# Patient Record
Sex: Female | Born: 1935
Health system: Southern US, Community
[De-identification: ages and names within clinical notes are randomized; demographics above are authoritative.]

## PROBLEM LIST (undated history)

## (undated) DIAGNOSIS — H269 Unspecified cataract: Secondary | ICD-10-CM

## (undated) DIAGNOSIS — C801 Malignant (primary) neoplasm, unspecified: Secondary | ICD-10-CM

## (undated) DIAGNOSIS — E119 Type 2 diabetes mellitus without complications: Secondary | ICD-10-CM

## (undated) DIAGNOSIS — K219 Gastro-esophageal reflux disease without esophagitis: Secondary | ICD-10-CM

## (undated) DIAGNOSIS — I10 Essential (primary) hypertension: Secondary | ICD-10-CM

## (undated) DIAGNOSIS — M1990 Unspecified osteoarthritis, unspecified site: Secondary | ICD-10-CM

## (undated) HISTORY — DX: Unspecified osteoarthritis, unspecified site: M19.90

## (undated) HISTORY — DX: Type 2 diabetes mellitus without complications: E11.9

## (undated) HISTORY — PX: ABDOMINAL HYSTERECTOMY: SHX81

## (undated) HISTORY — DX: Malignant (primary) neoplasm, unspecified: C80.1

## (undated) HISTORY — PX: SPINE SURGERY: SHX786

## (undated) HISTORY — DX: Unspecified cataract: H26.9

## (undated) HISTORY — DX: Gastro-esophageal reflux disease without esophagitis: K21.9

## (undated) HISTORY — PX: EYE SURGERY: SHX253

## (undated) HISTORY — PX: TONSILLECTOMY: SUR1361

---

## 1999-10-16 HISTORY — PX: OTHER SURGICAL HISTORY: SHX169

## 2006-12-25 ENCOUNTER — Ambulatory Visit: Payer: Self-pay | Admitting: Family Medicine

## 2007-02-03 ENCOUNTER — Encounter: Payer: Self-pay | Admitting: Family Medicine

## 2007-02-03 ENCOUNTER — Other Ambulatory Visit: Admission: RE | Admit: 2007-02-03 | Discharge: 2007-02-03 | Payer: Self-pay | Admitting: Family Medicine

## 2007-02-03 ENCOUNTER — Ambulatory Visit: Payer: Self-pay | Admitting: Family Medicine

## 2007-02-03 DIAGNOSIS — N63 Unspecified lump in unspecified breast: Secondary | ICD-10-CM | POA: Insufficient documentation

## 2007-02-03 DIAGNOSIS — E7800 Pure hypercholesterolemia, unspecified: Secondary | ICD-10-CM | POA: Insufficient documentation

## 2007-02-03 DIAGNOSIS — R030 Elevated blood-pressure reading, without diagnosis of hypertension: Secondary | ICD-10-CM | POA: Insufficient documentation

## 2007-02-03 DIAGNOSIS — N951 Menopausal and female climacteric states: Secondary | ICD-10-CM | POA: Insufficient documentation

## 2007-02-04 ENCOUNTER — Encounter: Payer: Self-pay | Admitting: Family Medicine

## 2007-02-04 LAB — CONVERTED CEMR LAB
ALT: 15 units/L (ref 0–35)
BUN: 13 mg/dL (ref 6–23)
CO2: 26 meq/L (ref 19–32)
Calcium: 8.9 mg/dL (ref 8.4–10.5)
Cholesterol: 198 mg/dL (ref 0–200)
Creatinine, Ser: 0.73 mg/dL (ref 0.40–1.20)
Glucose, Bld: 138 mg/dL — ABNORMAL HIGH (ref 70–99)
HDL: 40 mg/dL (ref >39)
Total Bilirubin: 0.4 mg/dL (ref 0.3–1.2)
Total CHOL/HDL Ratio: 5
Triglycerides: 184 mg/dL — ABNORMAL HIGH (ref <150)
VLDL: 37 mg/dL (ref 0–40)

## 2007-02-05 ENCOUNTER — Encounter: Payer: Self-pay | Admitting: Family Medicine

## 2007-02-11 ENCOUNTER — Encounter: Admission: RE | Admit: 2007-02-11 | Discharge: 2007-02-11 | Payer: Self-pay | Admitting: Family Medicine

## 2007-02-19 ENCOUNTER — Ambulatory Visit: Payer: Self-pay | Admitting: Family Medicine

## 2007-02-19 DIAGNOSIS — E118 Type 2 diabetes mellitus with unspecified complications: Secondary | ICD-10-CM | POA: Insufficient documentation

## 2007-02-19 DIAGNOSIS — E119 Type 2 diabetes mellitus without complications: Secondary | ICD-10-CM

## 2007-03-06 ENCOUNTER — Encounter: Admission: RE | Admit: 2007-03-06 | Discharge: 2007-06-04 | Payer: Self-pay | Admitting: Family Medicine

## 2007-03-13 ENCOUNTER — Ambulatory Visit: Payer: Self-pay | Admitting: Family Medicine

## 2007-04-21 ENCOUNTER — Ambulatory Visit: Payer: Self-pay | Admitting: Family Medicine

## 2007-04-21 LAB — CONVERTED CEMR LAB

## 2007-06-09 ENCOUNTER — Ambulatory Visit: Payer: Self-pay | Admitting: Family Medicine

## 2007-08-12 ENCOUNTER — Ambulatory Visit: Payer: Self-pay | Admitting: Family Medicine

## 2007-09-05 ENCOUNTER — Encounter: Payer: Self-pay | Admitting: Family Medicine

## 2008-02-04 ENCOUNTER — Ambulatory Visit: Payer: Self-pay | Admitting: Family Medicine

## 2008-02-04 LAB — CONVERTED CEMR LAB
Glucose, Bld: 95 mg/dL
Microalbumin U total vol: 10 mg/L

## 2008-03-09 ENCOUNTER — Encounter: Admission: RE | Admit: 2008-03-09 | Discharge: 2008-03-09 | Payer: Self-pay | Admitting: Family Medicine

## 2008-03-25 ENCOUNTER — Ambulatory Visit: Payer: Self-pay | Admitting: Family Medicine

## 2008-03-25 DIAGNOSIS — L03019 Cellulitis of unspecified finger: Secondary | ICD-10-CM | POA: Insufficient documentation

## 2008-08-06 ENCOUNTER — Ambulatory Visit: Payer: Self-pay | Admitting: Family Medicine

## 2008-08-10 ENCOUNTER — Encounter: Payer: Self-pay | Admitting: Family Medicine

## 2008-08-11 LAB — CONVERTED CEMR LAB
Alkaline Phosphatase: 81 units/L (ref 39–117)
BUN: 12 mg/dL (ref 6–23)
CO2: 25 meq/L (ref 19–32)
Cholesterol: 195 mg/dL (ref 0–200)
Glucose, Bld: 117 mg/dL — ABNORMAL HIGH (ref 70–99)
HDL: 41 mg/dL (ref >39)
LDL Cholesterol: 125 mg/dL — ABNORMAL HIGH (ref 0–99)
Total Bilirubin: 0.4 mg/dL (ref 0.3–1.2)
Total Protein: 6.7 g/dL (ref 6.0–8.3)
Triglycerides: 145 mg/dL (ref <150)
VLDL: 29 mg/dL (ref 0–40)

## 2008-08-30 ENCOUNTER — Telehealth: Payer: Self-pay | Admitting: Family Medicine

## 2008-09-06 ENCOUNTER — Telehealth: Payer: Self-pay | Admitting: Family Medicine

## 2008-11-05 ENCOUNTER — Ambulatory Visit: Payer: Self-pay | Admitting: Family Medicine

## 2009-02-03 ENCOUNTER — Encounter: Payer: Self-pay | Admitting: Family Medicine

## 2009-02-03 LAB — HM DIABETES EYE EXAM: HM Diabetic Eye Exam: NORMAL

## 2009-02-07 ENCOUNTER — Ambulatory Visit: Payer: Self-pay | Admitting: Family Medicine

## 2009-02-07 DIAGNOSIS — R21 Rash and other nonspecific skin eruption: Secondary | ICD-10-CM | POA: Insufficient documentation

## 2009-02-07 LAB — HM DIABETES FOOT EXAM

## 2009-02-07 LAB — CONVERTED CEMR LAB
Creatinine, Urine: 26.2 mg/dL
Microalb Creat Ratio: 19.1 mg/g (ref 0.0–30.0)

## 2009-02-08 ENCOUNTER — Encounter: Payer: Self-pay | Admitting: Family Medicine

## 2009-02-09 LAB — CONVERTED CEMR LAB
ALT: 14 units/L (ref 0–35)
AST: 15 units/L (ref 0–37)
Albumin: 4.2 g/dL (ref 3.5–5.2)
BUN: 13 mg/dL (ref 6–23)
CO2: 24 meq/L (ref 19–32)
Calcium: 9.2 mg/dL (ref 8.4–10.5)
Chloride: 103 meq/L (ref 96–112)
Cholesterol: 197 mg/dL (ref 0–200)
Creatinine, Ser: 0.66 mg/dL (ref 0.40–1.20)
Potassium: 4.5 meq/L (ref 3.5–5.3)

## 2009-04-04 ENCOUNTER — Telehealth (INDEPENDENT_AMBULATORY_CARE_PROVIDER_SITE_OTHER): Payer: Self-pay | Admitting: *Deleted

## 2009-04-04 ENCOUNTER — Encounter: Admission: RE | Admit: 2009-04-04 | Discharge: 2009-04-04 | Payer: Self-pay | Admitting: Family Medicine

## 2009-06-09 ENCOUNTER — Ambulatory Visit: Payer: Self-pay | Admitting: Family Medicine

## 2009-06-09 LAB — CONVERTED CEMR LAB
Hgb A1c MFr Bld: 6.7 %
Hgb A1c MFr Bld: 6.7 %
Microalbumin U total vol: 30 mg/L

## 2009-06-23 ENCOUNTER — Encounter: Payer: Self-pay | Admitting: Family Medicine

## 2009-06-27 LAB — CONVERTED CEMR LAB
ALT: 16 units/L (ref 0–35)
AST: 19 units/L (ref 0–37)
Alkaline Phosphatase: 83 units/L (ref 39–117)
Cholesterol: 138 mg/dL (ref 0–200)
Creatinine, Ser: 0.69 mg/dL (ref 0.40–1.20)
LDL Cholesterol: 57 mg/dL (ref 0–99)
Sodium: 141 meq/L (ref 135–145)
Total Bilirubin: 0.4 mg/dL (ref 0.3–1.2)
Total CHOL/HDL Ratio: 3.3
Total Protein: 7 g/dL (ref 6.0–8.3)
VLDL: 39 mg/dL (ref 0–40)

## 2009-07-07 ENCOUNTER — Encounter: Payer: Self-pay | Admitting: Family Medicine

## 2009-09-06 ENCOUNTER — Ambulatory Visit: Payer: Self-pay | Admitting: Family Medicine

## 2009-09-12 ENCOUNTER — Ambulatory Visit: Payer: Self-pay | Admitting: Family Medicine

## 2009-09-12 LAB — CONVERTED CEMR LAB: Hgb A1c MFr Bld: 6.3 %

## 2009-12-20 ENCOUNTER — Ambulatory Visit: Payer: Self-pay | Admitting: Family Medicine

## 2009-12-20 LAB — CONVERTED CEMR LAB: Hgb A1c MFr Bld: 6.1 %

## 2010-06-30 ENCOUNTER — Ambulatory Visit: Payer: Self-pay | Admitting: Family Medicine

## 2010-06-30 DIAGNOSIS — J069 Acute upper respiratory infection, unspecified: Secondary | ICD-10-CM | POA: Insufficient documentation

## 2010-06-30 LAB — CONVERTED CEMR LAB: Hgb A1c MFr Bld: 6.5 %

## 2010-07-31 ENCOUNTER — Telehealth: Payer: Self-pay | Admitting: Family Medicine

## 2010-09-28 ENCOUNTER — Encounter: Payer: Self-pay | Admitting: Family Medicine

## 2010-10-02 LAB — CONVERTED CEMR LAB
Albumin: 4.3 g/dL (ref 3.5–5.2)
Alkaline Phosphatase: 68 units/L (ref 39–117)
BUN: 13 mg/dL (ref 6–23)
Creatinine, Ser: 0.65 mg/dL (ref 0.40–1.20)
Glucose, Bld: 106 mg/dL — ABNORMAL HIGH (ref 70–99)
HDL: 47 mg/dL (ref >39)
LDL Cholesterol: 95 mg/dL (ref 0–99)
Potassium: 4.4 meq/L (ref 3.5–5.3)
Total CHOL/HDL Ratio: 3.4
Triglycerides: 102 mg/dL (ref <150)

## 2010-11-05 ENCOUNTER — Encounter: Payer: Self-pay | Admitting: Family Medicine

## 2010-11-14 NOTE — Assessment & Plan Note (Signed)
Summary: URI, DM   Vital Signs:  Patient profile:   77 year old female Height:      66.5 inches Weight:      240 pounds O2 Sat:      96 % on Room air Temp:     98.3 degrees F oral Pulse rate:   87 / minute BP sitting:   133 / 61  (left arm) Cuff size:   large  Vitals Entered By: Kathlene November (June 30, 2010 10:09 AM)  O2 Flow:  Room air CC: head congestion, drainage, cough x 3 days- been using Nyquil and Zycam- feels better today Flu Vaccine Consent Questions     Do you have a history of severe allergic reactions to this vaccine? no    Any prior history of allergic reactions to egg and/or gelatin? no    Do you have a sensitivity to the preservative Thimersol? no    Do you have a past history of Guillan-Barre Syndrome? no    Do you currently have an acute febrile illness? no    Have you ever had a severe reaction to latex? no    Vaccine information given and explained to patient? yes    Are you currently pregnant? no    Lot Number:[id number]   Exp Date:04/14/2011   Site Given  Left Deltoid IM   Primary Care Provider:  Linford Arnold, C  CC:  head congestion, drainage, and cough x 3 days- been using Nyquil and Zycam- feels better today.  History of Present Illness: head congestion, drainage, cough x 3 days- been using Nyquil and Zycam- feels better today. No SOB or chest pain. Feels sore in her chest. No fever. No GI sxs. No alleviating or exacerbating sxs.   Diabetes Management History:      The patient is a 77 years old female who comes in for evaluation of Type 2 Diabetes Mellitus.  She is (or has been) enrolled in the "Diabetic Education Program".  She states understanding of dietary principles and is following her diet appropriately.  Self foot exams are being performed.  She is checking home blood sugars.  She says that she is not exercising regularly.        Hypoglycemic symptoms are not occurring.  No hyperglycemic symptoms are reported.  Other comments include: Fasting  sugars in July 110-130.  In August sugars 140-150. Marland Kitchen        There are no symptoms to suggest diabetic complications.  No changes have been made to her treatment plan since last visit.    Current Medications (verified): 1)  Accu-Check Aviva Strips and Lancets. .... Use As Directed. 2)  Metformin Hcl 1000 Mg Tabs (Metformin Hcl) .... Take 1 Tablet By Mouth Two Times A Day 3)  Accuchek Multiclick Lancets 102 .... As Directed 4)  Pravachol 40 Mg Tabs (Pravastatin Sodium) .... Take 1 Tablet By Mouth Once A Day At Bedtime For Cholesterol 5)  Fish Oil 1000 Mg Caps (Omega-3 Fatty Acids) .Marland Kitchen.. 1 Tab A Day By Mouth  Allergies (verified): No Known Drug Allergies  Comments:  Nurse/Medical Assistant: The patient's medications and allergies were reviewed with the patient and were updated in the Medication and Allergy Lists. Kathlene November (June 30, 2010 10:10 AM)  Social History: Teaching tax classes  Recently moved back to [place] where she is originally from.  Has lived in [place] for last 20 + years.  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  Divorced.   Former Smoker Alcohol use-no Drug use-no Regular  exercise-no  Physical Exam  General:  Well-developed,well-nourished,in no acute distress; alert,appropriate and cooperative throughout examination Head:  Normocephalic and atraumatic without obvious abnormalities. No apparent alopecia or balding. Eyes:  No corneal or conjunctival inflammation noted. EOMI. Perrla. Ears:  External ear exam shows no significant lesions or deformities.  Otoscopic examination reveals clear canals, tympanic membranes are intact bilaterally without bulging, retraction, inflammation or discharge. Hearing is grossly normal bilaterally. Nose:  External nasal examination shows no deformity or inflammation. Mouth:  Oral mucosa and oropharynx without lesions or exudates.  Teeth in good repair. Neck:  No deformities, masses, or tenderness noted. Lungs:  Normal respiratory effort, chest  expands symmetrically. Lungs are clear to auscultation, no crackles or wheezes. Heart:  Normal rate and regular rhythm. S1 and S2 normal without gallop, murmur, click, rub or other extra sounds. Skin:  no rashes.   Cervical Nodes:  No lymphadenopathy noted Psych:  Cognition and judgment appear intact. Alert and cooperative with normal attention span and concentration. No apparent delusions, illusions, hallucinations   Impression & Recommendations:  Problem # 1:  URI (ICD-465.9) Assessment [place] viral. Call if not better in one week.  Instructed on symptomatic treatment. Call if symptoms persist or worsen.   Problem # 2:  DIABETES MELLITUS, CONTROLLED (ICD-250.00) AT goal today. Her sugars have been running higher the last month. She has not been walking. When she is walking her sugars are much better.  f/u in 3-4 months for DM. Will continue current regimen.  Given flu shot today.  Reminded to get her eye exam.   Her updated medication list for this problem includes:    Metformin Hcl 1000 Mg Tabs (Metformin hcl) .Marland Kitchen... Take 1 tablet by mouth two times a day  Orders: T-Comprehensive Metabolic Panel [id number]) T-Lipid Profile ([id number]) Fingerstick (36416) Hgb A1C (65784ON)  Problem # 3:  HYPERCHOLESTEROLEMIA, PURE (ICD-272.0)  Her updated medication list for this problem includes:    Pravachol 40 Mg Tabs (Pravastatin sodium) .Marland Kitchen... Take 1 tablet by mouth once a day at bedtime for cholesterol  Labs Reviewed: SGOT: 19 (06/23/2009)   SGPT: 16 (06/23/2009)   HDL:42 (06/23/2009), 38 (02/08/2009)  LDL:57 (06/23/2009), 111 (62/95/2841)  Chol:138 (06/23/2009), 197 (02/08/2009)  Trig:196 (06/23/2009), 238 (02/08/2009)  Orders: T-Comprehensive Metabolic Panel ([id number]) T-Lipid Profile ([id number])  Complete Medication List: 1)  Accu-check Aviva Strips and Lancets.  .... Use as directed. 2)  Metformin Hcl 1000 Mg Tabs (Metformin hcl) .... Take 1 tablet by mouth two times a  day 3)  Accuchek Multiclick Lancets 102  .... As directed 4)  Pravachol 40 Mg Tabs (Pravastatin sodium) .... Take 1 tablet by mouth once a day at bedtime for cholesterol 5)  Fish Oil 1000 Mg Caps (Omega-3 fatty acids) .Marland Kitchen.. 1 tab a day by mouth 6)  Zostavax 42595 Unt/0.14ml Solr (Zoster vaccine live) .... Inject one dose im  Other Orders: Flu Vaccine 86yrs + MEDICARE PATIENTS (G3875) Administration Flu vaccine - MCR (I4332)  Diabetes Management Assessment/Plan:      Her blood pressure goal is < 130/80.    Patient Instructions: 1)  Call if not better in one week 2)  Encourage regular walking.   3)  Rember to get your eye exam.  4)  You received your flu shot today.  5)  Think about getting your shingles vaccines.  See the handout.  Prescriptions: ZOSTAVAX [id number] UNT/0.65ML SOLR (ZOSTER VACCINE LIVE) inject one dose IM  #1 x 0   Entered and Authorized by:   [name] Evans  Methe[place] MD   Signed by:   Nani Gasser MD on 06/30/2010   Method used:   Print then Give to Patient   RxID:   [id number]      Laboratory Results   Blood Tests   Date/Time Received: 06/30/2010 Date/Time Reported: 06/30/2010  HGBA1C: 6.5%   (Normal Range: Non-Diabetic - 3-6%   Control Diabetic - 6-8%)

## 2010-11-14 NOTE — Progress Notes (Signed)
Summary: meds  Phone Note Call from Patient Call back at Home Phone [phone]   Caller: Patient Call For: Nani Gasser MD Summary of Call: Would like to try the combo med for Metformin that yall had discussed. Also needs rx for Pravastatin 40mg . Send both of these to [facility] in [place].  Needs test strips for Accu chek and accu chek multi click lancets to [facility] on [address] in [place] Initial call taken by: Kathlene November LPN,  July 31, 2010 1:07 PM    New/Updated Medications: JANUMET 50-1000 MG TABS (SITAGLIPTIN-METFORMIN HCL) Take 1 tablet by mouth two times a day Prescriptions: JANUMET 50-1000 MG TABS (SITAGLIPTIN-METFORMIN HCL) Take 1 tablet by mouth two times a day  #60 x 1   Entered and Authorized by:   Nani Gasser MD   Signed by:   Nani Gasser MD on 07/31/2010   Method used:   Electronically to        [facility]* (retail)       [address] [place], [place]  [address]       Ph: [phone]       Fax: [phone]   RxID:   [id number] ACCU-CHECK AVIVA STRIPS AND LANCETS. Use as directed.  #90 day supp x PRN   Entered and Authorized by:   Nani Gasser MD   Signed by:   Nani Gasser MD on 07/31/2010   Method used:   Printed then faxed to ...       [facility] [phone]* (retail)       [address] [place], [place]  [address]       Ph: [phone]       Fax: [phone]   RxID:   [id number] ACCUCHEK MULTICLICK LANCETS 102 as directed  #1 box x 12   Entered and Authorized by:   Nani Gasser MD   Signed by:   Nani Gasser MD on 07/31/2010   Method used:   Printed then faxed to ...       [facility] [phone]* (retail)       [address] [place], [place]  [address]       Ph: [phone]       Fax: [phone]   RxID:   [id number] PRAVACHOL 40 MG TABS (PRAVASTATIN SODIUM) Take 1 tablet by mouth once a day at bedtime for cholesterol  #90.0 Each x 3   Entered and Authorized by:    Nani Gasser MD   Signed by:   Nani Gasser MD on 07/31/2010   Method used:   Electronically to        [facility]* (retail)       [address] [place], [place]  [address]       Ph: [phone]       Fax: [phone]   RxID:   [id number]

## 2010-11-14 NOTE — Assessment & Plan Note (Signed)
Summary: 4 MONTH FU DM, HTN   Vital Signs:  Patient profile:   77 year old female Height:      66.5 inches Weight:      237 pounds BMI:     37.82 Pulse rate:   86 / minute BP sitting:   133 / 60  (left arm) Cuff size:   large  Vitals Entered By: Kathlene November (December 20, 2009 3:25 PM) CC: follow-up diabetes Is Patient Diabetic? Yes Did you bring your meter with you today? Yes   Primary Care Provider:  Linford Arnold, C  CC:  follow-up diabetes.  History of Present Illness: Has lost some weight. Has been working on this. 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   Diabetes Management History:      The patient is a 77 years old female who comes in for evaluation of Type 2 Diabetes Mellitus.  She is (or has been) enrolled in the "Diabetic Education Program".  She states understanding of dietary principles.  No sensory loss is reported.  Self foot exams are being performed.  She is checking home blood sugars.  She says that she is not exercising regularly.        Hypoglycemic symptoms are not occurring.  No hyperglycemic symptoms are reported.        Since her last visit, no infections have occurred.        Her home blood sugars include fasting blood sugars: highest: 155, lowest: 119.    Current Medications (verified): 1)  Accu-Check Aviva Strips and Lancets. .... Use As Directed. 2)  Metformin Hcl 1000 Mg Tabs (Metformin Hcl) .... Take 1 Tablet By Mouth Two Times A Day 3)  Accuchek Multiclick Lancets 102 .... As Directed 4)  Pravachol 40 Mg Tabs (Pravastatin Sodium) .... Take 1 Tablet By Mouth Once A Day At Bedtime For Cholesterol 5)  Fish Oil 1000 Mg Caps (Omega-3 Fatty Acids) .Marland Kitchen.. 1 Tab A Day By Mouth  Allergies (verified): No Known Drug Allergies  Comments:  Nurse/Medical Assistant: The patient's medications and allergies were reviewed with the patient and were updated in the Medication and Allergy Lists. Kathlene November (December 20, 2009 3:26  PM)  Physical Exam  General:  Well-developed,well-nourished,in no acute distress; alert,appropriate and cooperative throughout examination Lungs:  Normal respiratory effort, chest expands symmetrically. Lungs are clear to auscultation, no crackles or wheezes. Heart:  Normal rate and regular rhythm. S1 and S2 normal without gallop, murmur, click, rub or other extra sounds. Skin:  no rashes.   Cervical Nodes:  No lymphadenopathy noted Psych:  Cognition and judgment appear intact. Alert and cooperative with normal attention span and concentration. No apparent delusions, illusions, hallucinations   Impression & Recommendations:  Problem # 1:  DIABETES MELLITUS, CONTROLLED (ICD-250.00) Reminded due for eye exam in April.   Continue current regimen.  Her updated medication list for this problem includes:    Metformin Hcl 1000 Mg Tabs (Metformin hcl) .Marland Kitchen... Take 1 tablet by mouth two times a day  Orders: Fingerstick (36416) Hemoglobin A1C (83036)  Problem # 2:  ABNORMAL FINDINGS, ELEVATED BP W/O HTN (ICD-796.2) Bp looks great today.  Weight loos has probably helped. Still continue to monitor.   Complete Medication List: 1)  Accu-check Aviva Strips and Lancets.  .... Use as directed. 2)  Metformin Hcl 1000 Mg Tabs (Metformin hcl) .... Take 1 tablet by mouth two times a day 3)  Accuchek Multiclick Lancets 102  .Marland KitchenMarland KitchenMarland Kitchen  As directed 4)  Pravachol 40 Mg Tabs (Pravastatin sodium) .... Take 1 tablet by mouth once a day at bedtime for cholesterol 5)  Fish Oil 1000 Mg Caps (Omega-3 fatty acids) .Marland Kitchen.. 1 tab a day by mouth  Diabetes Management Assessment/Plan:      Her blood pressure goal is < 130/80.    Laboratory Results   Blood Tests   Date/Time Received: 12/20/2009 Date/Time Reported: 12/20/2009  HGBA1C: 6.1%   (Normal Range: Non-Diabetic - 3-6%   Control Diabetic - 6-8%)

## 2011-01-12 ENCOUNTER — Other Ambulatory Visit: Payer: Self-pay | Admitting: Family Medicine

## 2011-01-12 NOTE — Telephone Encounter (Signed)
Pt needs appt

## 2011-01-26 ENCOUNTER — Encounter: Payer: Self-pay | Admitting: Family Medicine

## 2011-02-01 ENCOUNTER — Encounter: Payer: Self-pay | Admitting: Family Medicine

## 2011-02-01 ENCOUNTER — Ambulatory Visit (INDEPENDENT_AMBULATORY_CARE_PROVIDER_SITE_OTHER): Payer: Self-pay | Admitting: Family Medicine

## 2011-02-01 DIAGNOSIS — E785 Hyperlipidemia, unspecified: Secondary | ICD-10-CM

## 2011-02-01 DIAGNOSIS — E119 Type 2 diabetes mellitus without complications: Secondary | ICD-10-CM

## 2011-02-01 LAB — POCT GLYCOSYLATED HEMOGLOBIN (HGB A1C): Hemoglobin A1C: 6.4

## 2011-02-01 LAB — POCT UA - MICROALBUMIN

## 2011-02-01 MED ORDER — METFORMIN HCL 500 MG PO TABS: 500.0000 mg | ORAL_TABLET | Freq: Two times a day (BID) | ORAL | Status: DC

## 2011-02-01 NOTE — Progress Notes (Signed)
  Subjective:    Patient ID: Diana Parsons, female    DOB: 1934-02-08, 77 y.o.   MRN: [id number]  Diabetes She presents for her follow-up diabetic visit. She has type 2 diabetes mellitus. Pertinent negatives for hypoglycemia include no dizziness, speech difficulty, sweats or tremors. Pertinent negatives for diabetes include no blurred vision, no foot ulcerations, no polydipsia and no polyuria. Symptoms are stable. There are no diabetic complications. When asked about current treatments, none were reported. She is compliant with treatment most of the time. Her breakfast blood glucose range is generally 110-130 mg/dl.  Ran out of medications about 2 weeks ago. Sugar were low on the medication. Would like to see how she does without it. She has lost weight. She still works a couple of days a week. P[lans on starting a walking routine.      Review of Systems  Eyes: Negative for blurred vision.  Genitourinary: Negative for polyuria.  Neurological: Negative for dizziness, tremors and speech difficulty.  Hematological: Negative for polydipsia.       Objective:   Physical Exam  Constitutional: She appears well-developed and well-nourished.  HENT:  Head: Normocephalic and atraumatic.  Cardiovascular: Normal rate, regular rhythm and normal heart sounds.   Pulmonary/Chest: Effort normal and breath sounds normal.  Musculoskeletal: She exhibits no edema.  Skin: Skin is warm.  Psychiatric: She has a normal mood and affect.          Assessment & Plan:

## 2011-02-01 NOTE — Assessment & Plan Note (Signed)
Discussed she is doing so well. We will Dec her meds to just Metformin. Her microalbumin is neg today.  Fott exam was normal today.  F/u in 3 months since we are reducing her med. Encourage regular exercise.

## 2011-02-14 LAB — LIPID PANEL
LDL Cholesterol: 136 mg/dL — ABNORMAL HIGH (ref 0–99)
VLDL: 23 mg/dL (ref 0–40)

## 2011-02-15 ENCOUNTER — Telehealth: Payer: Self-pay | Admitting: Family Medicine

## 2011-02-15 LAB — COMPLETE METABOLIC PANEL WITH GFR
ALT: 12 U/L (ref 0–35)
AST: 15 U/L (ref 0–37)
Albumin: 4.2 g/dL (ref 3.5–5.2)
BUN: 13 mg/dL (ref 6–23)
CO2: 25 mEq/L (ref 19–32)
Calcium: 9.4 mg/dL (ref 8.4–10.5)
Chloride: 106 mEq/L (ref 96–112)
Potassium: 4.7 mEq/L (ref 3.5–5.3)

## 2011-02-15 MED ORDER — PRAVASTATIN SODIUM 80 MG PO TABS: 40.0000 mg | ORAL_TABLET | Freq: Every day | ORAL | Status: DC

## 2011-02-15 NOTE — Telephone Encounter (Signed)
Pt notified via vm

## 2011-02-15 NOTE — Telephone Encounter (Signed)
Pt.notified

## 2011-02-15 NOTE — Telephone Encounter (Signed)
Call pt:   LDL is up. Will in the pravastatin to 80mg  nightly.

## 2011-03-13 ENCOUNTER — Other Ambulatory Visit: Payer: Self-pay | Admitting: Family Medicine

## 2011-03-13 MED ORDER — PRAVASTATIN SODIUM 80 MG PO TABS: 80.0000 mg | ORAL_TABLET | Freq: Every day | ORAL | Status: DC

## 2011-05-11 ENCOUNTER — Encounter: Payer: Self-pay | Admitting: Family Medicine

## 2011-05-11 ENCOUNTER — Ambulatory Visit (INDEPENDENT_AMBULATORY_CARE_PROVIDER_SITE_OTHER): Payer: BC Managed Care – PPO | Admitting: Family Medicine

## 2011-05-11 DIAGNOSIS — E7800 Pure hypercholesterolemia, unspecified: Secondary | ICD-10-CM

## 2011-05-11 DIAGNOSIS — E119 Type 2 diabetes mellitus without complications: Secondary | ICD-10-CM

## 2011-05-11 MED ORDER — AMBULATORY NON FORMULARY MEDICATION: Status: DC

## 2011-05-11 MED ORDER — PRAVASTATIN SODIUM 80 MG PO TABS: 80.0000 mg | ORAL_TABLET | Freq: Every day | ORAL | Status: DC

## 2011-05-11 NOTE — Progress Notes (Signed)
  Subjective:    Patient ID: Diana Parsons, female    DOB: 08/29/1934, 77 y.o.   MRN: [id number]  Diabetes She presents for her follow-up diabetic visit. She has type 2 diabetes mellitus. Her disease course has been improving. There are no hypoglycemic associated symptoms. Pertinent negatives for diabetes include no chest pain. There are no hypoglycemic complications. Symptoms are stable. There are no diabetic complications. Current diabetic treatment includes oral agent (monotherapy). She is compliant with treatment all of the time. She has had a previous visit with a dietician. She participates in exercise intermittently. Her breakfast blood glucose range is generally 110-130 mg/dl. An ACE inhibitor/angiotensin II receptor blocker is not being taken. Eye exam is current.   She needs her cholesterol medication corrected. We increased her dose to 80 mg but the prescriptions to that high potassium. We will send a new prescription and recheck her cholesterol in 8 weeks.  Review of Systems  Cardiovascular: Negative for chest pain.       Objective:   Physical Exam  Constitutional: She is oriented to person, place, and time. She appears well-developed and well-nourished.  HENT:  Head: Normocephalic and atraumatic.  Cardiovascular: Normal rate, regular rhythm and normal heart sounds.   Pulmonary/Chest: Effort normal and breath sounds normal.  Neurological: She is alert and oriented to person, place, and time.  Skin: Skin is warm and dry.  Psychiatric: She has a normal mood and affect. Her behavior is normal.          Assessment & Plan:  I discussed the importance of getting additional Wilson Singer gave her a prescription to get it at the pharmacy today. No problem-specific assessment & plan notes found for this encounter.

## 2011-05-11 NOTE — Assessment & Plan Note (Signed)
Once starts whole tab we can recheck her cholesterol in 2 months.

## 2011-05-11 NOTE — Assessment & Plan Note (Signed)
Her A1c looks great today she is doing better well. Her monitor to get her eye exam in the fall. Followup in 4 months. She had a normal monofilament exam today. Continue current regimen.

## 2011-05-11 NOTE — Patient Instructions (Addendum)
Can try miralax  Up to twice a day for your bowels.    Once starts whole tab we can recheck your cholesterol in 2 months.

## 2011-08-14 ENCOUNTER — Encounter: Payer: Self-pay | Admitting: Family Medicine

## 2011-08-14 ENCOUNTER — Ambulatory Visit (INDEPENDENT_AMBULATORY_CARE_PROVIDER_SITE_OTHER): Payer: BC Managed Care – PPO | Admitting: Family Medicine

## 2011-08-14 DIAGNOSIS — Z79899 Other long term (current) drug therapy: Secondary | ICD-10-CM

## 2011-08-14 DIAGNOSIS — E119 Type 2 diabetes mellitus without complications: Secondary | ICD-10-CM

## 2011-08-14 DIAGNOSIS — Z23 Encounter for immunization: Secondary | ICD-10-CM

## 2011-08-14 DIAGNOSIS — E785 Hyperlipidemia, unspecified: Secondary | ICD-10-CM

## 2011-08-14 LAB — POCT GLYCOSYLATED HEMOGLOBIN (HGB A1C): Hemoglobin A1C: 6.5

## 2011-08-14 MED ORDER — AMBULATORY NON FORMULARY MEDICATION: Status: DC

## 2011-08-14 NOTE — Patient Instructions (Signed)
Try to get your shingles vaccine.   

## 2011-08-14 NOTE — Progress Notes (Signed)
  Subjective:    Patient ID: Diana Parsons, female    DOB: May 15, 1934, 77 y.o.   MRN: [id number]  Diabetes She presents for her follow-up diabetic visit. She has type 2 diabetes mellitus. Her disease course has been stable. There are no hypoglycemic associated symptoms. Pertinent negatives for diabetes include no blurred vision, no foot paresthesias, no polydipsia, no polyphagia, no polyuria, no visual change and no weight loss. Symptoms are stable. Her weight is stable. She is following a generally healthy diet. She has not had a previous visit with a dietician. She participates in exercise weekly. Her breakfast blood glucose range is generally 130-140 mg/dl. She does not see a podiatrist.Eye exam is current.    Hyperlipidemia-she is doing well on increased dose of Pravachol. No side effects or myalgias.  Review of Systems  Constitutional: Negative for weight loss.  Eyes: Negative for blurred vision.  Genitourinary: Negative for polyuria.  Hematological: Negative for polydipsia and polyphagia.       Objective:   Physical Exam  Constitutional: She is oriented to person, place, and time. She appears well-developed and well-nourished.  HENT:  Head: Normocephalic and atraumatic.  Eyes: Conjunctivae are normal. Pupils are equal, round, and reactive to light.  Cardiovascular: Normal rate, regular rhythm and normal heart sounds.   Pulmonary/Chest: Effort normal and breath sounds normal.  Neurological: She is alert and oriented to person, place, and time.  Skin: Skin is warm and dry.  Psychiatric: She has a normal mood and affect. Her behavior is normal.          Assessment & Plan:  DM- Well controlled. Her preventative care i s up to date. She takes a daily ASA. F.U in 4 months. Continue current regimen. Make sure getting regular exercise and working on her weight.  BP is well controlled today.    Hyperlipidemia- Doing well on inc dose of pravachol. No s.e. Or myalgias.  Due to  recheck LDL and liver. Given lab slip  Reminded her to get shingles vaccine. Reprinted slip. Flu vaccine given today

## 2011-09-15 LAB — HEPATIC FUNCTION PANEL
ALT: 13 U/L (ref 0–35)
AST: 15 U/L (ref 0–37)
Alkaline Phosphatase: 78 U/L (ref 39–117)
Indirect Bilirubin: 0.3 mg/dL (ref 0.0–0.9)
Total Protein: 6.6 g/dL (ref 6.0–8.3)

## 2011-09-19 ENCOUNTER — Other Ambulatory Visit: Payer: Self-pay | Admitting: Family Medicine

## 2011-10-01 ENCOUNTER — Other Ambulatory Visit: Payer: Self-pay | Admitting: Family Medicine

## 2011-10-01 ENCOUNTER — Ambulatory Visit
Admission: RE | Admit: 2011-10-01 | Discharge: 2011-10-01 | Disposition: A | Discharge status: Home / Self Care | Payer: BC Managed Care – PPO | Source: Ambulatory Visit | Attending: Orthopedic Surgery | Admitting: Orthopedic Surgery

## 2011-10-01 ENCOUNTER — Other Ambulatory Visit: Payer: Self-pay | Admitting: Orthopedic Surgery

## 2011-10-01 DIAGNOSIS — M25561 Pain in right knee: Secondary | ICD-10-CM

## 2011-12-10 ENCOUNTER — Ambulatory Visit: Payer: BC Managed Care – PPO | Admitting: Family Medicine

## 2012-02-19 ENCOUNTER — Other Ambulatory Visit: Payer: Self-pay | Admitting: Family Medicine

## 2012-03-11 ENCOUNTER — Encounter: Payer: Self-pay | Admitting: Family Medicine

## 2012-03-11 ENCOUNTER — Ambulatory Visit (INDEPENDENT_AMBULATORY_CARE_PROVIDER_SITE_OTHER): Payer: BC Managed Care – PPO | Admitting: Family Medicine

## 2012-03-11 VITALS — BP 127/68 | HR 78 | Ht 66.0 in | Wt 238.0 lb

## 2012-03-11 DIAGNOSIS — E785 Hyperlipidemia, unspecified: Secondary | ICD-10-CM

## 2012-03-11 DIAGNOSIS — Z9181 History of falling: Secondary | ICD-10-CM

## 2012-03-11 DIAGNOSIS — Z1331 Encounter for screening for depression: Secondary | ICD-10-CM

## 2012-03-11 DIAGNOSIS — IMO0001 Reserved for inherently not codable concepts without codable children: Secondary | ICD-10-CM

## 2012-03-11 LAB — POCT GLYCOSYLATED HEMOGLOBIN (HGB A1C): Hemoglobin A1C: 7.2

## 2012-03-11 MED ORDER — METFORMIN HCL ER 500 MG PO TB24: 500.0000 mg | ORAL_TABLET | Freq: Every day | ORAL | Status: DC

## 2012-03-11 NOTE — Patient Instructions (Signed)
Try to get your eye check up before I see you back.

## 2012-03-11 NOTE — Progress Notes (Addendum)
  Subjective:    Patient ID: Diana Parsons, female    DOB: Jun 05, 1934, 78 y.o.   MRN: [id number]  HPI DM - taking her medication daily. Sugars daily under 130. Hasn't been walking lately and admits needs to start again.  No hypoglycemia.  No cuts or sores on feet.  She is often forgetting her evening dose. She hasn't been walking for exercise lately.  She is overdue for her diabetic appointment. 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 Lab Results  Component Value Date   HGBA1C 6.5 08/14/2011     Review of Systems     Objective:   Physical Exam  Constitutional: She is oriented to person, place, and time. She appears well-developed and well-nourished.  HENT:  Head: Normocephalic and atraumatic.  Cardiovascular: Normal rate, regular rhythm and normal heart sounds.        No carotid bruit.    Pulmonary/Chest: Effort normal and breath sounds normal.  Neurological: She is alert and oriented to person, place, and time.  Skin: Skin is warm and dry.  Psychiatric: She has a normal mood and affect. Her behavior is normal.          Assessment & Plan:  DM-Uncontrolled.  A1C is up to 7.2.  Work on starting exercise program, walking.  F/U in 3 months.  Will change to extended release metformin once in the AM since forgetting PM dose.  Reminded her of the importance of getting a yearly eye exam. She is overdue. She says she will try schedule it sometime this summer. Lab Results  Component Value Date   HGBA1C 7.2 03/11/2012    Hyperlipidemia- due to recheck lipids since not quite at goal back in the fall.   Fall assessment-score of 2, low risk.  Depression screening-PHQ 9 score of 0, negative for depression.

## 2012-03-12 LAB — MICROALBUMIN / CREATININE URINE RATIO: Microalb, Ur: 1.43 mg/dL (ref 0.00–1.89)

## 2012-03-12 LAB — COMPLETE METABOLIC PANEL WITH GFR
ALT: 12 U/L (ref 0–35)
AST: 14 U/L (ref 0–37)
Albumin: 4.1 g/dL (ref 3.5–5.2)
Alkaline Phosphatase: 75 U/L (ref 39–117)
BUN: 11 mg/dL (ref 6–23)
Calcium: 8.9 mg/dL (ref 8.4–10.5)
Chloride: 104 mEq/L (ref 96–112)
Potassium: 4.6 mEq/L (ref 3.5–5.3)
Sodium: 140 mEq/L (ref 135–145)
Total Protein: 6.7 g/dL (ref 6.0–8.3)

## 2012-03-12 LAB — LIPID PANEL
Cholesterol: 211 mg/dL — ABNORMAL HIGH (ref 0–200)
Total CHOL/HDL Ratio: 5.4 Ratio
VLDL: 34 mg/dL (ref 0–40)

## 2012-03-13 ENCOUNTER — Other Ambulatory Visit: Payer: Self-pay | Admitting: *Deleted

## 2012-03-13 MED ORDER — ROSUVASTATIN CALCIUM 20 MG PO TABS: 20.0000 mg | ORAL_TABLET | Freq: Every day | ORAL | Status: DC

## 2012-05-08 ENCOUNTER — Telehealth: Payer: Self-pay | Admitting: *Deleted

## 2012-05-08 NOTE — Telephone Encounter (Signed)
OK to increase back to BID.  Can send new rx of needed.

## 2012-05-08 NOTE — Telephone Encounter (Signed)
Pt called and states her Diabetes medication that was rx'ed back in may was reduced to once a day. Pt states she thinks this is not working for her and her blood sugars have been elevated.Just looking at her med list I think she is referring to the metformin. Pt wants to go back to BID . Please advise

## 2012-05-09 ENCOUNTER — Ambulatory Visit (INDEPENDENT_AMBULATORY_CARE_PROVIDER_SITE_OTHER): Payer: BC Managed Care – PPO | Admitting: Family Medicine

## 2012-05-09 ENCOUNTER — Encounter: Payer: Self-pay | Admitting: Family Medicine

## 2012-05-09 VITALS — BP 118/67 | HR 77 | Resp 16 | Wt 243.0 lb

## 2012-05-09 DIAGNOSIS — M7660 Achilles tendinitis, unspecified leg: Secondary | ICD-10-CM

## 2012-05-09 DIAGNOSIS — IMO0001 Reserved for inherently not codable concepts without codable children: Secondary | ICD-10-CM

## 2012-05-09 MED ORDER — METFORMIN HCL ER (OSM) 1000 MG PO TB24: 1000.0000 mg | ORAL_TABLET | Freq: Every day | ORAL | Status: DC

## 2012-05-09 NOTE — Patient Instructions (Addendum)
Call me if your heel is not better in 2 weeks. Continue your Ibuprofen, ice and exercise.   Start home exercise.

## 2012-05-09 NOTE — Telephone Encounter (Signed)
Called pt and notified. Pt states she is coming in for an appt today at 2

## 2012-05-09 NOTE — Progress Notes (Signed)
  Subjective:    Patient ID: Diana Parsons, female    DOB: Jul 16, 1934, 78 y.o.   MRN: [id number]  HPI Left ankle started swelling about 4 days. Ago. Hurst from the knee down. Worse on the back of the heel. Feles really tight like a cord. Bothers her to sleep.  Better when wears a shoe with a heel.  No injury. PUtting ice on it.  Using Law[place]. Using IBU some - helps some.   DM - Sugar running higher since she switched to extended release metformin. She is producing taking 500 mg twice a day and she is now on the extended release metformin 500 mg tablet. She feels that her diet and exercise activity have  not changed.   Review of Systems     Objective:   Physical Exam  Musculoskeletal:       Left ankle with trace edema. No significant erythema. She does have a hard not over the back of the heel near the insertion of the Achilles tendon. She says that has been there for a long time. The she is very tender over this location. She has no pain over the Achilles tendon itself. She has normal flexion extension and range of motion of the ankle. Strength is 5 out of 5 in all directions. No excess laxity of the ankle joint. Great toe with normal strength. All toes with normal range of motion.          Assessment & Plan:  Achilles tendonitis-because she is tender at the insertion Achilles tendon I would like to give her some exercises for Achilles tendinitis. Continue to ice aggressively and use her ibuprofen over the next one to 2 weeks. Make sure taking with food and water to avoid any GI upset or irritation. If she is not improving then please call the office back and we will start with an x-ray and consider physical therapy.  Diabetes-I. will change her to the metformin 1000 and extended release product and see if this helps bring her sugar back down. The whole reason we changed her to the extended release was because she was frequently forgetting her evening dose of her metformin when it was written  as twice a day.

## 2012-05-28 ENCOUNTER — Encounter: Payer: Self-pay | Admitting: Sports Medicine

## 2012-05-28 ENCOUNTER — Ambulatory Visit (INDEPENDENT_AMBULATORY_CARE_PROVIDER_SITE_OTHER): Payer: BC Managed Care – PPO | Admitting: Sports Medicine

## 2012-05-28 ENCOUNTER — Ambulatory Visit (INDEPENDENT_AMBULATORY_CARE_PROVIDER_SITE_OTHER): Payer: BC Managed Care – PPO

## 2012-05-28 VITALS — BP 136/62 | HR 84 | Temp 97.8°F | Wt 244.0 lb

## 2012-05-28 DIAGNOSIS — M25569 Pain in unspecified knee: Secondary | ICD-10-CM

## 2012-05-28 DIAGNOSIS — M25869 Other specified joint disorders, unspecified knee: Secondary | ICD-10-CM

## 2012-05-28 DIAGNOSIS — M1712 Unilateral primary osteoarthritis, left knee: Secondary | ICD-10-CM | POA: Insufficient documentation

## 2012-05-28 DIAGNOSIS — M25469 Effusion, unspecified knee: Secondary | ICD-10-CM

## 2012-05-28 DIAGNOSIS — M25562 Pain in left knee: Secondary | ICD-10-CM

## 2012-05-28 MED ORDER — MELOXICAM 15 MG PO TABS: ORAL_TABLET | ORAL | Status: DC

## 2012-05-28 NOTE — Progress Notes (Addendum)
Patient ID: Diana Parsons, female   DOB: 11/24/1933, 78 y.o.   MRN: [id number] Subjective:    I'm seeing this patient as a consultation for:  Dr. Linford Arnold  CC: Left knee pain  HPI: Diana Parsons is an extremely pleasant 78 year old female, who comes in with complaints of left knee pain that she's had for several months now. She remembers having some pain along the medial joint line of her left knee, and then sitting with her knee bent, recently. This exacerbated the pain, and she developed some swelling. Since then she's noted a sharp pain with terminal flexion at the anteromedial joint line, and has also noted occasional buckling. Been using oral analgesics which had been fairly ineffective.  Left ankle: Of note, her left ankle, and Achilles pain has since resolved.  Past medical history, Surgical history, Family history, Social history, Allergies, and medications have been entered into the medical record, reviewed, and no changes needed.   Review of Systems: No headache, visual changes, nausea, vomiting, diarrhea, constipation, dizziness, abdominal pain, skin rash, fevers, chills, night sweats, weight loss, body aches, joint swelling, muscle aches, chest pain, or shortness of breath.   Objective:   Vitals:  Afebrile, vital signs stable. General: Well Developed, well nourished, and in no acute distress.  Neuro: Alert and oriented x3, extra-ocular muscles intact.  Skin: Warm and dry, no rashes noted.  Respiratory: Not using accessory muscles, speaking in full sentences.  Cardiovascular: Pulses palpable, no extremity edema. Left Knee: Normal to inspection with no erythema or effusion or obvious bony abnormalities. Tender to palpation along medial joint line. Full range of motion, no pain with terminal flexion. Ligaments with solid consistent endpoints including ACL, PCL, LCL, MCL. McMurray's with pain, but no pop. Non painful patellar compression. Patellar glide without crepitus. Patellar and  quadriceps tendons unremarkable. Hamstring and quadriceps strength is normal.   X-ray of the left knee was ordered, and interpreted by me. They show medial tibiofemoral joint DJD. This is moderate.  Procedure: Real-time Ultrasound Guided Injection of left tibiofemoral joint. Device: GE Logiq E  Ultrasound guided injection is preferred based studies that show increased duration, increased effect, greater accuracy, decreased procedural pain, increased response rate, and decreased cost with ultrasound guided versus blind injection.  Verbal informed consent obtained.  Time-out conducted.  Noted no overlying erythema, induration, or other signs of local infection.  Skin prepped in a sterile fashion.  Local anesthesia: Topical Ethyl chloride.  With sterile technique and under real time ultrasound guidance:  Needle advanced from a medial approach into fluid pocket. 2 cc Kenalog 40, 4 cc lidocaine 1% without epinephrine injected easily. Completed without difficulty  Pain immediately resolved suggesting accurate placement of the medication.  Advised to call if fevers/chills, erythema, induration, drainage, or persistent bleeding.  Images permanently stored and available for review in the ultrasound unit.  Impression: Technically successful ultrasound guided injection.  Impression and Recommendations:

## 2012-05-28 NOTE — Assessment & Plan Note (Signed)
This most likely represents a degenerative meniscal tear. We'll go ahead and x-ray her knee. Ultrasound guided injection as above. Mobic daily. Home rehabilitation exercises. I would like to see her back in 3 weeks, and if no better we can consider an MRI of her knee.

## 2012-05-29 ENCOUNTER — Encounter: Payer: Self-pay | Admitting: Sports Medicine

## 2012-05-29 ENCOUNTER — Ambulatory Visit (INDEPENDENT_AMBULATORY_CARE_PROVIDER_SITE_OTHER): Payer: BC Managed Care – PPO | Admitting: Sports Medicine

## 2012-05-29 VITALS — BP 151/71 | HR 72 | Temp 98.1°F | Resp 16

## 2012-05-29 DIAGNOSIS — M25562 Pain in left knee: Secondary | ICD-10-CM

## 2012-05-29 DIAGNOSIS — M25569 Pain in unspecified knee: Secondary | ICD-10-CM

## 2012-05-29 MED ORDER — TRAMADOL HCL 50 MG PO TABS: 50.0000 mg | ORAL_TABLET | Freq: Four times a day (QID) | ORAL | Status: AC | PRN

## 2012-05-29 NOTE — Assessment & Plan Note (Signed)
Unfortunately Chancey has suffered a reinjury of her arthritic knee. Based on her exam, I do not suspect any further intervention, other than analgesia as needed. The steroid placed in her knee yesterday, will be present for an entire week. I will call in some Ultram for her to use in the meantime. We've also placed the sleeve, and given her some crutches to use. She was to come back to see me in 3 weeks, and certainly can come to see me sooner should she have trouble.

## 2012-05-29 NOTE — Progress Notes (Signed)
Patient ID: Diana Parsons, female   DOB: March 29, 1934, 78 y.o.   MRN: [id number] Subjective:    CC: followup left knee pain.  Diana Parsons:XLKG is an extremely pleasant 78 year old female who I saw yesterday for her left knee pain. I suspected DJD, as well as a degenerative meniscal tear. She of course did have moderate DJD of the medial tibiofemoral compartment on x-rays. I injected her knee, and give her some Mobic. She was doing extremely well, and most of the day yesterday she had zero pain. Unfortunately she twisted, felt a lot of pain, and fell.  She is here for me to take a look at the knee.  Past medical history, Surgical history, Family history, Social history, Allergies, and medications have been entered into the medical record, reviewed, and no changes needed.   Review of Systems: No fevers, chills, night sweats, weight loss, chest pain, or shortness of breath.   Objective:    General: Well Developed, well nourished, and in no acute distress.  Neuro: Alert and oriented x3, extra-ocular muscles intact.  HEENT: Normocephalic, atraumatic, pupils equal round reactive to light, neck supple, no masses, no lymphadenopathy, thyroid nonpalpable.  Skin: Warm and dry, no rashes. Cardiac: Regular rate and rhythm, no murmurs rubs or gallops.  Respiratory: Clear to auscultation bilaterally. Not using accessory muscles, speaking in full sentences. Left Knee: Normal to inspection with no erythema or effusion or obvious bony abnormalities. She still has medial joint line pain. ROM full in flexion and extension and lower leg rotation. Ligaments with solid consistent endpoints including ACL, PCL, LCL, MCL. Negative Mcmurray's, Apley's, and Thessalonian tests. Non painful patellar compression. Patellar glide without crepitus. Patellar and quadriceps tendons unremarkable. Hamstring and quadriceps strength is normal.    Impression and Recommendations:

## 2012-05-29 NOTE — Addendum Note (Signed)
Addended by: Chalmers Cater on: 05/29/2012 05:01 PM   Modules accepted: Orders

## 2012-06-10 ENCOUNTER — Encounter: Payer: Self-pay | Admitting: Family Medicine

## 2012-06-12 ENCOUNTER — Encounter: Payer: Self-pay | Admitting: Family Medicine

## 2012-06-12 ENCOUNTER — Ambulatory Visit (INDEPENDENT_AMBULATORY_CARE_PROVIDER_SITE_OTHER): Payer: BC Managed Care – PPO | Admitting: Family Medicine

## 2012-06-12 VITALS — BP 122/62 | HR 62 | Wt 240.0 lb

## 2012-06-12 DIAGNOSIS — E785 Hyperlipidemia, unspecified: Secondary | ICD-10-CM

## 2012-06-12 DIAGNOSIS — Z1231 Encounter for screening mammogram for malignant neoplasm of breast: Secondary | ICD-10-CM

## 2012-06-12 DIAGNOSIS — N951 Menopausal and female climacteric states: Secondary | ICD-10-CM

## 2012-06-12 DIAGNOSIS — E119 Type 2 diabetes mellitus without complications: Secondary | ICD-10-CM

## 2012-06-12 LAB — POCT GLYCOSYLATED HEMOGLOBIN (HGB A1C): Hemoglobin A1C: 6.9

## 2012-06-12 MED ORDER — METFORMIN HCL ER (OSM) 1000 MG PO TB24: 1000.0000 mg | ORAL_TABLET | Freq: Every day | ORAL | Status: DC

## 2012-06-12 NOTE — Progress Notes (Signed)
  Subjective:    Patient ID: Diana Parsons, female    DOB: 1934-03-27, 78 y.o.   MRN: [id number]  HPI DM- 30 days average is 122. 14 day average 118.  She is doing great. No hypoglycemic events.  Not on ACE/ARB.  On statin.  Doing well.  No regular exercise right now.  Say the metformin was almost $400.   Hyperlipidemia - Tolerating Crestor well. No SE of med.  Due to repeat lipids Lab Results  Component Value Date   CHOL 211* 03/11/2012   HDL 39* 03/11/2012   LDLCALC 138* 03/11/2012   LDLDIRECT 104* 09/14/2011   TRIG 172* 03/11/2012   CHOLHDL 5.4 03/11/2012       Review of Systems     Objective:   Physical Exam  Constitutional: She is oriented to person, place, and time. She appears well-developed and well-nourished.  HENT:  Head: Normocephalic and atraumatic.  Cardiovascular: Normal rate, regular rhythm and normal heart sounds.   Pulmonary/Chest: Effort normal and breath sounds normal.  Musculoskeletal: She exhibits no edema.  Neurological: She is alert and oriented to person, place, and time.  Skin: Skin is warm and dry.  Psychiatric: She has a normal mood and affect. Her behavior is normal.          Assessment & Plan:  DM- A1C is 6.9.  Looks fantastic today. She's finally at goal. Continue current regimen. We'll call and make sure that they feel the generic version of metformin and not the brand. Followup in 3 months. Her eye exam is up-to-date. Monofilament exam performed today.  Hyperlipidemia-she's doing well on the Crestor. No side effects or myalgias. She's due to recheck her lipid levels. Given a lab slip for CMP and fasting lipid panel.  Schedule for mammo and DEXA.

## 2012-06-17 LAB — COMPLETE METABOLIC PANEL WITH GFR
AST: 16 U/L (ref 0–37)
Albumin: 4.2 g/dL (ref 3.5–5.2)
Alkaline Phosphatase: 75 U/L (ref 39–117)
BUN: 13 mg/dL (ref 6–23)
Creat: 0.59 mg/dL (ref 0.50–1.10)
GFR, Est Non African American: >89 mL/min
Glucose, Bld: 111 mg/dL — ABNORMAL HIGH (ref 70–99)
Potassium: 4.5 mEq/L (ref 3.5–5.3)
Total Bilirubin: 0.4 mg/dL (ref 0.3–1.2)

## 2012-06-17 LAB — LIPID PANEL
Cholesterol: 122 mg/dL (ref 0–200)
HDL: 41 mg/dL (ref >39)
Total CHOL/HDL Ratio: 3 Ratio
Triglycerides: 84 mg/dL (ref <150)
VLDL: 17 mg/dL (ref 0–40)

## 2012-06-18 ENCOUNTER — Encounter: Payer: Self-pay | Admitting: Sports Medicine

## 2012-06-18 ENCOUNTER — Ambulatory Visit: Payer: BC Managed Care – PPO | Admitting: Sports Medicine

## 2012-06-18 ENCOUNTER — Ambulatory Visit (INDEPENDENT_AMBULATORY_CARE_PROVIDER_SITE_OTHER): Payer: BC Managed Care – PPO | Admitting: Sports Medicine

## 2012-06-18 VITALS — BP 129/60 | HR 72 | Temp 98.0°F | Wt 240.0 lb

## 2012-06-18 DIAGNOSIS — M25562 Pain in left knee: Secondary | ICD-10-CM

## 2012-06-18 DIAGNOSIS — M25569 Pain in unspecified knee: Secondary | ICD-10-CM

## 2012-06-18 NOTE — Progress Notes (Signed)
Patient ID: Diana Parsons, female   DOB: Mar 11, 1934, 78 y.o.   MRN: [id number] Subjective:    CC: Followup knee pain  HPI:  Diana Parsons is an extremely pleasant 78 year old female who comes to see me for followup of her left knee pain. I suspect degenerative meniscal tear, and x-rays were suggestive of degenerative changes. I injected her knee approximately 2 weeks ago. She did have an unfortunate event where she twisted her knee, and came back to see me the next day with increasing pain. Her knee was stable, and so she was treated conservatively from that point on. Today she returns over 80% better, and extremely happy with the results. Most of the time, she notes zero pain at all. She does note that her knee feels somewhat unstable, and wobbly, and wonders if formal physical therapy would be reasonable. She has done formal therapy her left rotator cuff in the distant past and this is working well.  Past medical history, Surgical history, Family history, Social history, Allergies, and medications have been entered into the medical record, reviewed, and no changes needed.   Review of Systems: No fevers, chills, night sweats, weight loss, chest pain, or shortness of breath.   Objective:    General: Well Developed, well nourished, and in no acute distress.  Neuro: Alert and oriented x3, extra-ocular muscles intact.  HEENT: Normocephalic, atraumatic, pupils equal round reactive to light, neck supple, no masses, no lymphadenopathy, thyroid nonpalpable.  Skin: Warm and dry, no rashes. Left Knee: Normal to inspection with no erythema or effusion or obvious bony abnormalities. Palpation normal with no warmth, joint line tenderness, patellar tenderness, or condyle tenderness. ROM full in flexion and extension and lower leg rotation. Ligaments with solid consistent endpoints including ACL, PCL, LCL, MCL. Negative Mcmurray's, Apley's, and Thessalonian tests. Non painful patellar compression. Patellar glide  without crepitus. Patellar and quadriceps tendons unremarkable. Hamstring and quadriceps strength is normal.    Impression and Recommendations:

## 2012-06-18 NOTE — Assessment & Plan Note (Signed)
Degenerative meniscal tear. Injected approximately 2-1/2 weeks ago. Overall approximately 80+ percent better. Patient's desiring formal physical therapy, as her knee is somewhat unstable. I think this is appropriate, we'll place the orders. She will come back to see me in 4-6 weeks.

## 2012-06-24 ENCOUNTER — Ambulatory Visit: Payer: Medicare Other | Attending: Sports Medicine | Admitting: Physical Therapy

## 2012-06-24 DIAGNOSIS — M6281 Muscle weakness (generalized): Secondary | ICD-10-CM | POA: Insufficient documentation

## 2012-06-24 DIAGNOSIS — R262 Difficulty in walking, not elsewhere classified: Secondary | ICD-10-CM | POA: Insufficient documentation

## 2012-06-24 DIAGNOSIS — IMO0001 Reserved for inherently not codable concepts without codable children: Secondary | ICD-10-CM | POA: Insufficient documentation

## 2012-06-24 DIAGNOSIS — M25569 Pain in unspecified knee: Secondary | ICD-10-CM | POA: Insufficient documentation

## 2012-06-27 ENCOUNTER — Ambulatory Visit: Payer: Medicare Other | Admitting: Physical Therapy

## 2012-06-30 ENCOUNTER — Encounter: Payer: Self-pay | Admitting: Family Medicine

## 2012-07-01 ENCOUNTER — Ambulatory Visit: Payer: Medicare Other | Admitting: Physical Therapy

## 2012-07-01 ENCOUNTER — Ambulatory Visit: Payer: BC Managed Care – PPO

## 2012-07-01 ENCOUNTER — Ambulatory Visit (INDEPENDENT_AMBULATORY_CARE_PROVIDER_SITE_OTHER): Payer: BC Managed Care – PPO

## 2012-07-01 DIAGNOSIS — Z1231 Encounter for screening mammogram for malignant neoplasm of breast: Secondary | ICD-10-CM

## 2012-07-01 DIAGNOSIS — R928 Other abnormal and inconclusive findings on diagnostic imaging of breast: Secondary | ICD-10-CM

## 2012-07-04 ENCOUNTER — Other Ambulatory Visit: Payer: Self-pay | Admitting: Family Medicine

## 2012-07-04 ENCOUNTER — Ambulatory Visit: Payer: Medicare Other | Admitting: Physical Therapy

## 2012-07-04 DIAGNOSIS — R928 Other abnormal and inconclusive findings on diagnostic imaging of breast: Secondary | ICD-10-CM

## 2012-07-08 ENCOUNTER — Ambulatory Visit: Payer: Medicare Other | Admitting: Physical Therapy

## 2012-07-11 ENCOUNTER — Encounter: Payer: BC Managed Care – PPO | Admitting: Physical Therapy

## 2012-07-11 ENCOUNTER — Ambulatory Visit: Payer: Medicare Other | Admitting: Physical Therapy

## 2012-07-14 ENCOUNTER — Other Ambulatory Visit: Payer: Self-pay | Admitting: Family Medicine

## 2012-07-15 ENCOUNTER — Ambulatory Visit: Payer: Medicare Other | Attending: Sports Medicine | Admitting: Physical Therapy

## 2012-07-15 DIAGNOSIS — R262 Difficulty in walking, not elsewhere classified: Secondary | ICD-10-CM | POA: Insufficient documentation

## 2012-07-15 DIAGNOSIS — M25569 Pain in unspecified knee: Secondary | ICD-10-CM | POA: Insufficient documentation

## 2012-07-15 DIAGNOSIS — M6281 Muscle weakness (generalized): Secondary | ICD-10-CM | POA: Insufficient documentation

## 2012-07-15 DIAGNOSIS — IMO0001 Reserved for inherently not codable concepts without codable children: Secondary | ICD-10-CM | POA: Insufficient documentation

## 2012-07-15 IMAGING — CR DG KNEE 1-2V*R*
2 series · 2 of 2 positions shown · non-contrast
Comparison: None.

CLINICAL DATA: Right knee pain.  No history of trauma.  Swelling.

RIGHT KNEE - 1-2 VIEW

[view not recorded (1 of 2)]
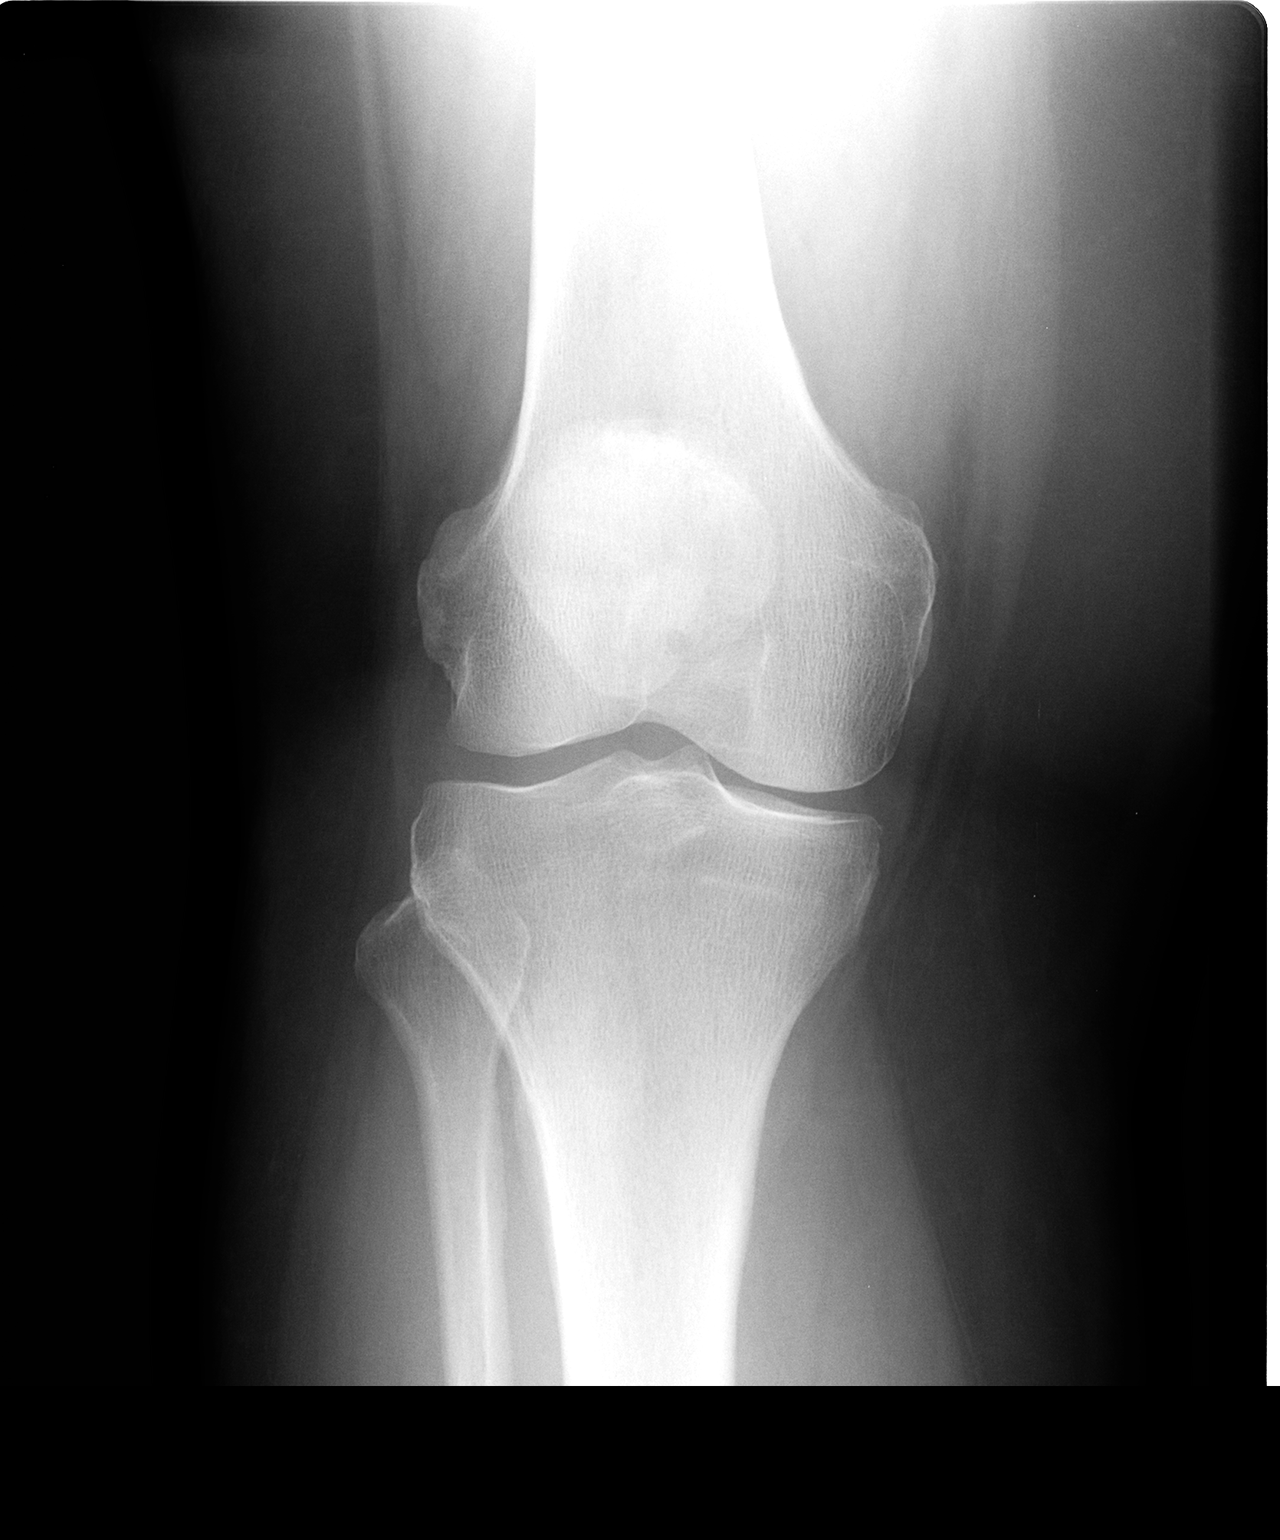

[view not recorded (2 of 2)]
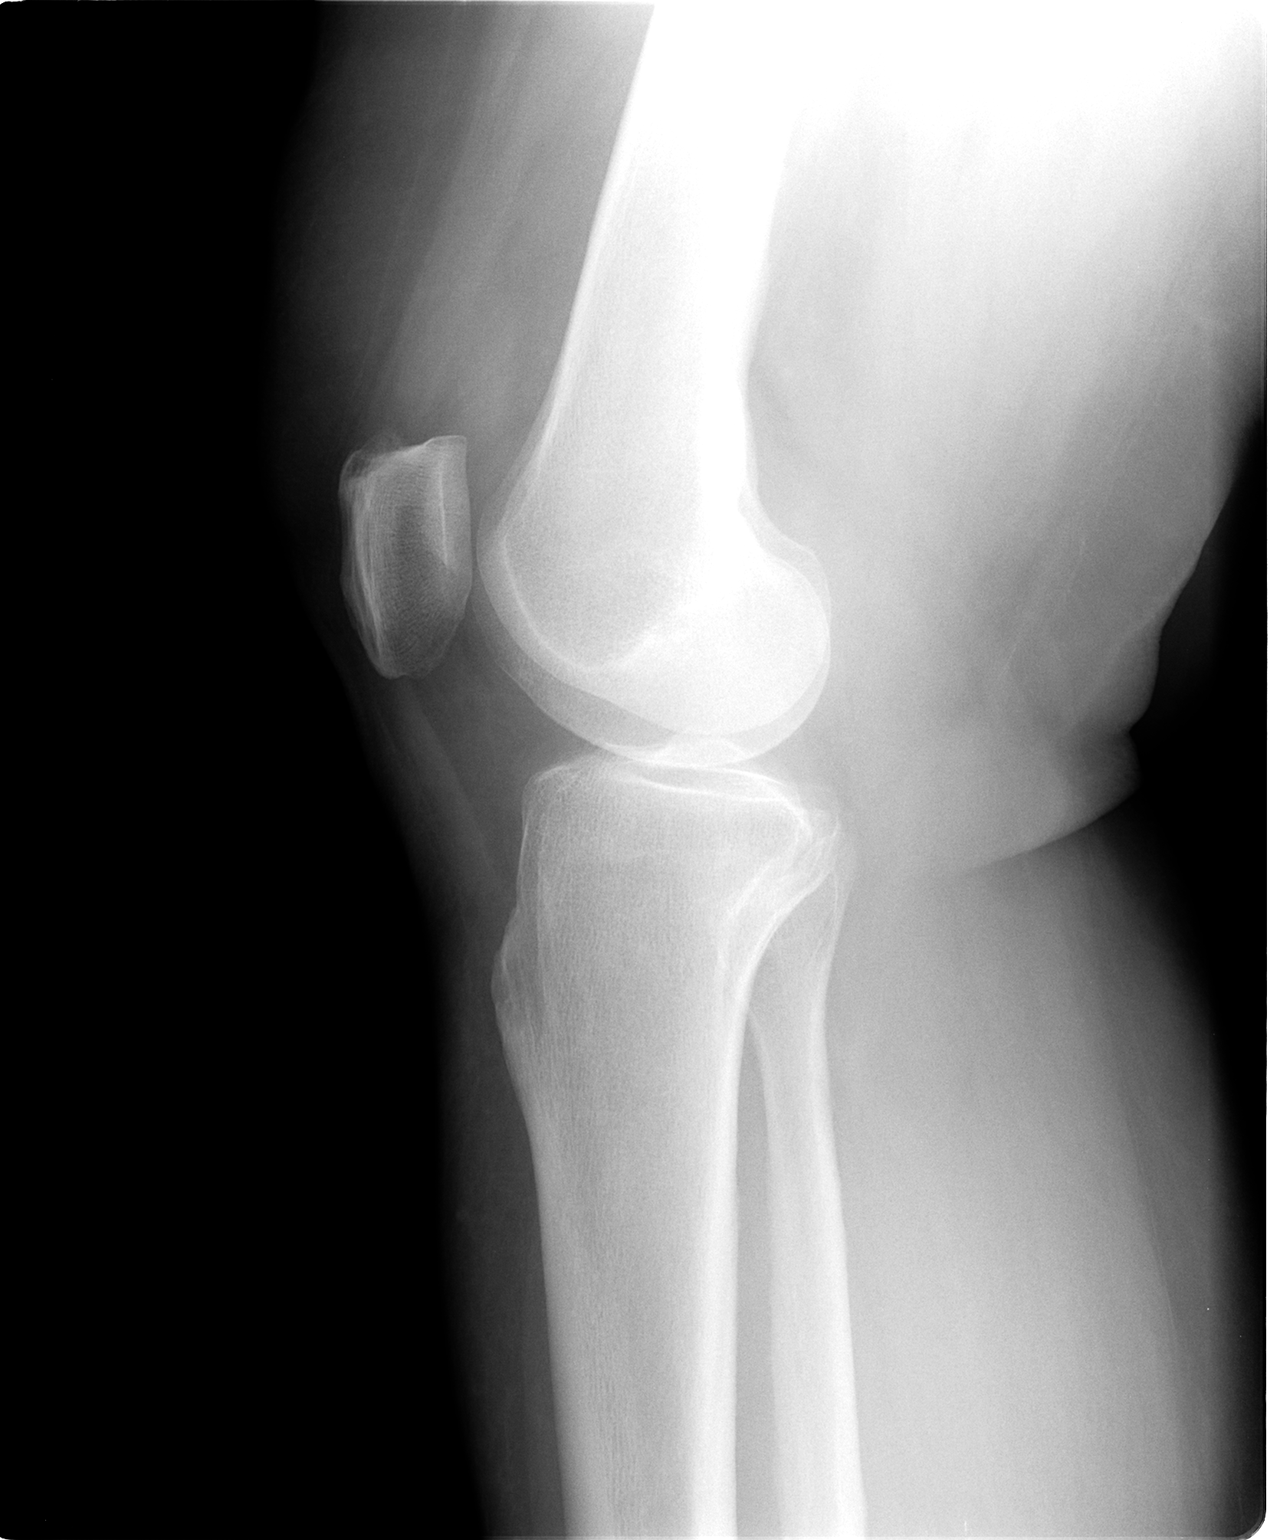

[2 of 2 positions shown; findings below may reference images not displayed]

FINDINGS: AP and lateral views.  Minimal medial and patellofemoral
compartment joint space narrowing and osteophyte formation.  Small
suprapatellar joint effusion.  Enthesopathic change at the
quadriceps insertion. No acute fracture or dislocation.
IMPRESSION: Mild two compartment osteoarthritis with small suprapatellar joint
effusion.

## 2012-07-17 ENCOUNTER — Encounter: Payer: Self-pay | Admitting: Sports Medicine

## 2012-07-17 ENCOUNTER — Ambulatory Visit (INDEPENDENT_AMBULATORY_CARE_PROVIDER_SITE_OTHER): Payer: BC Managed Care – PPO | Admitting: Sports Medicine

## 2012-07-17 VITALS — BP 143/72 | HR 97 | Wt 238.0 lb

## 2012-07-17 DIAGNOSIS — M25569 Pain in unspecified knee: Secondary | ICD-10-CM

## 2012-07-17 DIAGNOSIS — M25562 Pain in left knee: Secondary | ICD-10-CM

## 2012-07-17 DIAGNOSIS — E7800 Pure hypercholesterolemia, unspecified: Secondary | ICD-10-CM

## 2012-07-17 DIAGNOSIS — E119 Type 2 diabetes mellitus without complications: Secondary | ICD-10-CM

## 2012-07-17 MED ORDER — METFORMIN HCL 500 MG PO TABS: 500.0000 mg | ORAL_TABLET | Freq: Two times a day (BID) | ORAL | Status: DC

## 2012-07-17 NOTE — Progress Notes (Signed)
Subjective:    CC: Followup knee  HPI: Left knee pain: This was likely related to a degenerative meniscal tear. She underwent intra-articular injection, as well as a full course of formal physical therapy. Currently she is being discharged from therapy as she is completely pain-free, she no longer needs to walk with a cane, and has excellent stability. She is very happy with the results.  Diabetes: Last A1c was good, needs refill on metformin, desires immediate release, generic.  Hyperlipidemia: Last lipid panel was also well controlled, needs refill on Crestor.  No adverse effects noted from the above medicines.  Past medical history, Surgical history, Family history, Social history, Allergies, and medications have been entered into the medical record, reviewed, and no changes needed.   Review of Systems: No fevers, chills, night sweats, weight loss, chest pain, or shortness of breath.   Objective:    General: Well Developed, well nourished, and in no acute distress.  Neuro: Alert and oriented x3, extra-ocular muscles intact.  HEENT: Normocephalic, atraumatic, pupils equal round reactive to light. Skin: Warm and dry, no rashes.  Respiratory:  Not using accessory muscles, speaking in full sentences. Musculoskeletal: Patient displays full range of motion of her left knee, ambulates with a nonantalgic gait, displays excellent stability, and a completely unremarkable gait.  Impression and Recommendations:

## 2012-07-17 NOTE — Assessment & Plan Note (Signed)
Crestor refill. Based on last lipid panel, no changes made. She will followup with her PCP regarding this as well.

## 2012-07-17 NOTE — Assessment & Plan Note (Signed)
Pain is gone. No longer needs cane. She is released, and can come see me back on an as-needed basis.

## 2012-07-17 NOTE — Assessment & Plan Note (Signed)
Refilled with metformin 500 mg twice a day. She will followup with her primary care physician regarding this.

## 2012-07-18 ENCOUNTER — Encounter: Payer: Self-pay | Admitting: *Deleted

## 2012-07-18 ENCOUNTER — Ambulatory Visit
Admission: RE | Admit: 2012-07-18 | Discharge: 2012-07-18 | Disposition: A | Discharge status: Home / Self Care | Payer: Medicare Other | Source: Ambulatory Visit | Attending: Family Medicine | Admitting: Family Medicine

## 2012-07-18 DIAGNOSIS — R928 Other abnormal and inconclusive findings on diagnostic imaging of breast: Secondary | ICD-10-CM

## 2012-08-04 ENCOUNTER — Encounter: Payer: Self-pay | Admitting: Family Medicine

## 2012-09-19 ENCOUNTER — Ambulatory Visit (INDEPENDENT_AMBULATORY_CARE_PROVIDER_SITE_OTHER): Payer: Medicare Other | Admitting: Sports Medicine

## 2012-09-19 ENCOUNTER — Ambulatory Visit (INDEPENDENT_AMBULATORY_CARE_PROVIDER_SITE_OTHER): Payer: Medicare Other | Admitting: Family Medicine

## 2012-09-19 ENCOUNTER — Encounter: Payer: Self-pay | Admitting: Family Medicine

## 2012-09-19 ENCOUNTER — Ambulatory Visit: Payer: Medicare Other | Admitting: Family Medicine

## 2012-09-19 ENCOUNTER — Ambulatory Visit: Payer: BC Managed Care – PPO | Admitting: Family Medicine

## 2012-09-19 VITALS — BP 132/64 | HR 92 | Wt 246.0 lb

## 2012-09-19 DIAGNOSIS — M25569 Pain in unspecified knee: Secondary | ICD-10-CM

## 2012-09-19 DIAGNOSIS — M25469 Effusion, unspecified knee: Secondary | ICD-10-CM

## 2012-09-19 DIAGNOSIS — Z23 Encounter for immunization: Secondary | ICD-10-CM

## 2012-09-19 DIAGNOSIS — E785 Hyperlipidemia, unspecified: Secondary | ICD-10-CM

## 2012-09-19 DIAGNOSIS — E119 Type 2 diabetes mellitus without complications: Secondary | ICD-10-CM

## 2012-09-19 DIAGNOSIS — M25562 Pain in left knee: Secondary | ICD-10-CM

## 2012-09-19 LAB — POCT GLYCOSYLATED HEMOGLOBIN (HGB A1C): Hemoglobin A1C: 6.7

## 2012-09-19 MED ORDER — METFORMIN HCL 500 MG PO TABS: 500.0000 mg | ORAL_TABLET | Freq: Two times a day (BID) | ORAL | Status: DC

## 2012-09-19 MED ORDER — ROSUVASTATIN CALCIUM 20 MG PO TABS: 20.0000 mg | ORAL_TABLET | Freq: Every day | ORAL | Status: DC

## 2012-09-19 NOTE — Progress Notes (Signed)
SPORTS MEDICINE CONSULTATION REPORT  Subjective:    CC: Left knee pain  HPI: Elanah comes back to see me, I injected her left knee which has known, x-ray confirmed DJD. This did very well for approximately 3-1/2 months. Unfortunately she developed an additional effusion. The knee feels tight, heavy, and she is unable to bend it as well. She denies any mechanical symptoms, but does have gelling, and pain. The pain is localized, doesn't radiate, and is moderate.  Past medical history, Surgical history, Family history, Social history, Allergies, and medications have been entered into the medical record, reviewed, and no changes needed.   Review of Systems: No headache, visual changes, nausea, vomiting, diarrhea, constipation, dizziness, abdominal pain, skin rash, fevers, chills, night sweats, weight loss, swollen lymph nodes, body aches, joint swelling, muscle aches, chest pain, shortness of breath, mood changes, visual or auditory hallucinations.   Objective:   Vitals:  Afebrile, vital signs stable. General: Well Developed, well nourished, and in no acute distress.  Neuro/Psych: Alert and oriented x3, extra-ocular muscles intact, able to move all 4 extremities.  Skin: Warm and dry, no rashes noted.  Respiratory: Not using accessory muscles, speaking in full sentences, trachea midline.  Cardiovascular: Pulses palpable, no extremity edema. Abdomen: Does not appear distended. Left Knee: Normal to inspection with no erythema or effusion or obvious bony abnormalities. Moderate effusion with palpable fluid wave. ROM full in flexion and extension and lower leg rotation. Ligaments with solid consistent endpoints including ACL, PCL, LCL, MCL. Negative Mcmurray's, Apley's, and Thessalonian tests. Non painful patellar compression. Patellar glide without crepitus. Patellar and quadriceps tendons unremarkable. Hamstring and quadriceps strength is normal.   Procedure: Real-time Ultrasound Guided  aspiration/injection of left knee Device: GE Logiq E  Ultrasound guided injection is preferred based studies that show increased duration, increased effect, greater accuracy, decreased procedural pain, increased response rate, and decreased cost with ultrasound guided versus blind injection.  Verbal informed consent obtained.  Time-out conducted.  Noted no overlying erythema, induration, or other signs of local infection.  Skin prepped in a sterile fashion.  Local anesthesia: Topical Ethyl chloride.  With sterile technique and under real time ultrasound guidance:  5 cc lidocaine injected in a fanlike pattern in the needle path. An 18-gauge needle with a 60 cc syringe guide in her real-time ultrasound guidance into the suprapatellar recess, 10 cc of serosanguineous fluid aspirated. This was clear. The syringe was switched, and 2 cc Kenalog 40, 4 cc lidocaine injected into the suprapatellar recess. Completed without difficulty  Pain immediately resolved suggesting accurate placement of the medication.  Advised to call if fevers/chills, erythema, induration, drainage, or persistent bleeding.  Images permanently stored and available for review in the ultrasound unit.  Impression: Technically successful ultrasound guided injection.  Impression and Recommendations:   This case required medical decision making of moderate complexity.

## 2012-09-19 NOTE — Progress Notes (Signed)
  Subjective:    Patient ID: Diana Parsons, female    DOB: Nov 21, 1933, 78 y.o.   MRN: [id number]  HPI DM- Home sugars are running 120s.  No hypoglycemic events.  Feeling well. Toleratoing metformin well.   Hyperlipidemia-    Review of Systems     Objective:   Physical Exam  Constitutional: She is oriented to person, place, and time. She appears well-developed and well-nourished.  HENT:  Head: Normocephalic and atraumatic.  Cardiovascular: Normal rate, regular rhythm and normal heart sounds.   Pulmonary/Chest: Effort normal and breath sounds normal.  Neurological: She is alert and oriented to person, place, and time.  Skin: Skin is warm and dry.  Psychiatric: She has a normal mood and affect. Her behavior is normal.          Assessment & Plan:  DM - well controlled. As her fasting lipid A1c yet. Very proud of her she's doing fantastic. Continue with healthy diet and good food choices and try to get as much exercise as possible. Followup in 3 months. Continue current regimen. Her urine microalbumin, foot exam and eye exam are all up to date. Lab Results  Component Value Date   HGBA1C 6.7 09/19/2012     Hyperlipidemia - I. refilled her Crestor. She's doing well on her current regimen. Not due for blood work at this time. Followup in 3 months.  Flu vaccine given today.

## 2012-09-19 NOTE — Assessment & Plan Note (Signed)
Last injection lasted approximately 3-1/2 months. Aspiration and injection today. Continue rehabilitation exercises, I strapped the knee with an elastic bandage today. She'll see me back in 4 weeks. If does well, no further intervention, but if pain returns then we can consider Visco supplementation.

## 2012-09-25 ENCOUNTER — Other Ambulatory Visit: Payer: Self-pay | Admitting: Family Medicine

## 2012-10-21 ENCOUNTER — Encounter: Payer: Self-pay | Admitting: Sports Medicine

## 2012-10-21 ENCOUNTER — Ambulatory Visit (INDEPENDENT_AMBULATORY_CARE_PROVIDER_SITE_OTHER): Payer: Medicare Other | Admitting: Sports Medicine

## 2012-10-21 VITALS — BP 138/72 | HR 79 | Wt 233.0 lb

## 2012-10-21 DIAGNOSIS — M25562 Pain in left knee: Secondary | ICD-10-CM

## 2012-10-21 DIAGNOSIS — E119 Type 2 diabetes mellitus without complications: Secondary | ICD-10-CM

## 2012-10-21 DIAGNOSIS — E7800 Pure hypercholesterolemia, unspecified: Secondary | ICD-10-CM

## 2012-10-21 MED ORDER — GLUCOSE BLOOD VI STRP: ORAL_STRIP | Status: DC

## 2012-10-21 MED ORDER — MELOXICAM 15 MG PO TABS: 15.0000 mg | ORAL_TABLET | Freq: Every day | ORAL | Status: DC

## 2012-10-21 NOTE — Assessment & Plan Note (Signed)
Controlled, needs refill on test strips.

## 2012-10-21 NOTE — Assessment & Plan Note (Signed)
Controlled, no changes needed 

## 2012-10-21 NOTE — Progress Notes (Signed)
SPORTS MEDICINE CONSULTATION REPORT  Subjective:    CC: Followup  HPI: Knee osteoarthritis:  I injected Cianna's knee approximately 4 weeks ago, she is currently pain-free. Mobic continues to help her pain greatly.  Diabetes mellitus type 2: Controlled, desires refill on test strips.  Hyperlipidemia: Controlled, no changes needed.  Past medical history, Surgical history, Family history, Social history, Allergies, and medications have been entered into the medical record, reviewed, and no changes needed.   Review of Systems: No headache, visual changes, nausea, vomiting, diarrhea, constipation, dizziness, abdominal pain, skin rash, fevers, chills, night sweats, weight loss, swollen lymph nodes, body aches, joint swelling, muscle aches, chest pain, shortness of breath, mood changes, visual or auditory hallucinations.   Objective:   Vitals:  Afebrile, vital signs stable. General: Well Developed, well nourished, and in no acute distress.  Neuro/Psych: Alert and oriented x3, extra-ocular muscles intact, able to move all 4 extremities.  Skin: Warm and dry, no rashes noted.  Respiratory: Not using accessory muscles, speaking in full sentences, trachea midline.  Cardiovascular: Pulses palpable, no extremity edema. Abdomen: Does not appear distended. Left Knee: Normal to inspection with no erythema or effusion or obvious bony abnormalities. Palpation normal with no warmth, joint line tenderness, patellar tenderness, or condyle tenderness. ROM full in flexion and extension and lower leg rotation. Ligaments with solid consistent endpoints including ACL, PCL, LCL, MCL. Negative Mcmurray's, Apley's, and Thessalonian tests. Non painful patellar compression. Patellar glide without crepitus. Patellar and quadriceps tendons unremarkable. Hamstring and quadriceps strength is normal.    Impression and Recommendations:   This case required medical decision making of moderate complexity.

## 2012-10-21 NOTE — Assessment & Plan Note (Signed)
Overall symptoms are resolved. She did have an intra-articular steroid injection approximately one month ago. Mobic also continues to do very well. I will refill this. She can come back to see me if the pain does return, at which point we can start Visco supplementation.

## 2012-10-30 ENCOUNTER — Other Ambulatory Visit: Payer: Self-pay | Admitting: *Deleted

## 2012-10-30 MED ORDER — GLUCOSE BLOOD VI STRP: ORAL_STRIP | Status: DC

## 2012-11-05 ENCOUNTER — Ambulatory Visit (INDEPENDENT_AMBULATORY_CARE_PROVIDER_SITE_OTHER): Payer: Medicare Other | Admitting: Sports Medicine

## 2012-11-05 DIAGNOSIS — IMO0002 Reserved for concepts with insufficient information to code with codable children: Secondary | ICD-10-CM

## 2012-11-05 DIAGNOSIS — M1710 Unilateral primary osteoarthritis, unspecified knee: Secondary | ICD-10-CM

## 2012-11-05 DIAGNOSIS — M25562 Pain in left knee: Secondary | ICD-10-CM

## 2012-11-05 NOTE — Assessment & Plan Note (Signed)
Ultrasound-guided Supartz injection as above. She will return in one week for injection of 2 of 5.

## 2012-11-05 NOTE — Progress Notes (Signed)
SPORTS MEDICINE CONSULTATION REPORT  Subjective:    CC: Followup  HPI: Marian comes back to discuss her left knee pain. She has left knee degenerative joint disease with possible degenerative meniscal tear. She's had 2 intra-articular steroid injections, the most recent of which didn't last very long. Her pain is localized over the medial joint line with occasional mechanical symptoms and swelling. Pain does not radiate. It is moderate.  Past medical history, Surgical history, Family history not pertinant except as noted below, Social history, Allergies, and medications have been entered into the medical record, reviewed, and no changes needed.   Review of Systems: No headache, visual changes, nausea, vomiting, diarrhea, constipation, dizziness, abdominal pain, skin rash, fevers, chills, night sweats, weight loss, swollen lymph nodes, body aches, joint swelling, muscle aches, chest pain, shortness of breath, mood changes, visual or auditory hallucinations.   Objective:   General: Well Developed, well nourished, and in no acute distress.  Neuro/Psych: Alert and oriented x3, extra-ocular muscles intact, able to move all 4 extremities, sensation grossly intact. Skin: Warm and dry, no rashes noted.  Respiratory: Not using accessory muscles, speaking in full sentences, trachea midline.  Cardiovascular: Pulses palpable, no extremity edema. Abdomen: Does not appear distended. Left Knee: Moderate effusion with fluid wave. Tender to palpation on the medial joint line. ROM full in flexion and extension and lower leg rotation. Ligaments with solid consistent endpoints including ACL, PCL, LCL, MCL. Negative Mcmurray's, Apley's, and Thessalonian tests. Non painful patellar compression. Patellar glide without crepitus. Patellar and quadriceps tendons unremarkable. Hamstring and quadriceps strength is normal  Procedure: Real-time Ultrasound Guided Injection of left suprapatellar recess Device: GE Logiq  E  Ultrasound guided injection is preferred based studies that show increased duration, increased effect, greater accuracy, decreased procedural pain, increased response rate, and decreased cost with ultrasound guided versus blind injection.  Verbal informed consent obtained.  Time-out conducted.  Noted no overlying erythema, induration, or other signs of local infection.  Skin prepped in a sterile fashion.  Local anesthesia: Topical Ethyl chloride.  With sterile technique and under real time ultrasound guidance:  23-gauge needle advanced into suprapatellar recess, 25 mg/2.5 mL of Supartz (sodium hyaluronate) in a prefilled syringe was injected easily into the knee through a 22-gauge needle. Completed without difficulty  Advised to call if fevers/chills, erythema, induration, drainage, or persistent bleeding.  Images permanently stored and available for review in the ultrasound unit.  Impression: Technically successful ultrasound guided injection.   Impression and Recommendations:   This case required medical decision making of moderate complexity.

## 2012-11-12 ENCOUNTER — Ambulatory Visit: Payer: Medicare Other | Admitting: Sports Medicine

## 2012-11-19 ENCOUNTER — Ambulatory Visit (INDEPENDENT_AMBULATORY_CARE_PROVIDER_SITE_OTHER): Payer: Medicare Other | Admitting: Sports Medicine

## 2012-11-19 DIAGNOSIS — M1710 Unilateral primary osteoarthritis, unspecified knee: Secondary | ICD-10-CM

## 2012-11-19 DIAGNOSIS — IMO0002 Reserved for concepts with insufficient information to code with codable children: Secondary | ICD-10-CM

## 2012-11-19 DIAGNOSIS — M25562 Pain in left knee: Secondary | ICD-10-CM

## 2012-11-19 NOTE — Assessment & Plan Note (Signed)
Ultrasound-guided Supartz injection as above. She will return in one week for injection number 3 of 5.

## 2012-11-19 NOTE — Progress Notes (Signed)
She has already noted a benefit after injection number 1.  Procedure: Real-time Ultrasound Guided Injection of left suprapatellar recess  Device: GE Logiq E  Ultrasound guided injection is preferred based studies that show increased duration, increased effect, greater accuracy, decreased procedural pain, increased response rate, and decreased cost with ultrasound guided versus blind injection.  Verbal informed consent obtained.  Time-out conducted.  Noted no overlying erythema, induration, or other signs of local infection.  Skin prepped in a sterile fashion.  Local anesthesia: Topical Ethyl chloride.  With sterile technique and under real time ultrasound guidance: 23-gauge needle advanced into suprapatellar recess, 25 mg/2.5 mL of Supartz (sodium hyaluronate) in a prefilled syringe was injected easily into the knee through a 22-gauge needle.  Completed without difficulty  Advised to call if fevers/chills, erythema, induration, drainage, or persistent bleeding.  Images permanently stored and available for review in the ultrasound unit.  Impression: Technically successful ultrasound guided injection.

## 2012-11-26 ENCOUNTER — Ambulatory Visit (INDEPENDENT_AMBULATORY_CARE_PROVIDER_SITE_OTHER): Payer: Medicare Other | Admitting: Sports Medicine

## 2012-11-26 DIAGNOSIS — M1710 Unilateral primary osteoarthritis, unspecified knee: Secondary | ICD-10-CM

## 2012-11-26 DIAGNOSIS — IMO0002 Reserved for concepts with insufficient information to code with codable children: Secondary | ICD-10-CM

## 2012-11-26 DIAGNOSIS — M25562 Pain in left knee: Secondary | ICD-10-CM

## 2012-11-26 NOTE — Progress Notes (Addendum)
Left knee osteoarthritis undergoing viscosupplementation.  Procedure: Real-time Ultrasound Guided Injection of left suprapatellar recess  Device: GE Logiq E  Ultrasound guided injection is preferred based studies that show increased duration, increased effect, greater accuracy, decreased procedural pain, increased response rate, and decreased cost with ultrasound guided versus blind injection.  Verbal informed consent obtained.  Time-out conducted.  Noted no overlying erythema, induration, or other signs of local infection.  Skin prepped in a sterile fashion.  Local anesthesia: Topical Ethyl chloride.  With sterile technique and under real time ultrasound guidance: 22-gauge needle advanced into suprapatellar recess, 14 cc of straw-colored fluid aspirated, syringe switched and 25 mg/2.5 mL of Supartz (sodium hyaluronate) in a prefilled syringe was injected easily into the knee through a 22-gauge needle.  Completed without difficulty  Advised to call if fevers/chills, erythema, induration, drainage, or persistent bleeding.  Images permanently stored and available for review in the ultrasound unit.  Impression: Technically successful ultrasound guided injection.  

## 2012-11-26 NOTE — Assessment & Plan Note (Signed)
Ultrasound-guided Supartz injection as above. She will return in one week for injection number 4 of 5.

## 2012-12-18 ENCOUNTER — Ambulatory Visit (INDEPENDENT_AMBULATORY_CARE_PROVIDER_SITE_OTHER): Payer: Medicare Other | Admitting: Sports Medicine

## 2012-12-18 DIAGNOSIS — M1712 Unilateral primary osteoarthritis, left knee: Secondary | ICD-10-CM

## 2012-12-18 DIAGNOSIS — IMO0002 Reserved for concepts with insufficient information to code with codable children: Secondary | ICD-10-CM

## 2012-12-18 DIAGNOSIS — M1710 Unilateral primary osteoarthritis, unspecified knee: Secondary | ICD-10-CM

## 2012-12-18 NOTE — Progress Notes (Signed)
Left knee osteoarthritis undergoing viscosupplementation.  Procedure: Real-time Ultrasound Guided Injection of left suprapatellar recess  Device: GE Logiq E  Ultrasound guided injection is preferred based studies that show increased duration, increased effect, greater accuracy, decreased procedural pain, increased response rate, and decreased cost with ultrasound guided versus blind injection.  Verbal informed consent obtained.  Time-out conducted.  Noted no overlying erythema, induration, or other signs of local infection.  Skin prepped in a sterile fashion.  Local anesthesia: Topical Ethyl chloride.  With sterile technique and under real time ultrasound guidance: 22-gauge needle advanced into suprapatellar recess, 14 cc of straw-colored fluid aspirated, syringe switched and 25 mg/2.5 mL of Supartz (sodium hyaluronate) in a prefilled syringe was injected easily into the knee through a 22-gauge needle.  Completed without difficulty  Advised to call if fevers/chills, erythema, induration, drainage, or persistent bleeding.  Images permanently stored and available for review in the ultrasound unit.  Impression: Technically successful ultrasound guided injection.

## 2012-12-18 NOTE — Assessment & Plan Note (Signed)
Ultrasound-guided Supartz injection #4 of 5. She will return in one week for #5.

## 2012-12-23 ENCOUNTER — Ambulatory Visit: Payer: Medicare Other | Admitting: Family Medicine

## 2013-01-05 ENCOUNTER — Ambulatory Visit: Payer: Medicare Other | Admitting: Sports Medicine

## 2013-01-12 ENCOUNTER — Ambulatory Visit (INDEPENDENT_AMBULATORY_CARE_PROVIDER_SITE_OTHER): Payer: Medicare Other | Admitting: Sports Medicine

## 2013-01-12 DIAGNOSIS — IMO0002 Reserved for concepts with insufficient information to code with codable children: Secondary | ICD-10-CM

## 2013-01-12 DIAGNOSIS — M1712 Unilateral primary osteoarthritis, left knee: Secondary | ICD-10-CM

## 2013-01-12 DIAGNOSIS — M1710 Unilateral primary osteoarthritis, unspecified knee: Secondary | ICD-10-CM

## 2013-01-12 NOTE — Assessment & Plan Note (Signed)
Supartz injection #5 of 5 given today. Return in one month just to touch base, and she is released.

## 2013-01-12 NOTE — Progress Notes (Signed)
Left knee osteoarthritis undergoing viscosupplementation.  Procedure: Real-time Ultrasound Guided Injection of left suprapatellar recess  Device: GE Logiq E  Ultrasound guided injection is preferred based studies that show increased duration, increased effect, greater accuracy, decreased procedural pain, increased response rate, and decreased cost with ultrasound guided versus blind injection.  Verbal informed consent obtained.  Time-out conducted.  Noted no overlying erythema, induration, or other signs of local infection.  Skin prepped in a sterile fashion.  Local anesthesia: Topical Ethyl chloride.  With sterile technique and under real time ultrasound guidance: 22-gauge needle advanced into suprapatellar recess, 14 cc of straw-colored fluid aspirated, syringe switched and 25 mg/2.5 mL of Supartz (sodium hyaluronate) in a prefilled syringe was injected easily into the knee through a 22-gauge needle.  Completed without difficulty  Advised to call if fevers/chills, erythema, induration, drainage, or persistent bleeding.  Images permanently stored and available for review in the ultrasound unit.  Impression: Technically successful ultrasound guided injection.

## 2013-02-09 ENCOUNTER — Ambulatory Visit (INDEPENDENT_AMBULATORY_CARE_PROVIDER_SITE_OTHER): Payer: Medicare Other | Admitting: Sports Medicine

## 2013-02-09 ENCOUNTER — Encounter: Payer: Self-pay | Admitting: Sports Medicine

## 2013-02-09 VITALS — BP 126/53 | HR 94

## 2013-02-09 DIAGNOSIS — IMO0002 Reserved for concepts with insufficient information to code with codable children: Secondary | ICD-10-CM

## 2013-02-09 DIAGNOSIS — M1712 Unilateral primary osteoarthritis, left knee: Secondary | ICD-10-CM

## 2013-02-09 DIAGNOSIS — M1710 Unilateral primary osteoarthritis, unspecified knee: Secondary | ICD-10-CM

## 2013-02-09 NOTE — Assessment & Plan Note (Signed)
Pain has resolved, she still gets a little gelling which is not unexpected. She can come back to see me in 6 months. Supartz series can be repeated up to every 6 months.

## 2013-02-09 NOTE — Progress Notes (Signed)
  Subjective:    CC: Followup  HPI: Left knee osteoarthritis: Finished Supartz series. Pain-free, just a little gelling when getting up from bed, and when sitting for long periods of time. Overall very happy with results.  Past medical history, Surgical history, Family history not pertinant except as noted below, Social history, Allergies, and medications have been entered into the medical record, reviewed, and no changes needed.   Review of Systems: No fevers, chills, night sweats, weight loss, chest pain, or shortness of breath.   Objective:    General: Well Developed, well nourished, and in no acute distress.  Neuro: Alert and oriented x3, extra-ocular muscles intact, sensation grossly intact.  HEENT: Normocephalic, atraumatic, pupils equal round reactive to light, neck supple, no masses, no lymphadenopathy, thyroid nonpalpable.  Skin: Warm and dry, no rashes. Cardiac: Regular rate and rhythm, no murmurs rubs or gallops, no lower extremity edema.  Respiratory: Clear to auscultation bilaterally. Not using accessory muscles, speaking in full sentences. Left Knee: Normal to inspection with no erythema or effusion or obvious bony abnormalities. Palpation normal with no warmth, joint line tenderness, patellar tenderness, or condyle tenderness. ROM full in flexion and extension and lower leg rotation. Ligaments with solid consistent endpoints including ACL, PCL, LCL, MCL. Negative Mcmurray's, Apley's, and Thessalonian tests. Non painful patellar compression. Patellar glide without crepitus. Patellar and quadriceps tendons unremarkable. Hamstring and quadriceps strength is normal.  Impression and Recommendations:

## 2013-02-20 ENCOUNTER — Ambulatory Visit: Payer: Medicare Other | Admitting: Family Medicine

## 2013-02-20 DIAGNOSIS — Z0289 Encounter for other administrative examinations: Secondary | ICD-10-CM

## 2013-04-23 ENCOUNTER — Other Ambulatory Visit: Payer: Self-pay

## 2013-05-11 ENCOUNTER — Encounter: Payer: Self-pay | Admitting: Family Medicine

## 2013-05-11 ENCOUNTER — Ambulatory Visit (INDEPENDENT_AMBULATORY_CARE_PROVIDER_SITE_OTHER): Payer: Medicare Other | Admitting: Family Medicine

## 2013-05-11 VITALS — BP 124/63 | HR 102 | Wt 233.0 lb

## 2013-05-11 DIAGNOSIS — M25569 Pain in unspecified knee: Secondary | ICD-10-CM

## 2013-05-11 DIAGNOSIS — M545 Low back pain, unspecified: Secondary | ICD-10-CM

## 2013-05-11 DIAGNOSIS — M25561 Pain in right knee: Secondary | ICD-10-CM

## 2013-05-11 DIAGNOSIS — E119 Type 2 diabetes mellitus without complications: Secondary | ICD-10-CM

## 2013-05-11 DIAGNOSIS — K5900 Constipation, unspecified: Secondary | ICD-10-CM

## 2013-05-11 LAB — POCT GLYCOSYLATED HEMOGLOBIN (HGB A1C): Hemoglobin A1C: 6.8

## 2013-05-11 MED ORDER — ROSUVASTATIN CALCIUM 20 MG PO TABS: 20.0000 mg | ORAL_TABLET | Freq: Every day | ORAL | Status: DC

## 2013-05-11 MED ORDER — ACCU-CHEK MULTICLIX LANCETS MISC: Status: DC

## 2013-05-11 MED ORDER — METFORMIN HCL 500 MG PO TABS: 500.0000 mg | ORAL_TABLET | Freq: Two times a day (BID) | ORAL | Status: DC

## 2013-05-11 MED ORDER — GLUCOSE BLOOD VI STRP: ORAL_STRIP | Status: DC

## 2013-05-11 NOTE — Progress Notes (Signed)
  Subjective:    Patient ID: Diana Parsons, female    DOB: 08/21/1934, 79 y.o.   MRN: [id number]  HPI Saw Dr. Jeannett Senior pill. They are going to do arthroscopy on the left knee. Had MRI of the spine as well.  Was told had lots of arthritis in her spine.  Says her back has ben worse latelly and pain is radiating into her hips and thighs.  Ice helped.  Has had some problems with constipation as well and says after had BM this weekend she felt some better.   DM- no hypoglycemic events. No wounds that aren't healing well.  Taking her medication regularly. No side effects. Review of Systems     Objective:   Physical Exam  Constitutional: She is oriented to person, place, and time. She appears well-developed and well-nourished.  HENT:  Head: Normocephalic and atraumatic.  Right Ear: External ear normal.  Left Ear: External ear normal.  Nose: Nose normal.  Eyes: Conjunctivae and EOM are normal. Pupils are equal, round, and reactive to light.  Neck: Neck supple. No thyromegaly present.  Cardiovascular: Normal rate, regular rhythm and normal heart sounds.   Pulmonary/Chest: Effort normal and breath sounds normal. She has no wheezes.  Lymphadenopathy:    She has no cervical adenopathy.  Neurological: She is alert and oriented to person, place, and time.  Skin: Skin is warm and dry.  Psychiatric: She has a normal mood and affect.          Assessment & Plan:  DM- well controlled. On metoformin and statin and ASA. Foot exam today.  Due for eye exam.   Lab Results  Component Value Date   HGBA1C 6.8 05/11/2013   Constipation-discussed that she may be using MiraLax daily. She says typically she will use it if she does have a bowel movement after to 3 days. Says did provide some relief with her back pain I think she should start using it daily keep her bowels moving and see how this goes over the next 3-4 weeks.  Knee pain - seeing dr. Corinna Capra.   Back pain-seeing Dr. Jeannett Senior pill.

## 2013-05-11 NOTE — Addendum Note (Signed)
Addended by: Wyline Beady on: 05/11/2013 04:22 PM   Modules accepted: Orders

## 2013-05-13 LAB — LIPID PANEL
Cholesterol: 216 mg/dL — ABNORMAL HIGH (ref 0–200)
HDL: 40 mg/dL (ref >39)
LDL Cholesterol: 145 mg/dL — ABNORMAL HIGH (ref 0–99)
Total CHOL/HDL Ratio: 5.4 Ratio
Triglycerides: 157 mg/dL — ABNORMAL HIGH (ref <150)
VLDL: 31 mg/dL (ref 0–40)

## 2013-05-13 LAB — COMPLETE METABOLIC PANEL WITH GFR
ALT: 11 U/L (ref 0–35)
AST: 13 U/L (ref 0–37)
Alkaline Phosphatase: 99 U/L (ref 39–117)
BUN: 11 mg/dL (ref 6–23)
Chloride: 101 mEq/L (ref 96–112)
Creat: 0.71 mg/dL (ref 0.50–1.10)

## 2013-05-29 DIAGNOSIS — S83209A Unspecified tear of unspecified meniscus, current injury, unspecified knee, initial encounter: Secondary | ICD-10-CM | POA: Insufficient documentation

## 2013-08-11 ENCOUNTER — Encounter: Payer: Self-pay | Admitting: Family Medicine

## 2013-08-11 ENCOUNTER — Ambulatory Visit (INDEPENDENT_AMBULATORY_CARE_PROVIDER_SITE_OTHER): Payer: Medicare Other | Admitting: Family Medicine

## 2013-08-11 VITALS — BP 130/59 | HR 105 | Wt 236.0 lb

## 2013-08-11 DIAGNOSIS — Z23 Encounter for immunization: Secondary | ICD-10-CM

## 2013-08-11 DIAGNOSIS — E119 Type 2 diabetes mellitus without complications: Secondary | ICD-10-CM

## 2013-08-11 DIAGNOSIS — M48061 Spinal stenosis, lumbar region without neurogenic claudication: Secondary | ICD-10-CM

## 2013-08-11 MED ORDER — GLUCOSE BLOOD VI STRP: ORAL_STRIP | Status: DC

## 2013-08-11 MED ORDER — ROSUVASTATIN CALCIUM 20 MG PO TABS: 20.0000 mg | ORAL_TABLET | Freq: Every day | ORAL | Status: DC

## 2013-08-11 MED ORDER — ACCU-CHEK MULTICLIX LANCETS MISC: Status: DC

## 2013-08-11 MED ORDER — METFORMIN HCL 500 MG PO TABS: 500.0000 mg | ORAL_TABLET | Freq: Two times a day (BID) | ORAL | Status: DC

## 2013-08-11 NOTE — Progress Notes (Signed)
  Subjective:    Patient ID: Diana Parsons, female    DOB: [birth year], 79 y.o.   MRN: [id number]  HPI Having a lot of pain in her back. Dx with Spinal stenosis. Seeing Dr. Alden Hipp.  Can't stand for more than 3 min. They are planning surgery in 1 months.  Leg have been giving out.  Has been more constipated and having more thin stools.  She is using miralax and stool softernes. Last colonoscopy was 05/2012.  Due for f/u in year. Her pain has caused her to cry at times. She is not currently taking anything for pain, prescription or over-the-counter. She says she's mostly been icing it.  DM- no hypoglycemic events.  No wounds that aren't healing well.  Sugars running 120s at home.  She takes her metformin regularly. She does need a refill on her lancets and strips. Review of Systems     Objective:   Physical Exam  Constitutional: She is oriented to person, place, and time. She appears well-developed and well-nourished.  HENT:  Head: Normocephalic and atraumatic.  Cardiovascular: Normal rate and regular rhythm.   Murmur heard. 2/6 SEM best heard the the right sternal border  Pulmonary/Chest: Effort normal and breath sounds normal.  Neurological: She is alert and oriented to person, place, and time.  Skin: Skin is warm and dry.  Psychiatric: She has a normal mood and affect. Her behavior is normal.          Assessment & Plan:  DM- well controlled.  On ASA, Crestor.  She is 6.2 today.  F/U in 3 months. Refilled medications today. Lab Results  Component Value Date   HGBA1C 6.8 05/11/2013     Spinal stenosis - offered to give her something for pain. She delcined and says will try her NSAIDs for now. She can call at anytime.  Flu shot given today.

## 2013-08-30 DIAGNOSIS — M48061 Spinal stenosis, lumbar region without neurogenic claudication: Secondary | ICD-10-CM | POA: Insufficient documentation

## 2013-09-03 DIAGNOSIS — Z9889 Other specified postprocedural states: Secondary | ICD-10-CM | POA: Insufficient documentation

## 2013-11-03 ENCOUNTER — Encounter: Payer: Self-pay | Admitting: Family Medicine

## 2013-11-12 ENCOUNTER — Ambulatory Visit (INDEPENDENT_AMBULATORY_CARE_PROVIDER_SITE_OTHER): Payer: Medicare Other | Admitting: Family Medicine

## 2013-11-12 ENCOUNTER — Encounter: Payer: Self-pay | Admitting: Family Medicine

## 2013-11-12 VITALS — BP 126/60 | HR 85 | Temp 98.5°F | Ht 65.75 in | Wt 232.0 lb

## 2013-11-12 DIAGNOSIS — E785 Hyperlipidemia, unspecified: Secondary | ICD-10-CM

## 2013-11-12 DIAGNOSIS — E119 Type 2 diabetes mellitus without complications: Secondary | ICD-10-CM

## 2013-11-12 DIAGNOSIS — Z23 Encounter for immunization: Secondary | ICD-10-CM

## 2013-11-12 LAB — POCT GLYCOSYLATED HEMOGLOBIN (HGB A1C): HEMOGLOBIN A1C: 6.6

## 2013-11-12 MED ORDER — METFORMIN HCL 500 MG PO TABS: 500.0000 mg | ORAL_TABLET | Freq: Two times a day (BID) | ORAL | Status: DC

## 2013-11-12 MED ORDER — ROSUVASTATIN CALCIUM 20 MG PO TABS
20.0000 mg | ORAL_TABLET | Freq: Every day | ORAL | Status: DC
Start: 2013-11-12 — End: 2014-06-18

## 2013-11-12 MED ORDER — GLUCOSE BLOOD VI STRP
ORAL_STRIP | Status: DC
Start: 2013-11-12 — End: 2014-09-24

## 2013-11-12 MED ORDER — ACCU-CHEK MULTICLIX LANCETS MISC: Status: DC

## 2013-11-12 NOTE — Progress Notes (Signed)
   Subjective:    Patient ID: Diana Parsons, female    DOB: 1934-06-07, 80 y.o.   MRN: [id number]  HPI Diabetes - no hypoglycemic events. No wounds or sores that are not healing well. No increased thirst or urination. Checking glucose at home. Taking medications as prescribed without any side effects.  Had her back surgery. She is back at work and has felt much better overall.    Hyperlipidemia-on her last lipid panel she actually quit taking her Crestor. We called her to see if she was still taking it she was not. She says she is back on it now. Tolerating it well without any side effects.  Review of Systems     Objective:   Physical Exam  Constitutional: She is oriented to person, place, and time. She appears well-developed and well-nourished.  HENT:  Head: Normocephalic and atraumatic.  Cardiovascular: Normal rate, regular rhythm and normal heart sounds.   Pulmonary/Chest: Effort normal and breath sounds normal.  Neurological: She is alert and oriented to person, place, and time.  Skin: Skin is warm and dry.  Psychiatric: She has a normal mood and affect. Her behavior is normal.          Assessment & Plan:  Diabetes-Well controlled but up a little from last time.  She was less active after her back surgery. She is on aspirin, statin. She is not on ACE inhibitor but she does not have renal disease or high blood pressure. Lab Results  Component Value Date   HGBA1C 6.2 08/11/2013   Tdap updated today.   Hyperlipidemia-recheck lipid panel now that she is back on her Crestor regularly.

## 2013-11-12 NOTE — Addendum Note (Signed)
Addended by: Teddy Spike on: 11/12/2013 03:31 PM   Modules accepted: Orders

## 2014-02-12 ENCOUNTER — Encounter: Payer: Self-pay | Admitting: Family Medicine

## 2014-02-12 ENCOUNTER — Ambulatory Visit (INDEPENDENT_AMBULATORY_CARE_PROVIDER_SITE_OTHER): Payer: Medicare Other | Admitting: Family Medicine

## 2014-02-12 VITALS — BP 122/68 | HR 84 | Wt 231.0 lb

## 2014-02-12 DIAGNOSIS — IMO0001 Reserved for inherently not codable concepts without codable children: Secondary | ICD-10-CM

## 2014-02-12 DIAGNOSIS — R030 Elevated blood-pressure reading, without diagnosis of hypertension: Secondary | ICD-10-CM

## 2014-02-12 DIAGNOSIS — E119 Type 2 diabetes mellitus without complications: Secondary | ICD-10-CM

## 2014-02-12 LAB — POCT GLYCOSYLATED HEMOGLOBIN (HGB A1C): Hemoglobin A1C: 6.7

## 2014-02-12 MED ORDER — METFORMIN HCL 500 MG PO TABS: 500.0000 mg | ORAL_TABLET | Freq: Two times a day (BID) | ORAL | Status: DC

## 2014-02-12 NOTE — Progress Notes (Signed)
   Subjective:    Patient ID: Diana Parsons, female    DOB: 1934/01/16, 80 y.o.   MRN: [id number]  HPI Diabetes - no hypoglycemic events. No wounds or sores that are not healing well. No increased thirst or urination. Checking glucose at home. Taking medications as prescribed without any side effects. She is physically active. She has been eating better overall.   Review of Systems     Objective:   Physical Exam  Constitutional: She is oriented to person, place, and time. She appears well-developed and well-nourished.  HENT:  Head: Normocephalic and atraumatic.  Cardiovascular: Normal rate, regular rhythm and normal heart sounds.   Pulmonary/Chest: Effort normal and breath sounds normal.  Neurological: She is alert and oriented to person, place, and time.  Skin: Skin is warm and dry.  Psychiatric: She has a normal mood and affect. Her behavior is normal.          Assessment & Plan:  DM- well controlled. Continue current regimen. F/U in 4 months.  Encouraged her to get more regular exercise and continue work on her diet and avoid skipping meals which she does sometimes. On a statin and ASA.  A1C at 6.7.    Elevated BP - repeat blood pressure looks much better today.

## 2014-02-12 NOTE — Patient Instructions (Signed)
Recommend 30 min of exercise at least 5 days per week. Can split up the exercise into 10 or 15 minute increments.

## 2014-05-03 ENCOUNTER — Ambulatory Visit (INDEPENDENT_AMBULATORY_CARE_PROVIDER_SITE_OTHER): Payer: Medicare Other

## 2014-05-03 ENCOUNTER — Encounter: Payer: Self-pay | Admitting: Sports Medicine

## 2014-05-03 ENCOUNTER — Ambulatory Visit (INDEPENDENT_AMBULATORY_CARE_PROVIDER_SITE_OTHER): Payer: Medicare Other | Admitting: Sports Medicine

## 2014-05-03 VITALS — BP 141/62 | HR 89 | Ht 65.0 in | Wt 238.0 lb

## 2014-05-03 DIAGNOSIS — M51379 Other intervertebral disc degeneration, lumbosacral region without mention of lumbar back pain or lower extremity pain: Secondary | ICD-10-CM

## 2014-05-03 DIAGNOSIS — M5136 Other intervertebral disc degeneration, lumbar region: Secondary | ICD-10-CM

## 2014-05-03 DIAGNOSIS — M51369 Other intervertebral disc degeneration, lumbar region without mention of lumbar back pain or lower extremity pain: Secondary | ICD-10-CM | POA: Insufficient documentation

## 2014-05-03 DIAGNOSIS — M47817 Spondylosis without myelopathy or radiculopathy, lumbosacral region: Secondary | ICD-10-CM

## 2014-05-03 DIAGNOSIS — M5137 Other intervertebral disc degeneration, lumbosacral region: Secondary | ICD-10-CM

## 2014-05-03 MED ORDER — PREDNISONE (PAK) 10 MG PO TABS
ORAL_TABLET | ORAL | Status: DC
Start: 2014-05-03 — End: 2014-06-18

## 2014-05-03 NOTE — Assessment & Plan Note (Signed)
Symptoms are referable to the left L4 nerve root. She is post laminotomy and discectomy at sounds. X-rays, formal PT, prednisone taper. Return in 4 weeks, MRI with IV contrast for interventional injection planning if no better.

## 2014-05-03 NOTE — Progress Notes (Signed)
  Subjective:    CC: Hip Pain  HPI: Patient is a 80 year old woman with history of prior low back disc problems and radicular nerve pain, for which she has received surgical intervention, currently in the clinic to evaluate pain in her left hip. She says that the pain is worst in the back and wrapping around just over the edge of the trochanter, but frequently radiates down and across the front of the leg to the medial aspect of the knee. She says that the pain is worse with standing, coughing, and sneezing, but better when she leans forward onto something. Further, she says that the pain is worse after several days without a bowel movement, and is relieved afterwards. She denies fever, chills, night sweats, and any gain or loss of weight. Pain is moderate, persistent  Past medical history, Surgical history, Family history not pertinant except as noted below, Social history, Allergies, and medications have been entered into the medical record, reviewed, and no changes needed.   Review of Systems: No fevers, chills, night sweats, weight loss, chest pain, or shortness of breath.   Objective:    General: Well Developed, well nourished, and in no acute distress.  Neuro: Alert and oriented x3, extra-ocular muscles intact, sensation grossly intact.  HEENT: Normocephalic, atraumatic, pupils equal round reactive to light, neck supple, no masses, no lymphadenopathy, thyroid nonpalpable.  Skin: Warm and dry, no rashes. Respiratory: Not using accessory muscles, speaking in full sentences. GI: Not distended, normal shape, normal bowel sounds, several focal points of tenderness to deep palpation, but non-reproducible Left Hip: ROM IR: 45 Deg, ER: 45 Deg, Flexion: 120 Deg, Extension: 100 Deg, Abduction: 45 Deg, Adduction: 45 Deg Strength IR: 5/5, ER: 5/5, Flexion: 5/5, Extension: 5/5, Abduction: 5/5, Adduction: 5/5 Pelvic alignment unremarkable to inspection and palpation. Standing hip rotation and gait  without trendelenburg sign / unsteadiness. Greater trochanter with guarding but without tenderness to palpation. Pain with FADIR. No SI joint tenderness and normal minimal SI movement.  Impression and Recommendations:    Given radiation in an L3 distribution, benign hip exam, and prior history of lumbar radiculopathy, it is felt that lumbar radiculopathy continues to be the most likely diagnosis. She will be placed on a short course of prednisone and given physical therapy and imaging to help evaluate her spinal pathology.

## 2014-05-06 ENCOUNTER — Ambulatory Visit (INDEPENDENT_AMBULATORY_CARE_PROVIDER_SITE_OTHER): Payer: Medicare Other

## 2014-05-06 DIAGNOSIS — M5137 Other intervertebral disc degeneration, lumbosacral region: Secondary | ICD-10-CM

## 2014-05-10 ENCOUNTER — Encounter (INDEPENDENT_AMBULATORY_CARE_PROVIDER_SITE_OTHER): Payer: Medicare Other | Admitting: Physical Therapy

## 2014-05-10 DIAGNOSIS — R293 Abnormal posture: Secondary | ICD-10-CM

## 2014-05-10 DIAGNOSIS — M6281 Muscle weakness (generalized): Secondary | ICD-10-CM

## 2014-05-10 DIAGNOSIS — M51379 Other intervertebral disc degeneration, lumbosacral region without mention of lumbar back pain or lower extremity pain: Secondary | ICD-10-CM

## 2014-05-10 DIAGNOSIS — R262 Difficulty in walking, not elsewhere classified: Secondary | ICD-10-CM

## 2014-05-10 DIAGNOSIS — M5137 Other intervertebral disc degeneration, lumbosacral region: Secondary | ICD-10-CM

## 2014-05-12 ENCOUNTER — Encounter (INDEPENDENT_AMBULATORY_CARE_PROVIDER_SITE_OTHER): Payer: Medicare Other | Admitting: Physical Therapy

## 2014-05-12 DIAGNOSIS — M5137 Other intervertebral disc degeneration, lumbosacral region: Secondary | ICD-10-CM

## 2014-05-12 DIAGNOSIS — R293 Abnormal posture: Secondary | ICD-10-CM

## 2014-05-12 DIAGNOSIS — R262 Difficulty in walking, not elsewhere classified: Secondary | ICD-10-CM

## 2014-05-12 DIAGNOSIS — M6281 Muscle weakness (generalized): Secondary | ICD-10-CM

## 2014-05-17 ENCOUNTER — Encounter: Payer: Medicare Other | Admitting: Physical Therapy

## 2014-05-19 ENCOUNTER — Encounter (INDEPENDENT_AMBULATORY_CARE_PROVIDER_SITE_OTHER): Payer: Medicare Other

## 2014-05-19 DIAGNOSIS — M5137 Other intervertebral disc degeneration, lumbosacral region: Secondary | ICD-10-CM

## 2014-05-19 DIAGNOSIS — M51379 Other intervertebral disc degeneration, lumbosacral region without mention of lumbar back pain or lower extremity pain: Secondary | ICD-10-CM

## 2014-05-19 DIAGNOSIS — R293 Abnormal posture: Secondary | ICD-10-CM

## 2014-05-19 DIAGNOSIS — M6281 Muscle weakness (generalized): Secondary | ICD-10-CM

## 2014-05-19 DIAGNOSIS — R262 Difficulty in walking, not elsewhere classified: Secondary | ICD-10-CM

## 2014-05-21 ENCOUNTER — Encounter (INDEPENDENT_AMBULATORY_CARE_PROVIDER_SITE_OTHER): Payer: Medicare Other | Admitting: Physical Therapy

## 2014-05-21 DIAGNOSIS — R293 Abnormal posture: Secondary | ICD-10-CM

## 2014-05-21 DIAGNOSIS — R262 Difficulty in walking, not elsewhere classified: Secondary | ICD-10-CM

## 2014-05-21 DIAGNOSIS — M5137 Other intervertebral disc degeneration, lumbosacral region: Secondary | ICD-10-CM

## 2014-05-21 DIAGNOSIS — M6281 Muscle weakness (generalized): Secondary | ICD-10-CM

## 2014-05-24 ENCOUNTER — Encounter (INDEPENDENT_AMBULATORY_CARE_PROVIDER_SITE_OTHER): Payer: Medicare Other | Admitting: Physical Therapy

## 2014-05-24 DIAGNOSIS — R293 Abnormal posture: Secondary | ICD-10-CM

## 2014-05-24 DIAGNOSIS — M5137 Other intervertebral disc degeneration, lumbosacral region: Secondary | ICD-10-CM

## 2014-05-24 DIAGNOSIS — M6281 Muscle weakness (generalized): Secondary | ICD-10-CM

## 2014-05-24 DIAGNOSIS — R262 Difficulty in walking, not elsewhere classified: Secondary | ICD-10-CM

## 2014-05-26 ENCOUNTER — Encounter (INDEPENDENT_AMBULATORY_CARE_PROVIDER_SITE_OTHER): Payer: Medicare Other

## 2014-05-26 DIAGNOSIS — M5137 Other intervertebral disc degeneration, lumbosacral region: Secondary | ICD-10-CM

## 2014-05-26 DIAGNOSIS — M6281 Muscle weakness (generalized): Secondary | ICD-10-CM

## 2014-05-26 DIAGNOSIS — R269 Unspecified abnormalities of gait and mobility: Secondary | ICD-10-CM

## 2014-05-26 DIAGNOSIS — R262 Difficulty in walking, not elsewhere classified: Secondary | ICD-10-CM

## 2014-05-27 ENCOUNTER — Encounter: Payer: Self-pay | Admitting: Family Medicine

## 2014-05-31 ENCOUNTER — Encounter: Payer: Self-pay | Admitting: Sports Medicine

## 2014-05-31 ENCOUNTER — Ambulatory Visit (INDEPENDENT_AMBULATORY_CARE_PROVIDER_SITE_OTHER): Payer: Medicare Other | Admitting: Sports Medicine

## 2014-05-31 ENCOUNTER — Encounter (INDEPENDENT_AMBULATORY_CARE_PROVIDER_SITE_OTHER): Payer: Medicare Other | Admitting: Physical Therapy

## 2014-05-31 VITALS — BP 140/61 | HR 90 | Ht 66.0 in | Wt 240.0 lb

## 2014-05-31 DIAGNOSIS — M51369 Other intervertebral disc degeneration, lumbar region without mention of lumbar back pain or lower extremity pain: Secondary | ICD-10-CM

## 2014-05-31 DIAGNOSIS — R293 Abnormal posture: Secondary | ICD-10-CM

## 2014-05-31 DIAGNOSIS — M5137 Other intervertebral disc degeneration, lumbosacral region: Secondary | ICD-10-CM

## 2014-05-31 DIAGNOSIS — R262 Difficulty in walking, not elsewhere classified: Secondary | ICD-10-CM

## 2014-05-31 DIAGNOSIS — M5136 Other intervertebral disc degeneration, lumbar region: Secondary | ICD-10-CM

## 2014-05-31 DIAGNOSIS — M6281 Muscle weakness (generalized): Secondary | ICD-10-CM

## 2014-05-31 DIAGNOSIS — M51379 Other intervertebral disc degeneration, lumbosacral region without mention of lumbar back pain or lower extremity pain: Secondary | ICD-10-CM

## 2014-05-31 NOTE — Progress Notes (Signed)
  Subjective:    CC: Followup  HPI: Lumbar spondylosis: Hollye had a fantastic response with physical therapy, radicular symptoms have resolved, she continues to have some pain that she localizes in the midline along the lower lumbar vertebrae and sacrum, worse with standing up straight and leaning back. She does have an appointment coming up with her surgeon, she has a history of multilevel discectomy, I've advised her to followup with me should he not have any options for further pain relief. Symptoms are mild, persistent. She is continuing PT which I think is appropriate.  Past medical history, Surgical history, Family history not pertinant except as noted below, Social history, Allergies, and medications have been entered into the medical record, reviewed, and no changes needed.   Review of Systems: No fevers, chills, night sweats, weight loss, chest pain, or shortness of breath.   Objective:    General: Well Developed, well nourished, and in no acute distress.  Neuro: Alert and oriented x3, extra-ocular muscles intact, sensation grossly intact.  HEENT: Normocephalic, atraumatic, pupils equal round reactive to light, neck supple, no masses, no lymphadenopathy, thyroid nonpalpable.  Skin: Warm and dry, no rashes. Cardiac: Regular rate and rhythm, no murmurs rubs or gallops, no lower extremity edema.  Respiratory: Clear to auscultation bilaterally. Not using accessory muscles, speaking in full sentences. Low back: Tender to palpation in the midline of the upper sacrum, no tenderness to palpation over the sacroiliac joints.  Impression and Recommendations:

## 2014-05-31 NOTE — Assessment & Plan Note (Signed)
Significantly improved with formal PT and prednisone. Pain now is predominantly axial and referable to the SI joint versus lower lumbar facets. She is going to see her spine surgeon which is appropriate, certainly if he concurs that this is excised versus facet we can proceed with injection if SI versus L. spine MRI with IV contrast and facet injection afterwards. Return as needed for this.

## 2014-06-03 ENCOUNTER — Encounter (INDEPENDENT_AMBULATORY_CARE_PROVIDER_SITE_OTHER): Payer: Medicare Other | Admitting: Physical Therapy

## 2014-06-03 DIAGNOSIS — R262 Difficulty in walking, not elsewhere classified: Secondary | ICD-10-CM

## 2014-06-03 DIAGNOSIS — M5137 Other intervertebral disc degeneration, lumbosacral region: Secondary | ICD-10-CM

## 2014-06-03 DIAGNOSIS — R293 Abnormal posture: Secondary | ICD-10-CM

## 2014-06-03 DIAGNOSIS — M6281 Muscle weakness (generalized): Secondary | ICD-10-CM

## 2014-06-07 ENCOUNTER — Encounter (INDEPENDENT_AMBULATORY_CARE_PROVIDER_SITE_OTHER): Payer: Medicare Other | Admitting: Physical Therapy

## 2014-06-07 DIAGNOSIS — M5137 Other intervertebral disc degeneration, lumbosacral region: Secondary | ICD-10-CM

## 2014-06-07 DIAGNOSIS — R293 Abnormal posture: Secondary | ICD-10-CM

## 2014-06-07 DIAGNOSIS — R262 Difficulty in walking, not elsewhere classified: Secondary | ICD-10-CM

## 2014-06-07 DIAGNOSIS — M6281 Muscle weakness (generalized): Secondary | ICD-10-CM

## 2014-06-11 ENCOUNTER — Other Ambulatory Visit: Payer: Medicare Other | Admitting: Family Medicine

## 2014-06-11 ENCOUNTER — Ambulatory Visit (INDEPENDENT_AMBULATORY_CARE_PROVIDER_SITE_OTHER): Payer: Medicare Other | Admitting: Physical Therapy

## 2014-06-11 DIAGNOSIS — M25559 Pain in unspecified hip: Secondary | ICD-10-CM

## 2014-06-11 DIAGNOSIS — M6281 Muscle weakness (generalized): Secondary | ICD-10-CM

## 2014-06-11 DIAGNOSIS — M545 Low back pain, unspecified: Secondary | ICD-10-CM

## 2014-06-12 LAB — COMPLETE METABOLIC PANEL WITH GFR
ALBUMIN: 4.1 g/dL (ref 3.5–5.2)
ALT: 20 U/L (ref 0–35)
AST: 20 U/L (ref 0–37)
Alkaline Phosphatase: 71 U/L (ref 39–117)
BUN: 14 mg/dL (ref 6–23)
CALCIUM: 8.9 mg/dL (ref 8.4–10.5)
CHLORIDE: 104 meq/L (ref 96–112)
CO2: 26 meq/L (ref 19–32)
Creat: 0.7 mg/dL (ref 0.50–1.10)
GFR, Est Non African American: 84 mL/min
Glucose, Bld: 139 mg/dL — ABNORMAL HIGH (ref 70–99)
Potassium: 4.7 mEq/L (ref 3.5–5.3)
SODIUM: 140 meq/L (ref 135–145)
TOTAL PROTEIN: 6.6 g/dL (ref 6.0–8.3)
Total Bilirubin: 0.3 mg/dL (ref 0.2–1.2)

## 2014-06-12 LAB — LIPID PANEL
Cholesterol: 120 mg/dL (ref 0–200)
HDL: 44 mg/dL (ref >39)
LDL CALC: 51 mg/dL (ref 0–99)
Total CHOL/HDL Ratio: 2.7 Ratio
Triglycerides: 123 mg/dL (ref <150)
VLDL: 25 mg/dL (ref 0–40)

## 2014-06-14 ENCOUNTER — Encounter (INDEPENDENT_AMBULATORY_CARE_PROVIDER_SITE_OTHER): Payer: Medicare Other | Admitting: Physical Therapy

## 2014-06-14 DIAGNOSIS — M25519 Pain in unspecified shoulder: Secondary | ICD-10-CM

## 2014-06-14 DIAGNOSIS — M75100 Unspecified rotator cuff tear or rupture of unspecified shoulder, not specified as traumatic: Secondary | ICD-10-CM

## 2014-06-14 DIAGNOSIS — M25619 Stiffness of unspecified shoulder, not elsewhere classified: Secondary | ICD-10-CM

## 2014-06-14 DIAGNOSIS — M6281 Muscle weakness (generalized): Secondary | ICD-10-CM

## 2014-06-14 DIAGNOSIS — IMO0002 Reserved for concepts with insufficient information to code with codable children: Secondary | ICD-10-CM

## 2014-06-14 DIAGNOSIS — R609 Edema, unspecified: Secondary | ICD-10-CM

## 2014-06-17 ENCOUNTER — Encounter (INDEPENDENT_AMBULATORY_CARE_PROVIDER_SITE_OTHER): Payer: Medicare Other | Admitting: Physical Therapy

## 2014-06-17 DIAGNOSIS — M545 Low back pain, unspecified: Secondary | ICD-10-CM

## 2014-06-17 DIAGNOSIS — M6281 Muscle weakness (generalized): Secondary | ICD-10-CM

## 2014-06-17 DIAGNOSIS — M25559 Pain in unspecified hip: Secondary | ICD-10-CM

## 2014-06-17 DIAGNOSIS — M549 Dorsalgia, unspecified: Secondary | ICD-10-CM

## 2014-06-17 DIAGNOSIS — M79609 Pain in unspecified limb: Secondary | ICD-10-CM

## 2014-06-18 ENCOUNTER — Ambulatory Visit (INDEPENDENT_AMBULATORY_CARE_PROVIDER_SITE_OTHER): Payer: Medicare Other | Admitting: Family Medicine

## 2014-06-18 ENCOUNTER — Encounter: Payer: Self-pay | Admitting: Family Medicine

## 2014-06-18 VITALS — BP 134/76 | HR 88 | Wt 241.0 lb

## 2014-06-18 DIAGNOSIS — M545 Low back pain, unspecified: Secondary | ICD-10-CM

## 2014-06-18 DIAGNOSIS — Z23 Encounter for immunization: Secondary | ICD-10-CM

## 2014-06-18 DIAGNOSIS — E119 Type 2 diabetes mellitus without complications: Secondary | ICD-10-CM

## 2014-06-18 LAB — POCT GLYCOSYLATED HEMOGLOBIN (HGB A1C): HEMOGLOBIN A1C: 7.2

## 2014-06-18 LAB — POCT UA - MICROALBUMIN
CREATININE, POC: 50 mg/dL
Microalbumin Ur, POC: 10 mg/L

## 2014-06-18 MED ORDER — ACCU-CHEK MULTICLIX LANCETS MISC: Status: DC

## 2014-06-18 MED ORDER — ROSUVASTATIN CALCIUM 20 MG PO TABS: 20.0000 mg | ORAL_TABLET | Freq: Every day | ORAL | Status: DC

## 2014-06-18 NOTE — Progress Notes (Signed)
   Subjective:    Patient ID: Diana Parsons, female    DOB: 1934-08-23, 80 y.o.   MRN: [id number]  Diabetes   Diabetes - no hypoglycemic events. No wounds or sores that are not healing well. No increased thirst or urination. Checking glucose at home. Taking medications as prescribed without any side effects. hasnt been as active because of her back pain.  Says her diet is not helping. Due for foot exam today. Was on 12 days of prednisone as well.    Still having low back pain. Saw Dr. Darene Lamer and he put her in therapy.  She is seeing her surgeon, Dr. Lurene Shadow now.  She is getting some better. She has also been very constipated so has an appt with GI for sigmoidoscopy.   Review of Systems     Objective:   Physical Exam  Constitutional: She is oriented to person, place, and time. She appears well-developed and well-nourished.  HENT:  Head: Normocephalic and atraumatic.  Cardiovascular: Normal rate, regular rhythm and normal heart sounds.   Pulmonary/Chest: Effort normal and breath sounds normal.  Neurological: She is alert and oriented to person, place, and time.  Skin: Skin is warm and dry.  Psychiatric: She has a normal mood and affect. Her behavior is normal.          Assessment & Plan:

## 2014-06-18 NOTE — Assessment & Plan Note (Addendum)
Diabetic foot exam performed today. Urine microalbumin performed today. Flu vaccine discussed today. She is on a statin and a baby aspirin daily. Lipid levels are up to date. LDL is at goal at 51. Does have protein in urine today. Will recheck at f/u if A1C is down.  She wants to start walking again for exercise.  She really wants to work on this first before we go up on her medication. Think it's reasonable to give her 3 more months to work on this. If her A1c is not at goal at followup visit and she does understand we will need to adjust her medication regimen.

## 2014-06-23 ENCOUNTER — Encounter (INDEPENDENT_AMBULATORY_CARE_PROVIDER_SITE_OTHER): Payer: Medicare Other | Admitting: Physical Therapy

## 2014-06-23 DIAGNOSIS — M79609 Pain in unspecified limb: Secondary | ICD-10-CM

## 2014-06-23 DIAGNOSIS — M545 Low back pain, unspecified: Secondary | ICD-10-CM

## 2014-06-23 DIAGNOSIS — M25559 Pain in unspecified hip: Secondary | ICD-10-CM

## 2014-06-23 DIAGNOSIS — M549 Dorsalgia, unspecified: Secondary | ICD-10-CM

## 2014-06-23 DIAGNOSIS — M6281 Muscle weakness (generalized): Secondary | ICD-10-CM

## 2014-06-28 ENCOUNTER — Encounter (INDEPENDENT_AMBULATORY_CARE_PROVIDER_SITE_OTHER): Payer: Medicare Other | Admitting: Physical Therapy

## 2014-06-28 DIAGNOSIS — M25559 Pain in unspecified hip: Secondary | ICD-10-CM

## 2014-06-28 DIAGNOSIS — M545 Low back pain, unspecified: Secondary | ICD-10-CM

## 2014-06-28 DIAGNOSIS — M79609 Pain in unspecified limb: Secondary | ICD-10-CM

## 2014-06-28 DIAGNOSIS — M6281 Muscle weakness (generalized): Secondary | ICD-10-CM

## 2014-06-28 DIAGNOSIS — M5380 Other specified dorsopathies, site unspecified: Secondary | ICD-10-CM

## 2014-07-02 ENCOUNTER — Encounter: Payer: Medicare Other | Admitting: Physical Therapy

## 2014-07-05 ENCOUNTER — Encounter (INDEPENDENT_AMBULATORY_CARE_PROVIDER_SITE_OTHER): Payer: Medicare Other | Admitting: Physical Therapy

## 2014-07-05 DIAGNOSIS — M25559 Pain in unspecified hip: Secondary | ICD-10-CM

## 2014-07-05 DIAGNOSIS — M549 Dorsalgia, unspecified: Secondary | ICD-10-CM

## 2014-07-05 DIAGNOSIS — M545 Low back pain, unspecified: Secondary | ICD-10-CM

## 2014-07-05 DIAGNOSIS — M6281 Muscle weakness (generalized): Secondary | ICD-10-CM

## 2014-07-09 ENCOUNTER — Encounter: Payer: Medicare Other | Admitting: Physical Therapy

## 2014-07-14 ENCOUNTER — Encounter (INDEPENDENT_AMBULATORY_CARE_PROVIDER_SITE_OTHER): Payer: Medicare Other | Admitting: Physical Therapy

## 2014-07-14 DIAGNOSIS — M6281 Muscle weakness (generalized): Secondary | ICD-10-CM

## 2014-07-14 DIAGNOSIS — M545 Low back pain, unspecified: Secondary | ICD-10-CM

## 2014-07-14 DIAGNOSIS — M25559 Pain in unspecified hip: Secondary | ICD-10-CM

## 2014-07-14 DIAGNOSIS — M549 Dorsalgia, unspecified: Secondary | ICD-10-CM

## 2014-07-14 DIAGNOSIS — M79609 Pain in unspecified limb: Secondary | ICD-10-CM

## 2014-07-15 HISTORY — PX: OTHER SURGICAL HISTORY: SHX169

## 2014-08-13 DIAGNOSIS — E669 Obesity, unspecified: Secondary | ICD-10-CM | POA: Insufficient documentation

## 2014-09-16 ENCOUNTER — Other Ambulatory Visit: Payer: Self-pay | Admitting: Family Medicine

## 2014-09-17 ENCOUNTER — Ambulatory Visit: Payer: Medicare Other | Admitting: Family Medicine

## 2014-09-24 ENCOUNTER — Ambulatory Visit (INDEPENDENT_AMBULATORY_CARE_PROVIDER_SITE_OTHER): Payer: Medicare Other | Admitting: Family Medicine

## 2014-09-24 ENCOUNTER — Encounter: Payer: Self-pay | Admitting: Family Medicine

## 2014-09-24 VITALS — BP 134/87 | HR 98 | Wt 241.0 lb

## 2014-09-24 DIAGNOSIS — E1129 Type 2 diabetes mellitus with other diabetic kidney complication: Secondary | ICD-10-CM

## 2014-09-24 DIAGNOSIS — Z23 Encounter for immunization: Secondary | ICD-10-CM

## 2014-09-24 DIAGNOSIS — R809 Proteinuria, unspecified: Secondary | ICD-10-CM

## 2014-09-24 DIAGNOSIS — E119 Type 2 diabetes mellitus without complications: Secondary | ICD-10-CM

## 2014-09-24 LAB — POCT UA - MICROALBUMIN
Albumin/Creatinine Ratio, Urine, POC: <30
Creatinine, POC: 50 mg/dL
Microalbumin Ur, POC: 10 mg/L

## 2014-09-24 LAB — POCT GLYCOSYLATED HEMOGLOBIN (HGB A1C): Hemoglobin A1C: 6.6

## 2014-09-24 MED ORDER — GLUCOSE BLOOD VI STRP: ORAL_STRIP | Status: DC

## 2014-09-24 MED ORDER — ACCU-CHEK MULTICLIX LANCETS MISC: Status: DC

## 2014-09-24 MED ORDER — ACCU-CHEK AVIVA DEVI: Status: DC

## 2014-09-24 NOTE — Progress Notes (Signed)
   Subjective:    Patient ID: Diana Parsons, female    DOB: 12-15-1933, 80 y.o.   MRN: [id number]  HPI Diabetes - no hypoglycemic events. No wounds or sores that are not healing well. No increased thirst or urination. Checking glucose at home. Taking medications as prescribed without any side effects.  Micro Albumin 06/28/2014, HgBA1C today, Foot exam 06/18/2014, Due for Eye exam  Had surgery in October in lumbar spine. Says saw Dr. Lurene Shadow. She did well and she is now doing home physical therapy.  Sugars were up and down around the time of surgery. We will call to get records.     Review of Systems     Objective:   Physical Exam  Constitutional: She is oriented to person, place, and time. She appears well-developed and well-nourished.  HENT:  Head: Normocephalic and atraumatic.  Cardiovascular: Normal rate, regular rhythm and normal heart sounds.   Pulmonary/Chest: Effort normal and breath sounds normal.  Neurological: She is alert and oriented to person, place, and time.  Skin: Skin is warm and dry.  Psychiatric: She has a normal mood and affect. Her behavior is normal.          Assessment & Plan:  DM with microalbuminuria-discussed the need to start an ACE inhibitor. She was somewhat reluctant. We di drecheck her microalbumin since her A1c is down. An was normal today. We'll hold off on ACE inhibitor but will monitor carefully. She is Re: On a statin. A1c looks fantastic today at 6.6. Continueue current regimen of metformin. F/U in 3 months.   Given Prenvar today.

## 2014-09-27 ENCOUNTER — Telehealth: Payer: Self-pay | Admitting: *Deleted

## 2014-09-27 NOTE — Telephone Encounter (Signed)
Error

## 2014-12-24 ENCOUNTER — Ambulatory Visit: Payer: Medicare Other | Admitting: Family Medicine

## 2015-02-23 ENCOUNTER — Ambulatory Visit (INDEPENDENT_AMBULATORY_CARE_PROVIDER_SITE_OTHER): Payer: Medicare Other | Admitting: Family Medicine

## 2015-02-23 ENCOUNTER — Encounter: Payer: Self-pay | Admitting: Family Medicine

## 2015-02-23 VITALS — BP 137/61 | HR 90 | Wt 250.0 lb

## 2015-02-23 DIAGNOSIS — E785 Hyperlipidemia, unspecified: Secondary | ICD-10-CM

## 2015-02-23 DIAGNOSIS — E119 Type 2 diabetes mellitus without complications: Secondary | ICD-10-CM

## 2015-02-23 LAB — POCT GLYCOSYLATED HEMOGLOBIN (HGB A1C): HEMOGLOBIN A1C: 7.1

## 2015-02-23 MED ORDER — ROSUVASTATIN CALCIUM 20 MG PO TABS: 20.0000 mg | ORAL_TABLET | Freq: Every day | ORAL | Status: DC

## 2015-02-23 MED ORDER — METFORMIN HCL 1000 MG PO TABS: 1000.0000 mg | ORAL_TABLET | Freq: Two times a day (BID) | ORAL | Status: DC

## 2015-02-23 NOTE — Progress Notes (Signed)
   Subjective:    Patient ID: Diana Parsons, female    DOB: 09/05/1934, 81 y.o.   MRN: [id number]  HPI Diabetes - no hypoglycemic events. No wounds or sores that are not healing well. No increased thirst or urination. Checking glucose at home. Taking medications as prescribed without any side effects. I reminded her last office visit to get her eye exam updated.  Hyperlipidemia - she is doig well on crestor.  No myalgias or S.e.   Review of Systems     Objective:   Physical Exam  Constitutional: She is oriented to person, place, and time. She appears well-developed and well-nourished.  HENT:  Head: Normocephalic and atraumatic.  Cardiovascular: Normal rate, regular rhythm and normal heart sounds.   Pulmonary/Chest: Effort normal and breath sounds normal.  Neurological: She is alert and oriented to person, place, and time.  Skin: Skin is warm and dry.  Psychiatric: She has a normal mood and affect. Her behavior is normal.          Assessment & Plan:  DM- uncontrolled. A1C is over 7.  It is 7.1. She has gained 9 lbs.  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  She plans on starting at the gym again. Increase metformin to 100mg  BID.   Remind reminded her to get yearly eye exam.  Hyperlipidemia- continue Crestor. Refilled today. Due for CMP and lipids in August.

## 2015-05-24 ENCOUNTER — Ambulatory Visit (INDEPENDENT_AMBULATORY_CARE_PROVIDER_SITE_OTHER): Payer: Medicare Other | Admitting: Family Medicine

## 2015-05-24 ENCOUNTER — Encounter: Payer: Self-pay | Admitting: Family Medicine

## 2015-05-24 VITALS — BP 138/66 | HR 101 | Ht 66.0 in | Wt 250.0 lb

## 2015-05-24 DIAGNOSIS — E7800 Pure hypercholesterolemia, unspecified: Secondary | ICD-10-CM

## 2015-05-24 DIAGNOSIS — E119 Type 2 diabetes mellitus without complications: Secondary | ICD-10-CM | POA: Diagnosis not present

## 2015-05-24 LAB — POCT GLYCOSYLATED HEMOGLOBIN (HGB A1C): Hemoglobin A1C: 7.1

## 2015-05-24 NOTE — Progress Notes (Signed)
   Subjective:    Patient ID: [name], female    DOB: 1934-03-03, 81 y.o.   MRN: [id number]  HPI Diabetes - no hypoglycemic events. No wounds or sores that are not healing well. No increased thirst or urination. Not really checking glucose at home. Taking medications as prescribed without any side effects.  She is still struggling with constipation.  Says she's also not been exercising regularly.  Hyperlipidemia-tolerate statin well without any side effects or myalgias.  She is scheduled for a colonoscopy.    Review of Systems     Objective:   Physical Exam  Constitutional: She is oriented to person, place, and time. She appears well-developed and well-nourished.  HENT:  Head: Normocephalic and atraumatic.  Cardiovascular: Normal rate, regular rhythm and normal heart sounds.   Pulmonary/Chest: Effort normal and breath sounds normal.  Neurological: She is alert and oriented to person, place, and time.  Skin: Skin is warm and dry.  Psychiatric: She has a normal mood and affect. Her behavior is normal.        Assessment & Plan:  DM- A1c still mildly elevated at 7.1. Not maximally controlled. We'll encourage her to get back on track with diet and exercise which she admits she hasn't been doing the best. Says she really wants to work on that first.. Continue current regimen. She is on a statin. No indication for ACE inhibitor. Due for repeat CMP and lipid panel. Need to get updated eye exam report. Reminded to get flu shot this fall. Follow-up in 3 months.  Hyperlipidemia-due to recheck lipid panel.

## 2015-05-24 NOTE — Patient Instructions (Signed)
Work on diet and exercise to lower blood glucose level.

## 2015-05-26 ENCOUNTER — Ambulatory Visit: Payer: Medicare Other | Admitting: Family Medicine

## 2015-05-26 LAB — COMPLETE METABOLIC PANEL WITH GFR
ALT: 19 U/L (ref 6–29)
AST: 21 U/L (ref 10–35)
Albumin: 4.2 g/dL (ref 3.6–5.1)
Alkaline Phosphatase: 79 U/L (ref 33–130)
BUN: 10 mg/dL (ref 7–25)
CALCIUM: 9.1 mg/dL (ref 8.6–10.4)
CHLORIDE: 104 mmol/L (ref 98–110)
CO2: 30 mmol/L (ref 20–31)
Creat: 0.68 mg/dL (ref 0.60–0.93)
GFR, Est African American: >89 mL/min (ref >=60)
GFR, Est Non African American: 84 mL/min (ref >=60)
Glucose, Bld: 142 mg/dL — ABNORMAL HIGH (ref 65–99)
Potassium: 4.9 mmol/L (ref 3.5–5.3)
Sodium: 143 mmol/L (ref 135–146)
TOTAL PROTEIN: 6.6 g/dL (ref 6.1–8.1)
Total Bilirubin: 0.4 mg/dL (ref 0.2–1.2)

## 2015-05-26 LAB — LIPID PANEL
CHOL/HDL RATIO: 2.4 ratio (ref <=5.0)
CHOLESTEROL: 110 mg/dL — AB (ref 125–200)
HDL: 45 mg/dL — ABNORMAL LOW (ref >=46)
LDL CALC: 42 mg/dL (ref <130)
TRIGLYCERIDES: 116 mg/dL (ref <150)
VLDL: 23 mg/dL (ref <30)

## 2015-06-07 ENCOUNTER — Telehealth: Payer: Self-pay | Admitting: Family Medicine

## 2015-06-07 ENCOUNTER — Telehealth: Payer: Self-pay

## 2015-06-07 NOTE — Telephone Encounter (Signed)
Attempted to contact Pt regarding compliance with Crestor 20mg  tablets per [facility] we received via fax. Left message to return clinic call, provided callback information.

## 2015-06-07 NOTE — Telephone Encounter (Signed)
Opened in error

## 2015-06-07 NOTE — Telephone Encounter (Signed)
Diana Parsons does have some trouble ordering her medications on time. I suggested she switch to 90 day prescriptions instead of 30 day. She will coordinate this with her home health nurse.

## 2015-08-24 ENCOUNTER — Encounter: Payer: Self-pay | Admitting: Family Medicine

## 2015-08-24 ENCOUNTER — Ambulatory Visit (INDEPENDENT_AMBULATORY_CARE_PROVIDER_SITE_OTHER): Payer: Medicare Other | Admitting: Family Medicine

## 2015-08-24 VITALS — BP 139/61 | HR 84 | Wt 250.0 lb

## 2015-08-24 DIAGNOSIS — R635 Abnormal weight gain: Secondary | ICD-10-CM

## 2015-08-24 DIAGNOSIS — E119 Type 2 diabetes mellitus without complications: Secondary | ICD-10-CM

## 2015-08-24 DIAGNOSIS — E785 Hyperlipidemia, unspecified: Secondary | ICD-10-CM | POA: Diagnosis not present

## 2015-08-24 DIAGNOSIS — Z23 Encounter for immunization: Secondary | ICD-10-CM | POA: Diagnosis not present

## 2015-08-24 LAB — POCT GLYCOSYLATED HEMOGLOBIN (HGB A1C): HEMOGLOBIN A1C: 6.4

## 2015-08-24 LAB — POCT UA - MICROALBUMIN
CREATININE, POC: 50 mg/dL
MICROALBUMIN (UR) POC: 30 mg/L

## 2015-08-24 MED ORDER — ROSUVASTATIN CALCIUM 20 MG PO TABS: 20.0000 mg | ORAL_TABLET | Freq: Every day | ORAL | Status: DC

## 2015-08-24 NOTE — Progress Notes (Signed)
   Subjective:    Patient ID: Diana Parsons, female    DOB: [birth year], 81 y.o.   MRN: [id number]  HPI Diabetes - no hypoglycemic events. No wounds or sores that are not healing well. No increased thirst or urination. Checking glucose at home. Taking medications as prescribed without any side effects. Last A1C was 7.1.     BMI 40-she's not exercising regularly. She does feel like she's done a better job in food choices and portion control.   hyperlipidemia-tolerating her statin well. Her Crestor switched recently changed to generic which has been much more affordable for her.    She says she also stopped taking her vitamin D. The nurse from her Medicare program came out to the house and encouraged her to consider getting her vitamin D level checked before she takes an extra supplement. She was taking 1000 international units daily.she getsvery little sun exposure.  Review of Systems     Objective:   Physical Exam  Constitutional: She is oriented to person, place, and time. She appears well-developed and well-nourished.  HENT:  Head: Normocephalic and atraumatic.  Neck: Neck supple. No thyromegaly present.  Cardiovascular: Normal rate, regular rhythm and normal heart sounds.   Pulmonary/Chest: Effort normal and breath sounds normal.  Lymphadenopathy:    She has no cervical adenopathy.  Neurological: She is alert and oriented to person, place, and time.  Skin: Skin is warm and dry.  Psychiatric: She has a normal mood and affect. Her behavior is normal.          Assessment & Plan:  DM - well controlled.  Follow up in 3-4 months. She has an eye exam has been scheduled.    Abnormal weight gain/BMI 40 - discussed getting back or track for idet and exercise.    hyperlipidemia-continue current regimen. We recently checked her lipids. Next  We will check for vitamin D deficiency at her next appointment in 3 months when we are going to do her regular blood work. I think if she's  taking 1000 international units daily especially with very little sun exposure as she is perfectly safe to do that.I did warn not to take higher doses especially without a known deficiency.

## 2015-08-25 ENCOUNTER — Encounter: Payer: Self-pay | Admitting: Family Medicine

## 2015-09-28 ENCOUNTER — Other Ambulatory Visit: Payer: Self-pay | Admitting: Family Medicine

## 2015-09-30 ENCOUNTER — Other Ambulatory Visit: Payer: Self-pay

## 2015-09-30 DIAGNOSIS — E119 Type 2 diabetes mellitus without complications: Secondary | ICD-10-CM

## 2015-09-30 MED ORDER — GLUCOSE BLOOD VI STRP
ORAL_STRIP | Status: DC
Start: 2015-09-30 — End: 2016-11-04

## 2015-09-30 MED ORDER — ACCU-CHEK MULTICLIX LANCETS MISC: Status: DC

## 2015-09-30 MED ORDER — METFORMIN HCL 1000 MG PO TABS: 1000.0000 mg | ORAL_TABLET | Freq: Two times a day (BID) | ORAL | Status: DC

## 2015-10-11 LAB — HM DIABETES EYE EXAM

## 2015-11-16 ENCOUNTER — Encounter: Payer: Self-pay | Admitting: Family Medicine

## 2015-11-24 ENCOUNTER — Ambulatory Visit (INDEPENDENT_AMBULATORY_CARE_PROVIDER_SITE_OTHER): Payer: Medicare Other | Admitting: Family Medicine

## 2015-11-24 ENCOUNTER — Encounter: Payer: Self-pay | Admitting: Family Medicine

## 2015-11-24 VITALS — BP 133/62 | HR 85 | Ht 66.0 in | Wt 250.0 lb

## 2015-11-24 DIAGNOSIS — E669 Obesity, unspecified: Secondary | ICD-10-CM | POA: Diagnosis not present

## 2015-11-24 DIAGNOSIS — E119 Type 2 diabetes mellitus without complications: Secondary | ICD-10-CM

## 2015-11-24 DIAGNOSIS — E1169 Type 2 diabetes mellitus with other specified complication: Secondary | ICD-10-CM

## 2015-11-24 DIAGNOSIS — N951 Menopausal and female climacteric states: Secondary | ICD-10-CM

## 2015-11-24 LAB — BASIC METABOLIC PANEL
BUN: 13 mg/dL (ref 7–25)
CALCIUM: 9.5 mg/dL (ref 8.6–10.4)
CO2: 26 mmol/L (ref 20–31)
CREATININE: 0.69 mg/dL (ref 0.60–0.93)
Chloride: 101 mmol/L (ref 98–110)
GLUCOSE: 120 mg/dL — AB (ref 65–99)
Potassium: 4.7 mmol/L (ref 3.5–5.3)
SODIUM: 137 mmol/L (ref 135–146)

## 2015-11-24 LAB — POCT GLYCOSYLATED HEMOGLOBIN (HGB A1C): Hemoglobin A1C: 7.4

## 2015-11-24 NOTE — Progress Notes (Signed)
   Subjective:    Patient ID: Diana Parsons, female    DOB: 1934-02-03, 82 y.o.   MRN: [id number]  HPI Diabetes - no hypoglycemic events. No wounds or sores that are not healing well. No increased thirst or urination. Checking glucose at home. Taking medications as prescribed without any side effects.  Admits she has been very sedentary this winter    Would like to have her vit d checked.      Review of Systems     Objective:   Physical Exam  Constitutional: She is oriented to person, place, and time. She appears well-developed and well-nourished.  HENT:  Head: Normocephalic and atraumatic.  Cardiovascular: Normal rate, regular rhythm and normal heart sounds.   Pulmonary/Chest: Effort normal and breath sounds normal.  Neurological: She is alert and oriented to person, place, and time.  Skin: Skin is warm and dry.  Psychiatric: She has a normal mood and affect. Her behavior is normal.          Assessment & Plan:  DM -  Not well controlled. A1C if 7.4 today.  Up from last time.  Foot exam performed today.  We discussed options adjusting her medication versus working on diet and activity level. She prefer to do the later.  F/U in 3 month.    Post menopausal status/Vit D def  - due to recheck vit d level.   Obesity/BMI 40 - work on diet and exercise. We discussed strategies to increase activity level.

## 2015-11-25 LAB — VITAMIN D 25 HYDROXY (VIT D DEFICIENCY, FRACTURES): Vit D, 25-Hydroxy: 22 ng/mL — ABNORMAL LOW (ref 30–100)

## 2016-02-17 ENCOUNTER — Ambulatory Visit: Payer: Medicare Other | Admitting: Family Medicine

## 2016-03-30 ENCOUNTER — Other Ambulatory Visit: Payer: Self-pay | Admitting: Physician Assistant

## 2016-03-30 ENCOUNTER — Telehealth: Payer: Self-pay

## 2016-03-30 MED ORDER — METFORMIN HCL 1000 MG PO TABS: 1000.0000 mg | ORAL_TABLET | Freq: Two times a day (BID) | ORAL | Status: DC

## 2016-03-30 NOTE — Telephone Encounter (Signed)
Sent refill for one month.

## 2016-04-20 ENCOUNTER — Encounter: Payer: Self-pay | Admitting: Family Medicine

## 2016-04-20 ENCOUNTER — Ambulatory Visit (INDEPENDENT_AMBULATORY_CARE_PROVIDER_SITE_OTHER): Payer: Medicare Other | Admitting: Family Medicine

## 2016-04-20 VITALS — BP 144/56 | HR 85 | Wt 237.0 lb

## 2016-04-20 DIAGNOSIS — E7800 Pure hypercholesterolemia, unspecified: Secondary | ICD-10-CM

## 2016-04-20 DIAGNOSIS — E785 Hyperlipidemia, unspecified: Secondary | ICD-10-CM

## 2016-04-20 DIAGNOSIS — F4321 Adjustment disorder with depressed mood: Secondary | ICD-10-CM | POA: Diagnosis not present

## 2016-04-20 DIAGNOSIS — E119 Type 2 diabetes mellitus without complications: Secondary | ICD-10-CM

## 2016-04-20 DIAGNOSIS — R059 Cough, unspecified: Secondary | ICD-10-CM

## 2016-04-20 DIAGNOSIS — R05 Cough: Secondary | ICD-10-CM

## 2016-04-20 LAB — POCT GLYCOSYLATED HEMOGLOBIN (HGB A1C): Hemoglobin A1C: 7.2

## 2016-04-20 MED ORDER — METFORMIN HCL 1000 MG PO TABS: 1000.0000 mg | ORAL_TABLET | Freq: Two times a day (BID) | ORAL | Status: DC

## 2016-04-20 MED ORDER — SITAGLIPTIN PHOSPHATE 50 MG PO TABS: 50.0000 mg | ORAL_TABLET | Freq: Every day | ORAL | Status: DC

## 2016-04-20 MED ORDER — ROSUVASTATIN CALCIUM 20 MG PO TABS: 20.0000 mg | ORAL_TABLET | Freq: Every day | ORAL | Status: DC

## 2016-04-20 NOTE — Progress Notes (Addendum)
Subjective:    CC: DM  HPI: Diabetes - no hypoglycemic events. No wounds or sores that are not healing well. No increased thirst or urination. Checking glucose at home. Taking medications as prescribed without any side effects.  Hyperlipidemia-currently on Crestor and doing well.She denies any side effects or myalgias.  She says she actually missed her appointment and may because her brother was diagnosed with stage IV lung cancer and she flew to [place] to be with him as he passed away on hospice.  She also complains of cough today that she's had for little over a week. She just flew back from [place] about 3 weeks ago. She denies any fevers chills or sweats. She says the cough feels like it's productive but she has not actually seen any sputum. No wheezing. No sinus symptoms. She has had a little bit of a runny nose and some mild scratchy sore throat. She's been using Chloraseptic Spray and NyQuil.  Past medical history, Surgical history, Family history not pertinant except as noted below, Social history, Allergies, and medications have been entered into the medical record, reviewed, and corrections made.   Review of Systems: No fevers, chills, night sweats, weight loss, chest pain, or shortness of breath.   Objective:    General: Well Developed, well nourished, and in no acute distress.  Neuro: Alert and oriented x3, extra-ocular muscles intact, sensation grossly intact.  HEENT: Normocephalic, atraumatic , Oropharynx is clear. TMs and canals are clear. No significant cervical lymphadenopathy. No thyromegaly. Skin: Warm and dry, no rashes. Cardiac: Regular rate and rhythm, no murmurs rubs or gallops, no lower extremity edema.  Respiratory: Clear to auscultation bilaterally. Not using accessory muscles, speaking in full sentences.   Impression and Recommendations:   DM- A1C of 7.2 today, down from previous.Continue work on diet and exercise. Will add Januvia. Follow-up in 3 months.  She has lost some weight as well.   Hyperlipidemia- continue current regimen.  Due for repeat lipids next month. Lab slip provided today. Lab Results  Component Value Date   CHOL 110* 05/24/2015   HDL 45* 05/24/2015   LDLCALC 42 05/24/2015   LDLDIRECT 104* 09/14/2011   TRIG 116 05/24/2015   CHOLHDL 2.4 05/24/2015     [place] - gave reassurance. Recommended local hospice for free grief counseling if she feels she needs it.  Cough/Upper respiratory infection-cough-most consistent with viral infection. Okay to use over-the-counter cough cold medications. If not better after the weekend or if she feels like she suddenly getting worse or develops a fever or increased shortness of breath and please give [place] a call. Lung exam is completely normal today.

## 2016-06-21 LAB — COMPLETE METABOLIC PANEL WITH GFR
ALK PHOS: 62 U/L (ref 33–130)
ALT: 19 U/L (ref 6–29)
AST: 28 U/L (ref 10–35)
Albumin: 3.8 g/dL (ref 3.6–5.1)
BUN: 11 mg/dL (ref 7–25)
CALCIUM: 9 mg/dL (ref 8.6–10.4)
CHLORIDE: 106 mmol/L (ref 98–110)
CO2: 29 mmol/L (ref 20–31)
CREATININE: 0.65 mg/dL (ref 0.60–0.93)
GFR, Est Non African American: 85 mL/min (ref >=60)
Glucose, Bld: 110 mg/dL — ABNORMAL HIGH (ref 65–99)
Potassium: 4.6 mmol/L (ref 3.5–5.3)
Sodium: 141 mmol/L (ref 135–146)
Total Bilirubin: 0.4 mg/dL (ref 0.2–1.2)
Total Protein: 6.4 g/dL (ref 6.1–8.1)

## 2016-06-21 LAB — LIPID PANEL
CHOLESTEROL: 108 mg/dL — AB (ref 125–200)
HDL: 47 mg/dL (ref >=46)
LDL Cholesterol: 37 mg/dL (ref <130)
TRIGLYCERIDES: 120 mg/dL (ref <150)
Total CHOL/HDL Ratio: 2.3 Ratio (ref <=5.0)
VLDL: 24 mg/dL (ref <30)

## 2016-06-21 NOTE — Progress Notes (Signed)
All labs are normal. 

## 2016-07-20 ENCOUNTER — Encounter: Payer: Self-pay | Admitting: Family Medicine

## 2016-07-20 ENCOUNTER — Ambulatory Visit (INDEPENDENT_AMBULATORY_CARE_PROVIDER_SITE_OTHER): Payer: Medicare Other | Admitting: Family Medicine

## 2016-07-20 VITALS — BP 140/62 | HR 81 | Ht 66.0 in | Wt 242.0 lb

## 2016-07-20 DIAGNOSIS — R030 Elevated blood-pressure reading, without diagnosis of hypertension: Secondary | ICD-10-CM

## 2016-07-20 DIAGNOSIS — Z23 Encounter for immunization: Secondary | ICD-10-CM

## 2016-07-20 DIAGNOSIS — E1129 Type 2 diabetes mellitus with other diabetic kidney complication: Secondary | ICD-10-CM | POA: Diagnosis not present

## 2016-07-20 DIAGNOSIS — E119 Type 2 diabetes mellitus without complications: Secondary | ICD-10-CM | POA: Diagnosis not present

## 2016-07-20 DIAGNOSIS — R809 Proteinuria, unspecified: Secondary | ICD-10-CM

## 2016-07-20 DIAGNOSIS — E559 Vitamin D deficiency, unspecified: Secondary | ICD-10-CM

## 2016-07-20 LAB — POCT UA - MICROALBUMIN
Creatinine, POC: 50 mg/dL
Microalbumin Ur, POC: 30 mg/L

## 2016-07-20 LAB — POCT GLYCOSYLATED HEMOGLOBIN (HGB A1C): HEMOGLOBIN A1C: 6.8

## 2016-07-20 MED ORDER — SITAGLIP PHOS-METFORMIN HCL ER 100-1000 MG PO TB24: 1.0000 | ORAL_TABLET | ORAL | 1 refills | Status: DC

## 2016-07-20 MED ORDER — LISINOPRIL 5 MG PO TABS: 5.0000 mg | ORAL_TABLET | Freq: Every day | ORAL | 1 refills | Status: DC

## 2016-07-20 NOTE — Addendum Note (Signed)
Addended by: [name] D on: 07/20/2016 04:43 PM   Modules accepted: Orders

## 2016-07-20 NOTE — Progress Notes (Signed)
Subjective:    CC: DM  HPI:  Diabetes - no hypoglycemic events. No wounds or sores that are not healing well. No increased thirst or urination. Checking glucose at home. Taking medications as prescribed without any side effects.  pt reports that the [place] is is working well and would like to continue with a combination of januvia and metformin.  Vitamin D deficiency-she is continuing to take her supplement.  He has had some positive microalbumin on and off in the past. Some normal and somewhat been borderline. We are due to recheck her again today. She's not currently on an ACE inhibitor.  Past medical history, Surgical history, Family history not pertinant except as noted below, Social history, Allergies, and medications have been entered into the medical record, reviewed, and corrections made.   Review of Systems: No fevers, chills, night sweats, weight loss, chest pain, or shortness of breath.   Objective:    General: Well Developed, well nourished, and in no acute distress.  Neuro: Alert and oriented x3, extra-ocular muscles intact, sensation grossly intact.  HEENT: Normocephalic, atraumatic  Skin: Warm and dry, no rashes. Cardiac: Regular rate and rhythm, no murmurs rubs or gallops, no lower extremity edema.  Respiratory: Clear to auscultation bilaterally. Not using accessory muscles, speaking in full sentences.   Impression and Recommendations:    DM- Improved. Hemoglobin A1c down to 6.8 which is fantastic. We'll combine the Januvia and metformin together to simplify things. Continue aspirin, statin. Follow-up in 3 months.  Vitamin D deficiency-due to recheck vitamin D levels when I see her back in February.  Elevated blood pressure today-we'll keep an eye on this. It did come down a little bit only rechecked it.  Microalbuminuria-we'll call patient with results and strongly recommend that she consider starting an ACE inhibitor. We did discuss this in the room including  potential side effects of the medication.

## 2016-08-15 ENCOUNTER — Ambulatory Visit (INDEPENDENT_AMBULATORY_CARE_PROVIDER_SITE_OTHER): Payer: Medicare Other | Admitting: Family Medicine

## 2016-08-15 ENCOUNTER — Encounter: Payer: Self-pay | Admitting: Family Medicine

## 2016-08-15 VITALS — BP 118/62 | HR 92 | Wt 244.0 lb

## 2016-08-15 DIAGNOSIS — E119 Type 2 diabetes mellitus without complications: Secondary | ICD-10-CM | POA: Diagnosis not present

## 2016-08-15 DIAGNOSIS — R809 Proteinuria, unspecified: Secondary | ICD-10-CM | POA: Diagnosis not present

## 2016-08-15 DIAGNOSIS — E1129 Type 2 diabetes mellitus with other diabetic kidney complication: Secondary | ICD-10-CM

## 2016-08-15 LAB — BASIC METABOLIC PANEL WITH GFR
BUN: 12 mg/dL (ref 7–25)
CHLORIDE: 105 mmol/L (ref 98–110)
CO2: 27 mmol/L (ref 20–31)
CREATININE: 0.67 mg/dL (ref 0.60–0.93)
Calcium: 9.3 mg/dL (ref 8.6–10.4)
GFR, Est African American: >89 mL/min (ref >=60)
GFR, Est Non African American: 84 mL/min (ref >=60)
Glucose, Bld: 102 mg/dL — ABNORMAL HIGH (ref 65–99)
Potassium: 4.3 mmol/L (ref 3.5–5.3)
Sodium: 140 mmol/L (ref 135–146)

## 2016-08-15 NOTE — Patient Instructions (Signed)
The Janumet is extended release and is once a day.

## 2016-08-15 NOTE — Progress Notes (Signed)
Call patient: Your Pap smear is normal. Repeat in 2-3 years.

## 2016-08-15 NOTE — Progress Notes (Signed)
Subjective:    CC:   HPI:  Diabetes-she's doing well on the Janumet XR. We switched her from the plain metformin over to the combination. She says she just started about a week ago but so far is doing well. She did have a couple mornings were her blood sugars were elevated.  Microalbuminuria-her last urine micral albumin was positive so we decided to put her on an ACE inhibitor. She is doing well on it without any problems or side effects.  Past medical history, Surgical history, Family history not pertinant except as noted below, Social history, Allergies, and medications have been entered into the medical record, reviewed, and corrections made.   Review of Systems: No fevers, chills, night sweats, weight loss, chest pain, or shortness of breath.   Objective:    General: Well Developed, well nourished, and in no acute distress.  Neuro: Alert and oriented x3, extra-ocular muscles intact, sensation grossly intact.  HEENT: Normocephalic, atraumatic  Skin: Warm and dry, no rashes. Cardiac: Regular rate and rhythm, no murmurs rubs or gallops, no lower extremity edema.  Respiratory: Clear to auscultation bilaterally. Not using accessory muscles, speaking in full sentences.   Impression and Recommendations:    Diabetes-doing well on combination Janumet XR. F/U in 64months.    Microhematuria-doing well on lisinopril. Check BMP today.

## 2016-10-19 ENCOUNTER — Other Ambulatory Visit: Payer: Self-pay | Admitting: Family Medicine

## 2016-10-19 DIAGNOSIS — E785 Hyperlipidemia, unspecified: Secondary | ICD-10-CM

## 2016-10-22 ENCOUNTER — Encounter: Payer: Self-pay | Admitting: Family Medicine

## 2016-10-22 ENCOUNTER — Ambulatory Visit (INDEPENDENT_AMBULATORY_CARE_PROVIDER_SITE_OTHER): Payer: Medicare Other | Admitting: Family Medicine

## 2016-10-22 VITALS — BP 162/51 | HR 88 | Ht 66.0 in | Wt 249.0 lb

## 2016-10-22 DIAGNOSIS — R809 Proteinuria, unspecified: Secondary | ICD-10-CM

## 2016-10-22 DIAGNOSIS — E1129 Type 2 diabetes mellitus with other diabetic kidney complication: Secondary | ICD-10-CM | POA: Diagnosis not present

## 2016-10-22 DIAGNOSIS — E119 Type 2 diabetes mellitus without complications: Secondary | ICD-10-CM | POA: Diagnosis not present

## 2016-10-22 LAB — POCT UA - MICROALBUMIN
Creatinine, POC: 100 mg/dL
Microalbumin Ur, POC: 80 mg/L

## 2016-10-22 LAB — POCT GLYCOSYLATED HEMOGLOBIN (HGB A1C): HEMOGLOBIN A1C: 6.7

## 2016-10-22 MED ORDER — SITAGLIPTIN PHOS-METFORMIN HCL 50-1000 MG PO TABS: 1.0000 | ORAL_TABLET | Freq: Two times a day (BID) | ORAL | 1 refills | Status: DC

## 2016-10-22 NOTE — Progress Notes (Signed)
Subjective:    CC: DM  HPI: Diabetes - no hypoglycemic events. No wounds or sores that are not healing well. No increased thirst or urination. Checking glucose at home. Taking medications as prescribed without any side effects.  Her weight is up 5 lbs. She says she feels like the metformin was working better when she was taking it twice a day. She admits she hasn't been doing the best with her diet recently over the holidays.   Obesity/BMI of 40-she's not been exercising recently because of the cold it's been hard for her to go outside and she admits that around the holidays she's been eating more and not being the best she could with her diet.  Past medical history, Surgical history, Family history not pertinant except as noted below, Social history, Allergies, and medications have been entered into the medical record, reviewed, and corrections made.   Review of Systems: No fevers, chills, night sweats, weight loss, chest pain, or shortness of breath.   Objective:    General: Well Developed, well nourished, and in no acute distress.  Neuro: Alert and oriented x3, extra-ocular muscles intact, sensation grossly intact.  HEENT: Normocephalic, atraumatic  Skin: Warm and dry, no rashes. Cardiac: Regular rate and rhythm, no murmurs rubs or gallops, no lower extremity edema.  Respiratory: Clear to auscultation bilaterally. Not using accessory muscles, speaking in full sentences.   Impression and Recommendations:   DM - Well controlled. Hemoglobin A1c of 6.7.  which is absolutely fantastic. She is on an ACE inhibitor, aspirin and a statin. Continue current regimen. Follow-up in 4 months.  Encouraged her to get her eye exam.  Will change to Janumet back to twice a day.  Lab Results  Component Value Date   HGBA1C 6.8 07/20/2016   Obesity/BMI 40-encourage her to work on exercises with in the home and doing some stretching and chair exercises. Also encouraged her to get back on track with her  diet. She says she'll be starting back at work at the end of the month and that'll help with her diet because she doesn't tend to snack as much when she's working.

## 2016-11-04 ENCOUNTER — Other Ambulatory Visit: Payer: Self-pay | Admitting: Family Medicine

## 2016-11-04 DIAGNOSIS — E119 Type 2 diabetes mellitus without complications: Secondary | ICD-10-CM

## 2017-02-19 ENCOUNTER — Ambulatory Visit: Payer: Medicare Other | Admitting: Family Medicine

## 2017-02-20 ENCOUNTER — Ambulatory Visit (INDEPENDENT_AMBULATORY_CARE_PROVIDER_SITE_OTHER): Payer: Medicare Other | Admitting: Family Medicine

## 2017-02-20 ENCOUNTER — Encounter: Payer: Self-pay | Admitting: Family Medicine

## 2017-02-20 VITALS — BP 138/82 | HR 93 | Ht 66.0 in | Wt 249.0 lb

## 2017-02-20 DIAGNOSIS — M170 Bilateral primary osteoarthritis of knee: Secondary | ICD-10-CM

## 2017-02-20 DIAGNOSIS — Z6841 Body Mass Index (BMI) 40.0 and over, adult: Secondary | ICD-10-CM

## 2017-02-20 DIAGNOSIS — E7800 Pure hypercholesterolemia, unspecified: Secondary | ICD-10-CM | POA: Diagnosis not present

## 2017-02-20 DIAGNOSIS — R809 Proteinuria, unspecified: Secondary | ICD-10-CM | POA: Diagnosis not present

## 2017-02-20 DIAGNOSIS — E1129 Type 2 diabetes mellitus with other diabetic kidney complication: Secondary | ICD-10-CM | POA: Diagnosis not present

## 2017-02-20 DIAGNOSIS — E119 Type 2 diabetes mellitus without complications: Secondary | ICD-10-CM | POA: Diagnosis not present

## 2017-02-20 DIAGNOSIS — Z23 Encounter for immunization: Secondary | ICD-10-CM | POA: Diagnosis not present

## 2017-02-20 LAB — POCT GLYCOSYLATED HEMOGLOBIN (HGB A1C): HEMOGLOBIN A1C: 6.8

## 2017-02-20 NOTE — Patient Instructions (Signed)
Please schedule your yearly eye exam.

## 2017-02-20 NOTE — Progress Notes (Signed)
Pt given Shingrix vaccination today. She tolerated well. Given in LD. Pt advised to RTC in 2 months for 2nd dose.Audelia Hives [place]

## 2017-02-20 NOTE — Progress Notes (Signed)
Subjective:    CC: DM   HPI:  Diabetes - no hypoglycemic events. No wounds or sores that are not healing well. No increased thirst or urination. Checking glucose at home. Taking medications as prescribed without any side effects.  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 She says it gets her out of the house a couple days a week. She does not get out and exercise but says she does try to walk around her home to increase her activity level. He hasn't been exercising a lot because she says her knees caused pain. She said about a year ago when she was in [place] visiting family she twisted her right knee and had a steroid shot. She says it's done great for well over year until more recently when they both have been causing more soreness and aching. She's also had viscous supplementation done in the past.  Her pharmacy had mentioned the new shingles vaccine. She actually had the old one and wanted to know she should get the new one as well.  Hyperlipidemia-tolerate statin well without any side effects or problems. Last lipid panel was in September.  Past medical history, Surgical history, Family history not pertinant except as noted below, Social history, Allergies, and medications have been entered into the medical record, reviewed, and corrections made.   Review of Systems: No fevers, chills, night sweats, weight loss, chest pain, or shortness of breath.   Objective:    General: Well Developed, well nourished, and in no acute distress.  Neuro: Alert and oriented x3, extra-ocular muscles intact, sensation grossly intact.  HEENT: Normocephalic, atraumatic  Skin: Warm and dry, no rashes. Cardiac: Regular rate and rhythm, no murmurs rubs or gallops, no lower extremity edema.  Respiratory: Clear to auscultation bilaterally. Not using accessory muscles, speaking in full sentences.   Impression and Recommendations:     DM- 11 A1c 6.8 today. It did  trend upwards slightly suggesting encourage her to make sure that she is on track with diet and exercise. Now that the weather is getting a little warmer encouraged her to really work on increasing her exercise levels. Reminded her once again to please schedule her eye exam as she is overdue. She is on an ACE inhibitor, statin and aspirin daily. Due for BMP today. Continue current regimen.  Discussed the new shingles vaccine called Shingrix which is currently available. Discussed risks and benefits. She wanted to get the first one done today. Follow-up in 2 months for second dosing.  hyperlipidemia-continue with current statin. Due for repeat labs in September.  Bilateral knee pain secondary to osteoporosis-encourage her to get back in with one of our sports medicine doctors for possible injection. She would also benefit from significant weight loss as her BMI is 40.Marland Kitchen

## 2017-03-01 LAB — BASIC METABOLIC PANEL WITH GFR
BUN: 12 mg/dL (ref 7–25)
CHLORIDE: 104 mmol/L (ref 98–110)
CO2: 23 mmol/L (ref 20–31)
CREATININE: 0.76 mg/dL (ref 0.60–0.88)
Calcium: 9.1 mg/dL (ref 8.6–10.4)
GFR, EST NON AFRICAN AMERICAN: 74 mL/min (ref >=60)
GFR, Est African American: 86 mL/min (ref >=60)
Glucose, Bld: 124 mg/dL — ABNORMAL HIGH (ref 65–99)
Potassium: 4.5 mmol/L (ref 3.5–5.3)
SODIUM: 141 mmol/L (ref 135–146)

## 2017-03-04 NOTE — Progress Notes (Signed)
All labs are normal. 

## 2017-04-22 ENCOUNTER — Ambulatory Visit: Payer: Medicare Other

## 2017-05-02 ENCOUNTER — Other Ambulatory Visit: Payer: Self-pay | Admitting: Family Medicine

## 2017-05-02 DIAGNOSIS — E785 Hyperlipidemia, unspecified: Secondary | ICD-10-CM

## 2017-06-03 ENCOUNTER — Other Ambulatory Visit: Payer: Self-pay

## 2017-06-03 MED ORDER — SITAGLIPTIN PHOS-METFORMIN HCL 50-1000 MG PO TABS: 1.0000 | ORAL_TABLET | Freq: Two times a day (BID) | ORAL | 1 refills | Status: DC

## 2017-06-25 ENCOUNTER — Encounter: Payer: Self-pay | Admitting: Family Medicine

## 2017-06-25 ENCOUNTER — Ambulatory Visit (INDEPENDENT_AMBULATORY_CARE_PROVIDER_SITE_OTHER): Payer: Medicare Other | Admitting: Family Medicine

## 2017-06-25 VITALS — BP 136/72 | HR 67 | Ht 66.0 in | Wt 248.0 lb

## 2017-06-25 DIAGNOSIS — E7800 Pure hypercholesterolemia, unspecified: Secondary | ICD-10-CM | POA: Diagnosis not present

## 2017-06-25 DIAGNOSIS — E119 Type 2 diabetes mellitus without complications: Secondary | ICD-10-CM | POA: Diagnosis not present

## 2017-06-25 DIAGNOSIS — Z6841 Body Mass Index (BMI) 40.0 and over, adult: Secondary | ICD-10-CM

## 2017-06-25 DIAGNOSIS — Z23 Encounter for immunization: Secondary | ICD-10-CM

## 2017-06-25 LAB — POCT GLYCOSYLATED HEMOGLOBIN (HGB A1C): HEMOGLOBIN A1C: 7.1

## 2017-06-25 NOTE — Progress Notes (Signed)
Subjective:    CC: DM   HPI:  Diabetes - no hypoglycemic events. No wounds or sores that are not healing well. No increased thirst or urination. Checking glucose at home. Taking medications as prescribed without any side effects. Reports eye exam up to date. She doesn't exercise and admist has been eating ice cream.   Hyperlipidemia - she is tolerating the Crestor well with no S.E.    Obesity/BMI 40-her weight is up about 6 pounds compared to this time last year. She hasn't been exercising regularly because of bilateral knee pain and admit she's been getting into things like ice cream. 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   Past medical history, Surgical history, Family history not pertinant except as noted below, Social history, Allergies, and medications have been entered into the medical record, reviewed, and corrections made.   Review of Systems: No fevers, chills, night sweats, weight loss, chest pain, or shortness of breath.   Objective:    General: Well Developed, well nourished, and in no acute distress.  Neuro: Alert and oriented x3, extra-ocular muscles intact, sensation grossly intact.  HEENT: Normocephalic, atraumatic  Skin: Warm and dry, no rashes. Cardiac: Regular rate and rhythm, no murmurs rubs or gallops, no lower extremity edema.  Respiratory: Clear to auscultation bilaterally. Not using accessory muscles, speaking in full sentences.   Impression and Recommendations:   DM- Uncontrolled. A1C went up to 7.1.  Discussed options. She wants to work on diet and exercise first.  Continue current regimen. Follow up in  3 months.  Due for labs.  On ACE and statin.    Hyperlipidemia  - due to recheck lipids.  Continue statin daily.    Shingles vaccine - first one was not covered by her insurance.  *drop off a copy of the building so that we can see if we can process that under her Medicare part D. Did encourage her to call and see if her insurance will  cover the second vaccine under her Medicare part D.  Obesity/BMI 40-did encourage her to try to exercise some. She says her insurance will not cover silver sneakers. I think she would benefit from something like stationary bike since she cannot walk regularly. Also encouraged her to cut back on sweets and carbs. F/U in 3 months.

## 2017-08-08 LAB — LIPID PANEL W/REFLEX DIRECT LDL
CHOLESTEROL: 104 mg/dL (ref <200)
HDL: 43 mg/dL — ABNORMAL LOW (ref >50)
LDL Cholesterol (Calc): 43 mg/dL (calc)
Non-HDL Cholesterol (Calc): 61 mg/dL (calc) (ref <130)
Total CHOL/HDL Ratio: 2.4 (calc) (ref <5.0)
Triglycerides: 101 mg/dL (ref <150)

## 2017-08-08 LAB — COMPLETE METABOLIC PANEL WITH GFR
AG Ratio: 1.5 (calc) (ref 1.0–2.5)
ALBUMIN MSPROF: 4.1 g/dL (ref 3.6–5.1)
ALKALINE PHOSPHATASE (APISO): 61 U/L (ref 33–130)
ALT: 21 U/L (ref 6–29)
AST: 25 U/L (ref 10–35)
BUN: 12 mg/dL (ref 7–25)
CALCIUM: 9.1 mg/dL (ref 8.6–10.4)
CO2: 27 mmol/L (ref 20–32)
CREATININE: 0.75 mg/dL (ref 0.60–0.88)
Chloride: 101 mmol/L (ref 98–110)
GFR, EST NON AFRICAN AMERICAN: 75 mL/min/{1.73_m2} (ref >=60)
GFR, Est African American: 87 mL/min/{1.73_m2} (ref >=60)
GLOBULIN: 2.7 g/dL (ref 1.9–3.7)
GLUCOSE: 139 mg/dL — AB (ref 65–99)
Potassium: 4.4 mmol/L (ref 3.5–5.3)
Sodium: 138 mmol/L (ref 135–146)
TOTAL PROTEIN: 6.8 g/dL (ref 6.1–8.1)
Total Bilirubin: 0.3 mg/dL (ref 0.2–1.2)

## 2017-08-09 ENCOUNTER — Other Ambulatory Visit: Payer: Self-pay | Admitting: *Deleted

## 2017-08-09 DIAGNOSIS — E785 Hyperlipidemia, unspecified: Secondary | ICD-10-CM

## 2017-08-09 MED ORDER — LISINOPRIL 5 MG PO TABS: 5.0000 mg | ORAL_TABLET | Freq: Every day | ORAL | 1 refills | Status: DC

## 2017-08-09 MED ORDER — ROSUVASTATIN CALCIUM 20 MG PO TABS: 20.0000 mg | ORAL_TABLET | Freq: Every day | ORAL | 1 refills | Status: DC

## 2017-08-09 NOTE — Progress Notes (Signed)
All labs are normal. 

## 2017-09-26 ENCOUNTER — Ambulatory Visit: Payer: Medicare Other | Admitting: Family Medicine

## 2017-10-17 ENCOUNTER — Ambulatory Visit (INDEPENDENT_AMBULATORY_CARE_PROVIDER_SITE_OTHER): Payer: Medicare HMO | Admitting: Family Medicine

## 2017-10-17 ENCOUNTER — Encounter: Payer: Self-pay | Admitting: Family Medicine

## 2017-10-17 VITALS — BP 136/64 | HR 85 | Ht 66.0 in | Wt 244.0 lb

## 2017-10-17 DIAGNOSIS — R809 Proteinuria, unspecified: Secondary | ICD-10-CM | POA: Diagnosis not present

## 2017-10-17 DIAGNOSIS — Z6839 Body mass index (BMI) 39.0-39.9, adult: Secondary | ICD-10-CM | POA: Diagnosis not present

## 2017-10-17 DIAGNOSIS — E119 Type 2 diabetes mellitus without complications: Secondary | ICD-10-CM | POA: Diagnosis not present

## 2017-10-17 DIAGNOSIS — E1129 Type 2 diabetes mellitus with other diabetic kidney complication: Secondary | ICD-10-CM

## 2017-10-17 LAB — POCT GLYCOSYLATED HEMOGLOBIN (HGB A1C): Hemoglobin A1C: 6.9

## 2017-10-17 MED ORDER — SITAGLIPTIN-METFORMIN HCL 50-1000 MG PO TABS
1.0000 | ORAL_TABLET | Freq: Two times a day (BID) | ORAL | 1 refills | Status: DC
Start: 2017-10-17 — End: 2017-12-09

## 2017-10-17 MED ORDER — LISINOPRIL 5 MG PO TABS
5.0000 mg | ORAL_TABLET | Freq: Every day | ORAL | 1 refills | Status: DC
Start: 2017-10-17 — End: 2018-03-29

## 2017-10-17 NOTE — Progress Notes (Signed)
Subjective:    CC: DM  HPI:  Diabetes - no hypoglycemic events. No wounds or sores that are not healing well. No increased thirst or urination. Checking glucose at home. Taking medications as prescribed without any side effects.  Is excited because her new insurance plan will actually cover silver sneakers.  She now has access to Silver sneakers with her new Medicare plan and plans to go and join today.  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  He also volunteers at church and is just trying to stay active.  Microalbuminuria-taking her ACE inhibitor without any problems or side effects.  Kidney function is normal and up-to-date.  BMI 39 - Has lost 4 lbs since last here.   Past medical history, Surgical history, Family history not pertinant except as noted below, Social history, Allergies, and medications have been entered into the medical record, reviewed, and corrections made.   Review of Systems: No fevers, chills, night sweats, weight loss, chest pain, or shortness of breath.   Objective:    General: Well Developed, well nourished, and in no acute distress.  Neuro: Alert and oriented x3, extra-ocular muscles intact, sensation grossly intact.  HEENT: Normocephalic, atraumatic  Skin: Warm and dry, no rashes. Cardiac: Regular rate and rhythm, no murmurs rubs or gallops, no lower extremity edema.  Respiratory: Clear to auscultation bilaterally. Not using accessory muscles, speaking in full sentences.   Impression and Recommendations:    DM-improved.  Hemoglobin A1c down to 6.9 which is fantastic.  Ultimately had like to go down to about 6.5.  Hopefully she can join Silver sneakers and start exercising more regularly. On ACE, statin and ASA.   Microalbuminuria -medication refilled today.  Follow-up in 3 months.  BMI 39 -plans on joining Silver sneakers.  Continue watch diet.  She feels like getting out of the house helps her not eat as  much.  Follow-up in 3 months.

## 2017-12-09 ENCOUNTER — Other Ambulatory Visit: Payer: Self-pay | Admitting: *Deleted

## 2017-12-09 DIAGNOSIS — E119 Type 2 diabetes mellitus without complications: Secondary | ICD-10-CM

## 2017-12-09 MED ORDER — SITAGLIPTIN PHOS-METFORMIN HCL 50-1000 MG PO TABS: 1.0000 | ORAL_TABLET | Freq: Two times a day (BID) | ORAL | 1 refills | Status: DC

## 2017-12-11 ENCOUNTER — Other Ambulatory Visit: Payer: Self-pay | Admitting: Family Medicine

## 2017-12-11 ENCOUNTER — Other Ambulatory Visit: Payer: Self-pay | Admitting: *Deleted

## 2017-12-11 DIAGNOSIS — E119 Type 2 diabetes mellitus without complications: Secondary | ICD-10-CM

## 2017-12-11 MED ORDER — SITAGLIPTIN PHOS-METFORMIN HCL 50-1000 MG PO TABS: 1.0000 | ORAL_TABLET | Freq: Two times a day (BID) | ORAL | 1 refills | Status: DC

## 2018-01-16 ENCOUNTER — Encounter: Payer: Self-pay | Admitting: Family Medicine

## 2018-01-16 ENCOUNTER — Ambulatory Visit (INDEPENDENT_AMBULATORY_CARE_PROVIDER_SITE_OTHER): Payer: Medicare HMO | Admitting: Family Medicine

## 2018-01-16 VITALS — BP 136/51 | HR 85 | Ht 66.0 in | Wt 243.0 lb

## 2018-01-16 DIAGNOSIS — Z6839 Body mass index (BMI) 39.0-39.9, adult: Secondary | ICD-10-CM | POA: Diagnosis not present

## 2018-01-16 DIAGNOSIS — E119 Type 2 diabetes mellitus without complications: Secondary | ICD-10-CM

## 2018-01-16 DIAGNOSIS — E785 Hyperlipidemia, unspecified: Secondary | ICD-10-CM | POA: Diagnosis not present

## 2018-01-16 LAB — POCT UA - MICROALBUMIN
Creatinine, POC: 50 mg/dL
Microalbumin Ur, POC: 10 mg/L

## 2018-01-16 LAB — POCT GLYCOSYLATED HEMOGLOBIN (HGB A1C): HEMOGLOBIN A1C: 6.9

## 2018-01-16 MED ORDER — ROSUVASTATIN CALCIUM 20 MG PO TABS: 20.0000 mg | ORAL_TABLET | Freq: Every day | ORAL | 3 refills | Status: DC

## 2018-01-16 NOTE — Progress Notes (Addendum)
Subjective:    CC: DM  HPI:  Diabetes - no hypoglycemic events. No wounds or sores that are not healing well. No increased thirst or urination. Checking glucose at home. Taking medications as prescribed without any side effects.  He does occasionally forget her second dose of Janumet.  Though she does have a pillbox at home to help remind her.  F/U BMI 39/morbid obesity -she has silver sneakers to her insurance plan and [facility]  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  She has not been exercising but says she stays active.  Her lipidemia-currently on Crestor.  Tolerating it well.  Labs are up-to-date.  Lab Results  Component Value Date   CHOL 104 08/08/2017   HDL 43 (L) 08/08/2017   LDLCALC 43 08/08/2017   LDLDIRECT 104 (H) 09/14/2011   TRIG 101 08/08/2017   CHOLHDL 2.4 08/08/2017     Past medical history, Surgical history, Family history not pertinant except as noted below, Social history, Allergies, and medications have been entered into the medical record, reviewed, and corrections made.   Review of Systems: No fevers, chills, night sweats, weight loss, chest pain, or shortness of breath.   Objective:    General: Well Developed, well nourished, and in no acute distress.  Neuro: Alert and oriented x3, extra-ocular muscles intact, sensation grossly intact.  HEENT: Normocephalic, atraumatic  Skin: Warm and dry, no rashes. Cardiac: Regular rate and rhythm, no murmurs rubs or gallops, no lower extremity edema.  Respiratory: Clear to auscultation bilaterally. Not using accessory muscles, speaking in full sentences.   Impression and Recommendations:    DM-stable.  Hemoglobin A1c 6.9 again.  I really would like to get it closer to 6.5.  She is okay with sticking with the Janumet for right now.  She was just worried about hitting her Medicare gap.  She did need it  does need refills.  Due for recheck renal function as well as urine micro albumin.  MOrbid obesity -did lose 1 more pound which is fantastic but strongly encouraged her to get into an exercise routine.  She is exercising more regularly to help lower her blood sugar in addition to helping her lose weight which she has been working on.  Hyperlipidemia-due for refill on her Crestor.

## 2018-01-17 LAB — BASIC METABOLIC PANEL WITH GFR
BUN: 11 mg/dL (ref 7–25)
CO2: 26 mmol/L (ref 20–32)
Calcium: 9.1 mg/dL (ref 8.6–10.4)
Chloride: 104 mmol/L (ref 98–110)
Creat: 0.64 mg/dL (ref 0.60–0.88)
GFR, EST AFRICAN AMERICAN: 97 mL/min/{1.73_m2} (ref >=60)
GFR, EST NON AFRICAN AMERICAN: 84 mL/min/{1.73_m2} (ref >=60)
Glucose, Bld: 128 mg/dL — ABNORMAL HIGH (ref 65–99)
POTASSIUM: 4.7 mmol/L (ref 3.5–5.3)
SODIUM: 140 mmol/L (ref 135–146)

## 2018-01-17 NOTE — Progress Notes (Signed)
All labs are normal. 

## 2018-03-29 ENCOUNTER — Other Ambulatory Visit: Payer: Self-pay | Admitting: Family Medicine

## 2018-04-01 ENCOUNTER — Other Ambulatory Visit: Payer: Self-pay

## 2018-04-01 ENCOUNTER — Other Ambulatory Visit: Payer: Self-pay | Admitting: Family Medicine

## 2018-04-01 MED ORDER — LISINOPRIL 5 MG PO TABS: 5.0000 mg | ORAL_TABLET | Freq: Every day | ORAL | 1 refills | Status: DC

## 2018-04-01 NOTE — Telephone Encounter (Signed)
[facility] called stating that servers were down yesterday and they were not able to receive RF, requesting we re-send

## 2018-04-14 ENCOUNTER — Encounter: Payer: Self-pay | Admitting: Family Medicine

## 2018-04-14 ENCOUNTER — Ambulatory Visit (INDEPENDENT_AMBULATORY_CARE_PROVIDER_SITE_OTHER): Payer: Medicare HMO | Admitting: Family Medicine

## 2018-04-14 VITALS — BP 131/55 | HR 87 | Ht 66.0 in | Wt 243.0 lb

## 2018-04-14 DIAGNOSIS — Z9181 History of falling: Secondary | ICD-10-CM | POA: Diagnosis not present

## 2018-04-14 DIAGNOSIS — R0981 Nasal congestion: Secondary | ICD-10-CM

## 2018-04-14 DIAGNOSIS — E119 Type 2 diabetes mellitus without complications: Secondary | ICD-10-CM

## 2018-04-14 LAB — POCT GLYCOSYLATED HEMOGLOBIN (HGB A1C): HEMOGLOBIN A1C: 6.8 % — AB (ref 4.0–5.6)

## 2018-04-14 NOTE — Patient Instructions (Signed)
Remind to schedule an eye exam.  Check out the balance classes at the [facility]

## 2018-04-14 NOTE — Progress Notes (Signed)
Subjective:    CC: CM, recent fall  HPI:  Diabetes - no hypoglycemic events. No wounds or sores that are not healing well. No increased thirst or urination. Checking glucose at home. Taking medications as prescribed without any side effects.  Recent fall-patient says she fell about 2 weeks ago and landed on her right knee and hip.  She has no specific complaints today but just wanted to make [place] aware.  Though she is actually had a couple of more falls in the last year per her family.  She had some shoes on when you dislodge her feet and it has a piece that wraps around the toe.  She was bending over making the edge of her bed with her foot turned she said she actually stumbled sideways first but then landed on her right knee and the right lateral hip.  Unfortunately she was not able to get up off the floor and called her daughter-in-law to come help her.  Since then she has been considering getting a life alert necklace.  She did use her cane for a few days but has not been using the last couple of days.  She says that she really has not had any significant discomfort or pain in the knee or the hip.  She did have an abrasion over the knee but no bruising and no bruising over the hip.  Is not had any recent changes to her medication and she is not taking anything particularly sedating.  She also wanted to discuss that at night when she tries to fall asleep she will start to notice a lot of drainage in the back of her throat and sinuses.  She just feels like she cannot clear it.  It always seems to happen at night and then occasionally she will even get a little tickle in her throat and has to use a cough drop.  She is not currently taking any allergy medication.  Past medical history, Surgical history, Family history not pertinant except as noted below, Social history, Allergies, and medications have been entered into the medical record, reviewed, and corrections made.   Review of Systems: No fevers,  chills, night sweats, weight loss, chest pain, or shortness of breath.   Objective:    General: Well Developed, well nourished, and in no acute distress.  Neuro: Alert and oriented x3, extra-ocular muscles intact, sensation grossly intact.  HEENT: Normocephalic, atraumatic  Skin: Warm and dry, no rashes. Cardiac: Regular rate and rhythm, no murmurs rubs or gallops, no lower extremity edema.  Respiratory: Clear to auscultation bilaterally. Not using accessory muscles, speaking in full sentences.   Impression and Recommendations:   DM- reminded to schedule her eye exam.  A1C of 6.8.  Well controlled. Continue current regimen. Follow up in  3 months.  He is really doing great continue to work on dietary changes and encouraged her to try to lose about 2 more pounds when I see her back.  Recent fall - discussed prevention including removing rugs from the house, using her [place]. No recent medication changes.  She is not on any sedating medication..  Recommend balance classes.  She is also planning on getting an alert necklace.    Nasal congestion-most bothersome at night.  Recommend a trial of nasal saline irrigation as well as running a humidifier at night.  If not improving over the next couple of weeks and consider adding a nasal steroid spray and also consider switching her lisinopril to an arm if she  continues to have the "tickle" in her throat.

## 2018-06-14 ENCOUNTER — Other Ambulatory Visit: Payer: Self-pay | Admitting: Family Medicine

## 2018-06-14 DIAGNOSIS — E119 Type 2 diabetes mellitus without complications: Secondary | ICD-10-CM

## 2018-07-07 ENCOUNTER — Telehealth: Payer: Self-pay | Admitting: *Deleted

## 2018-07-07 ENCOUNTER — Ambulatory Visit: Payer: Medicare HMO | Admitting: Family Medicine

## 2018-07-07 NOTE — Telephone Encounter (Signed)
Called pt and informed pt that she is 10 days early for her appt. Asked if she could call back to reschedule this if possible because I'm not sure if insurance will pay for her to have the A1c done. She is actually not due to be seen until 07/16/18.Elouise Munroe, [place]

## 2018-07-17 ENCOUNTER — Ambulatory Visit (INDEPENDENT_AMBULATORY_CARE_PROVIDER_SITE_OTHER): Payer: Medicare HMO | Admitting: Family Medicine

## 2018-07-17 ENCOUNTER — Encounter: Payer: Self-pay | Admitting: Family Medicine

## 2018-07-17 VITALS — BP 143/55 | HR 87 | Ht 66.0 in | Wt 245.0 lb

## 2018-07-17 DIAGNOSIS — R69 Illness, unspecified: Secondary | ICD-10-CM | POA: Diagnosis not present

## 2018-07-17 DIAGNOSIS — R030 Elevated blood-pressure reading, without diagnosis of hypertension: Secondary | ICD-10-CM | POA: Diagnosis not present

## 2018-07-17 DIAGNOSIS — E7800 Pure hypercholesterolemia, unspecified: Secondary | ICD-10-CM | POA: Diagnosis not present

## 2018-07-17 DIAGNOSIS — Z23 Encounter for immunization: Secondary | ICD-10-CM

## 2018-07-17 DIAGNOSIS — E119 Type 2 diabetes mellitus without complications: Secondary | ICD-10-CM

## 2018-07-17 DIAGNOSIS — E785 Hyperlipidemia, unspecified: Secondary | ICD-10-CM | POA: Diagnosis not present

## 2018-07-17 DIAGNOSIS — E559 Vitamin D deficiency, unspecified: Secondary | ICD-10-CM | POA: Diagnosis not present

## 2018-07-17 LAB — POCT GLYCOSYLATED HEMOGLOBIN (HGB A1C): Hemoglobin A1C: 7.1 % — AB (ref 4.0–5.6)

## 2018-07-17 MED ORDER — ACCU-CHEK MULTICLIX LANCETS MISC: 12 refills | Status: DC

## 2018-07-17 NOTE — Progress Notes (Signed)
Subjective:    CC: DM  HPI:  Diabetes - no hypoglycemic events. No wounds or sores that are not healing well. No increased thirst or urination. Checking glucose at home. Taking medications as prescribed without any side effects.  She gained a little bit of weight loss since she was last here.  When I last saw her about 3 months ago she had fallen we have discussed some fall prevention and may be been doing some balance classes at the [facility].  She says in fact she is actually been very inactive over the summer she has not been exercising and has not checked on the Silver sneakers.  Follow-up hyperlipidemia-currently on statin and tolerating well without any side effects or problems.  Vit D def -he has been taking her supplement for quite some time and is curious to see if her levels are back to normal.  Past medical history, Surgical history, Family history not pertinant except as noted below, Social history, Allergies, and medications have been entered into the medical record, reviewed, and corrections made.   Review of Systems: No fevers, chills, night sweats, weight loss, chest pain, or shortness of breath.   Objective:    General: Well Developed, well nourished, and in no acute distress.  Neuro: Alert and oriented x3, extra-ocular muscles intact, sensation grossly intact.  HEENT: Normocephalic, atraumatic  Skin: Warm and dry, no rashes. Cardiac: Regular rate and rhythm, no murmurs rubs or gallops, no lower extremity edema.  Respiratory: Clear to auscultation bilaterally. Not using accessory muscles, speaking in full sentences.   Impression and Recommendations:    DM -uncontrolled.  Heme globin A1c up to 7.1 from previous of 6.8.  She is currently on Janumet. She wants to work on exercise and diet to bring this down. Says she plans on exercising at the [facility]. She has knee problems so we discussed recumbant bike.    Hyperlipidemia-due to repeat lipid panel and check liver enzymes.  Labs  ordered.  Vitamin D deficiency-due to recheck level she has been taking her supplement regularly.  Is been on 1000 IU daily.  Elevated BP - repeat still elevated.  Pt wants to work on diet and exercise first to bring it down.

## 2018-07-17 NOTE — Patient Instructions (Signed)

## 2018-07-22 DIAGNOSIS — E559 Vitamin D deficiency, unspecified: Secondary | ICD-10-CM | POA: Diagnosis not present

## 2018-07-22 DIAGNOSIS — E119 Type 2 diabetes mellitus without complications: Secondary | ICD-10-CM | POA: Diagnosis not present

## 2018-07-22 DIAGNOSIS — E785 Hyperlipidemia, unspecified: Secondary | ICD-10-CM | POA: Diagnosis not present

## 2018-07-23 ENCOUNTER — Other Ambulatory Visit: Payer: Self-pay

## 2018-07-23 DIAGNOSIS — E119 Type 2 diabetes mellitus without complications: Secondary | ICD-10-CM

## 2018-07-23 LAB — LIPID PANEL
Cholesterol: 101 mg/dL (ref <200)
HDL: 42 mg/dL — ABNORMAL LOW (ref >50)
LDL Cholesterol (Calc): 40 mg/dL (calc)
NON-HDL CHOLESTEROL (CALC): 59 mg/dL (ref <130)
Total CHOL/HDL Ratio: 2.4 (calc) (ref <5.0)
Triglycerides: 103 mg/dL (ref <150)

## 2018-07-23 LAB — COMPLETE METABOLIC PANEL WITH GFR
AG RATIO: 1.7 (calc) (ref 1.0–2.5)
ALT: 19 U/L (ref 6–29)
AST: 26 U/L (ref 10–35)
Albumin: 4.1 g/dL (ref 3.6–5.1)
Alkaline phosphatase (APISO): 60 U/L (ref 33–130)
BILIRUBIN TOTAL: 0.4 mg/dL (ref 0.2–1.2)
BUN: 9 mg/dL (ref 7–25)
CALCIUM: 9.1 mg/dL (ref 8.6–10.4)
CO2: 28 mmol/L (ref 20–32)
Chloride: 104 mmol/L (ref 98–110)
Creat: 0.67 mg/dL (ref 0.60–0.88)
GFR, EST AFRICAN AMERICAN: 96 mL/min/{1.73_m2} (ref >=60)
GFR, EST NON AFRICAN AMERICAN: 82 mL/min/{1.73_m2} (ref >=60)
GLOBULIN: 2.4 g/dL (ref 1.9–3.7)
Glucose, Bld: 127 mg/dL — ABNORMAL HIGH (ref 65–99)
POTASSIUM: 4.5 mmol/L (ref 3.5–5.3)
SODIUM: 140 mmol/L (ref 135–146)
TOTAL PROTEIN: 6.5 g/dL (ref 6.1–8.1)

## 2018-07-23 LAB — VITAMIN D 25 HYDROXY (VIT D DEFICIENCY, FRACTURES): Vit D, 25-Hydroxy: 32 ng/mL (ref 30–100)

## 2018-07-23 MED ORDER — GLUCOSE BLOOD VI STRP: ORAL_STRIP | 99 refills | Status: DC

## 2018-07-23 MED ORDER — ACCU-CHEK FASTCLIX LANCET KIT: PACK | 0 refills | Status: DC

## 2018-07-24 ENCOUNTER — Other Ambulatory Visit: Payer: Self-pay | Admitting: Family Medicine

## 2018-07-24 DIAGNOSIS — R69 Illness, unspecified: Secondary | ICD-10-CM | POA: Diagnosis not present

## 2018-07-24 DIAGNOSIS — E119 Type 2 diabetes mellitus without complications: Secondary | ICD-10-CM

## 2018-08-19 ENCOUNTER — Other Ambulatory Visit: Payer: Self-pay | Admitting: *Deleted

## 2018-08-19 DIAGNOSIS — E119 Type 2 diabetes mellitus without complications: Secondary | ICD-10-CM

## 2018-08-19 MED ORDER — ONETOUCH ULTRASOFT LANCETS MISC: 11 refills | Status: DC

## 2018-09-23 DIAGNOSIS — R69 Illness, unspecified: Secondary | ICD-10-CM | POA: Diagnosis not present

## 2018-10-21 ENCOUNTER — Ambulatory Visit (INDEPENDENT_AMBULATORY_CARE_PROVIDER_SITE_OTHER): Payer: Medicare HMO | Admitting: Family Medicine

## 2018-10-21 ENCOUNTER — Encounter: Payer: Self-pay | Admitting: Family Medicine

## 2018-10-21 ENCOUNTER — Other Ambulatory Visit: Payer: Self-pay | Admitting: Family Medicine

## 2018-10-21 VITALS — BP 137/67 | HR 91 | Ht 66.0 in | Wt 245.0 lb

## 2018-10-21 DIAGNOSIS — E7800 Pure hypercholesterolemia, unspecified: Secondary | ICD-10-CM

## 2018-10-21 DIAGNOSIS — E119 Type 2 diabetes mellitus without complications: Secondary | ICD-10-CM

## 2018-10-21 DIAGNOSIS — R809 Proteinuria, unspecified: Secondary | ICD-10-CM | POA: Diagnosis not present

## 2018-10-21 DIAGNOSIS — R0981 Nasal congestion: Secondary | ICD-10-CM | POA: Diagnosis not present

## 2018-10-21 DIAGNOSIS — E1129 Type 2 diabetes mellitus with other diabetic kidney complication: Secondary | ICD-10-CM

## 2018-10-21 DIAGNOSIS — M79675 Pain in left toe(s): Secondary | ICD-10-CM

## 2018-10-21 DIAGNOSIS — R69 Illness, unspecified: Secondary | ICD-10-CM | POA: Diagnosis not present

## 2018-10-21 LAB — POCT GLYCOSYLATED HEMOGLOBIN (HGB A1C): HEMOGLOBIN A1C: 6.9 % — AB (ref 4.0–5.6)

## 2018-10-21 MED ORDER — ACCU-CHEK AVIVA DEVI: 0 refills | Status: DC

## 2018-10-21 MED ORDER — GLUCOSE BLOOD VI STRP: ORAL_STRIP | 11 refills | Status: DC

## 2018-10-21 MED ORDER — FLUTICASONE PROPIONATE 50 MCG/ACT NA SUSP: 1.0000 | Freq: Every day | NASAL | 6 refills | Status: DC

## 2018-10-21 MED ORDER — GLUCOSE BLOOD VI STRP: ORAL_STRIP | 12 refills | Status: DC

## 2018-10-21 NOTE — Progress Notes (Signed)
Subjective:    CC: DM  HPI:  Diabetes - no hypoglycemic events. No wounds or sores that are not healing well. No increased thirst or urination. Checking glucose at home. Taking medications as prescribed without any side effects.  Hyperlipidemia-currently taking statin and tolerating well without any side effects or myalgias.  She is still having some mild nasal congestion and post nasal drip. She has been using the nasal saline some.  Says the saline helps some.  She also complains that she is noticed a little bit of redness and irritation along the medial border of the left great toenail.  She has been putting peroxide on it.  She has not noticed any drainage and she says it really is not painful or bothersome unless she puts put direct pressure on it.  No pain with walking.  Past medical history, Surgical history, Family history not pertinant except as noted below, Social history, Allergies, and medications have been entered into the medical record, reviewed, and corrections made.   Review of Systems: No fevers, chills, night sweats, weight loss, chest pain, or shortness of breath.   Objective:    General: Well Developed, well nourished, and in no acute distress.  Neuro: Alert and oriented x3, extra-ocular muscles intact, sensation grossly intact.  HEENT: Normocephalic, atraumatic  Skin: Warm and dry, no rashes. Cardiac: Regular rate and rhythm, no murmurs rubs or gallops, no lower extremity edema.  Respiratory: Clear to auscultation bilaterally. Not using accessory muscles, speaking in full sentences. MSK: She has some erythema along the medial border of the left great nail.  Nontender with pressure no drainage or open wounds.  No sign of abscess.   Impression and Recommendations:    Diabetes --A1c improved at 6.9.  She is on a statin and ACE inhibitor for the microalbuminuria.  Recent labs are up-to-date from October.  Microalbuminuria secondary to diabetes-currently on ACE  inhibitor.  Hyperlipidemia-last LDL at 40 in October well controlled.  Continue current regimen.  Congestion/allergic rhinitis-recommend a trial of fluticasone in addition to the nasal saline.  Left great toe with redness and mild swelling-it looks consistent with an ingrown toenail.  Continue with soaks and massage and if not improving please let us know.  Discussed that she may need partial nail removal if it is not improving, suddenly gets worse, or she notices any pus or drainage from the toe.

## 2018-11-16 ENCOUNTER — Other Ambulatory Visit: Payer: Self-pay | Admitting: Family Medicine

## 2018-11-16 DIAGNOSIS — E119 Type 2 diabetes mellitus without complications: Secondary | ICD-10-CM

## 2018-12-12 ENCOUNTER — Other Ambulatory Visit: Payer: Self-pay | Admitting: Family Medicine

## 2019-01-11 ENCOUNTER — Other Ambulatory Visit: Payer: Self-pay | Admitting: Family Medicine

## 2019-01-11 DIAGNOSIS — E785 Hyperlipidemia, unspecified: Secondary | ICD-10-CM

## 2019-01-16 ENCOUNTER — Telehealth: Payer: Self-pay | Admitting: Family Medicine

## 2019-01-16 NOTE — Telephone Encounter (Signed)
FYI: Diana Parsons has a DM check  on 4/7/.

## 2019-01-16 NOTE — Telephone Encounter (Signed)
noted 

## 2019-01-19 NOTE — Telephone Encounter (Signed)
error 

## 2019-01-20 ENCOUNTER — Ambulatory Visit (INDEPENDENT_AMBULATORY_CARE_PROVIDER_SITE_OTHER): Payer: Medicare HMO | Admitting: Family Medicine

## 2019-01-20 ENCOUNTER — Encounter: Payer: Self-pay | Admitting: Family Medicine

## 2019-01-20 ENCOUNTER — Other Ambulatory Visit: Payer: Self-pay

## 2019-01-20 VITALS — BP 155/67 | HR 92 | Ht 66.0 in | Wt 245.0 lb

## 2019-01-20 DIAGNOSIS — R030 Elevated blood-pressure reading, without diagnosis of hypertension: Secondary | ICD-10-CM | POA: Diagnosis not present

## 2019-01-20 DIAGNOSIS — E119 Type 2 diabetes mellitus without complications: Secondary | ICD-10-CM | POA: Diagnosis not present

## 2019-01-20 NOTE — Progress Notes (Signed)
4/4 bp: 158/87  P:110 BS: 119 4/5 bp: 156/57 p:110  BS:  146 4/6 bp: 165/70 p:94    BS:  125  4/7: BS140    Pt reports that she takes her bp right after she gets up.Marland KitchenMarland KitchenElouise Munroe, [place]

## 2019-01-20 NOTE — Progress Notes (Signed)
Virtual Visit via Telephone Note  I connected with Diana Parsons on 01/20/19 at 10:30 AM EDT by telephone and verified that I am speaking with the correct person using two identifiers.   I discussed the limitations, risks, security and privacy concerns of performing an evaluation and management service by telephone and the availability of in person appointments. I also discussed with the patient that there may be a patient responsible charge related to this service. The patient expressed understanding and agreed to proceed.   Subjective:    CC: DM  HPI:  Diabetes - no hypoglycemic events. No wounds or sores that are not healing well. No increased thirst or urination. Checking glucose at home. Fasting sugars average 132. Taking medications as prescribed without any side effects.  Home BPs have been running high but normal the last several times she was here. Thinks her home cuff is not accurate.  No headaches.  No swelling.  No CP or SOB.    He is has been staying at home and not getting out in public with the current pandemic.  She says that she is doing fine emotionally.  She is used to being alone.  Past medical history, Surgical history, Family history not pertinant except as noted below, Social history, Allergies, and medications have been entered into the medical record, reviewed, and corrections made.   Review of Systems: No fevers, chills, night sweats, weight loss, chest pain, or shortness of breath.   Objective:    General: Speaking clearly in complete sentences without any shortness of breath.  Alert and oriented x3.  Normal judgment. No apparent acute distress.   Impression and Recommendations:   DM-  She is doing well.  Found like overall her blood sugars have been well controlled just continue to work on healthy choices and encouraged her to stay as active as possible and get out in the yard get some fresh air move around.  Now on statin and ACEi.  Will f/u in 3 months.   She is due for blood work.    Elevated BP - she will monitor. I think her home cuff is not accurate.  Encouraged her to maybe try new batteries and see if that makes a difference it does not seem to be consistent with what we normally get here in the office and I do not want to make adjustments and then end up dropping her blood pressure too much.  She is really on a low-dose ACE inhibitor more for renal protection and not really for blood pressure but will continue to monitor carefully.    I discussed the assessment and treatment plan with the patient. The patient was provided an opportunity to ask questions and all were answered. The patient agreed with the plan and demonstrated an understanding of the instructions.   The patient was advised to call back or seek an in-person evaluation if the symptoms worsen or if the condition fails to improve as anticipated.  I provided 15 minutes of non-face-to-face time during this encounter.   [name], MD

## 2019-03-16 ENCOUNTER — Ambulatory Visit (INDEPENDENT_AMBULATORY_CARE_PROVIDER_SITE_OTHER): Payer: Medicare HMO | Admitting: Physician Assistant

## 2019-03-16 VITALS — Temp 98.2°F | Ht 66.0 in | Wt 245.0 lb

## 2019-03-16 DIAGNOSIS — Z8619 Personal history of other infectious and parasitic diseases: Secondary | ICD-10-CM | POA: Diagnosis not present

## 2019-03-16 DIAGNOSIS — R21 Rash and other nonspecific skin eruption: Secondary | ICD-10-CM | POA: Diagnosis not present

## 2019-03-16 MED ORDER — VALACYCLOVIR HCL 1 G PO TABS: 1000.0000 mg | ORAL_TABLET | Freq: Three times a day (TID) | ORAL | 0 refills | Status: DC

## 2019-03-16 MED ORDER — LIDOCAINE 5 % EX OINT: 1.0000 "application " | TOPICAL_OINTMENT | Freq: Three times a day (TID) | CUTANEOUS | 0 refills | Status: DC | PRN

## 2019-03-16 NOTE — Progress Notes (Deleted)
Started last Thursday. Rash extends from middle of back, around left side neck/ear, and at the front of neck. She has had shingles before close to her eye. It is painful to touch. Itchy. She has been using cold compresses and lotion that she has refrigerated to help with pain/itching.

## 2019-03-16 NOTE — Progress Notes (Signed)
Patient ID: Diana Parsons, female   DOB: Jun 17, 1934, 85 y.o.   MRN: [id number] .Marland KitchenVirtual Visit via Telephone Note  I connected with Diana Parsons on 03/16/19 at 10:10 AM EDT by telephone and verified that I am speaking with the correct person using two identifiers.  Location: Patient: home Provider: clinic   I discussed the limitations, risks, security and privacy concerns of performing an evaluation and management service by telephone and the availability of in person appointments. I also discussed with the patient that there may be a patient responsible charge related to this service. The patient expressed understanding and agreed to proceed.   History of Present Illness: Pt is a 85 yo female who calls in for a rash that started 5 days ago, last Thursday. The rash starts at the mid line of left side of back and wraps around neck and onto left ear. She has had shingles before and the rash is very similar. She describes the rash as very painful to touch and itchy with a lot of redness and tiny bumps that are firm.The only thing that makes feel better is cool compresses. Last time she was treated for shingles was when she was in her 38's. She denies any recent illness. She has been a little more stressed with COVID-19. She has had zostavax and shingrix(one dose). Insurance did not pay for shingrix and cost too much.   .. Active Ambulatory Problems    Diagnosis Date Noted  . Diabetes mellitus without complication ([place]) 98/92/1194  . HYPERCHOLESTEROLEMIA, PURE 02/03/2007  . BREAST MASS, LEFT 02/03/2007  . POSTMENOPAUSAL STATUS 02/03/2007  . Osteoarthritis of left knee 05/28/2012  . Lumbar degenerative disc disease 05/03/2014  . Microalbuminuria due to type 2 diabetes mellitus ([place]) 07/20/2016   Resolved Ambulatory Problems    Diagnosis Date Noted  . URI 06/30/2010  . Onychia and paronychia of finger 03/25/2008  . SKIN RASH 02/07/2009  . ABNORMAL FINDINGS, ELEVATED BP W/O HTN 02/03/2007    No Additional Past Medical History       Observations/Objective: No acute distress. Not able to see rash.   .. Today's Vitals   03/16/19 0925  Temp: 98.2 F (36.8 C)  TempSrc: Oral  Weight: 245 lb (111.1 kg)  Height: 5\' 6"  (1.676 m)   Body mass index is 39.54 kg/m.    Assessment and Plan:  Marland KitchenMarland KitchenTearah was seen today for herpes zoster.  Diagnoses and all orders for this visit:  Rash -     valACYclovir (VALTREX) 1000 MG tablet; Take 1 tablet (1,000 mg total) by mouth 3 (three) times daily. For 7 days. -     lidocaine (XYLOCAINE) 5 % ointment; Apply 1 application topically 3 (three) times daily as needed.  History of shingles -     valACYclovir (VALTREX) 1000 MG tablet; Take 1 tablet (1,000 mg total) by mouth 3 (three) times daily. For 7 days. -     lidocaine (XYLOCAINE) 5 % ointment; Apply 1 application topically 3 (three) times daily as needed.   Based on hx and that patient has had a similar rash treated for shingles with valtrex and lidocaine. Consider calamine lotion OTC.  Continue to use cool compresses and tylenol as needed. Follow up if not improving or worsening or moving towards eye. Discussed contagious if blisters open up. Keep covered and wash hands.   Pt has had zostavax and one dose of shingrix. Insurance did not pay for shingrix and not interested in 2nd dose.    Follow  Up Instructions:    I discussed the assessment and treatment plan with the patient. The patient was provided an opportunity to ask questions and all were answered. The patient agreed with the plan and demonstrated an understanding of the instructions.   The patient was advised to call back or seek an in-person evaluation if the symptoms worsen or if the condition fails to improve as anticipated.  I provided 15 minutes of non-face-to-face time during this encounter.   [name], PA-C

## 2019-03-17 ENCOUNTER — Encounter: Payer: Self-pay | Admitting: Physician Assistant

## 2019-03-30 ENCOUNTER — Ambulatory Visit (INDEPENDENT_AMBULATORY_CARE_PROVIDER_SITE_OTHER): Payer: Medicare HMO | Admitting: Family Medicine

## 2019-03-30 ENCOUNTER — Encounter: Payer: Self-pay | Admitting: Family Medicine

## 2019-03-30 VITALS — BP 142/78 | HR 117 | Temp 98.4°F | Ht 66.0 in | Wt 248.0 lb

## 2019-03-30 DIAGNOSIS — R6884 Jaw pain: Secondary | ICD-10-CM | POA: Diagnosis not present

## 2019-03-30 DIAGNOSIS — H9202 Otalgia, left ear: Secondary | ICD-10-CM | POA: Diagnosis not present

## 2019-03-30 DIAGNOSIS — E119 Type 2 diabetes mellitus without complications: Secondary | ICD-10-CM | POA: Diagnosis not present

## 2019-03-30 NOTE — Patient Instructions (Signed)
Alternate 2 Ibuprofen and 2 Tylenol every 4 hours as needed.

## 2019-03-30 NOTE — Progress Notes (Signed)
Acute Office Visit  Subjective:    Patient ID: Diana Parsons, female    DOB: 01-Dec-1933, 85 y.o.   MRN: [id number]  Chief Complaint  Patient presents with  . Lymphadenopathy    HPI Patient is in today for swelling on the left side of her neck x 1 week.  + burning sensation. No fever, sweats or chills.  Her head hurts on the left side of her head.  She was seen for  A rash on 6/1. She has had it for about 5 days at that point.  She was diagnosed with likely shingles and started on Valtrex and lidocaine ointment.  Says the rash seems to be almost completely cleared but she still having pain.  Been using some lidocaine ointment and that does help.  She says when she takes Tylenol also helps but she tries not to take it very often.  History reviewed. No pertinent past medical history.  Past Surgical History:  Procedure Laterality Date  . ABDOMINAL HYSTERECTOMY  85 yrs old  . left shoulder sx s/p fall  2001   has pin in place  . lumbarp spine surgery  07/2014   Dr. Lurene Shadow    Family History  Problem Relation Age of Onset  . Cancer Mother        colon  . Diabetes Sister   . Diabetes Brother     Social History   Socioeconomic History  . Marital status: Legally Separated    Spouse name: Not on file  . Number of children: Not on file  . Years of education: Not on file  . Highest education level: Not on file  Occupational History  . Not on file  Social Needs  . Financial resource strain: Not on file  . Food insecurity    Worry: Not on file    Inability: Not on file  . Transportation needs    Medical: Not on file    Non-medical: Not on file  Tobacco Use  . Smoking status: Former Smoker    Packs/day: 0.10    Years: 3.00    Pack years: 0.30  . Smokeless tobacco: Former Network engineer and Sexual Activity  . Alcohol use: No  . Drug use: No  . Sexual activity: Not on file  Lifestyle  . Physical activity    Days per week: Not on file    Minutes per session: Not on  file  . Stress: Not on file  Relationships  . Social Herbalist on phone: Not on file    Gets together: Not on file    Attends religious service: Not on file    Active member of club or organization: Not on file    Attends meetings of clubs or organizations: Not on file    Relationship status: Not on file  . Intimate partner violence    Fear of current or ex partner: Not on file    Emotionally abused: Not on file    Physically abused: Not on file    Forced sexual activity: Not on file  Other Topics Concern  . Not on file  Social History Narrative  . Not on file    Outpatient Medications Prior to Visit  Medication Sig Dispense Refill  . aspirin 81 MG tablet Take 81 mg by mouth daily.    . Blood Glucose Monitoring Suppl (ONETOUCH VERIO) w/Device KIT USE TO TEST ONCE DAILY. DX: E11.9 1 kit PRN  . Calcium Carb-Cholecalciferol (CALCIUM 600/VITAMIN  D3 PO) Take 1 tablet by mouth daily.    . fish oil-omega-3 fatty acids 1000 MG capsule Take 1 g by mouth daily.     . fluticasone (FLONASE) 50 MCG/ACT nasal spray Place 1 spray into both nostrils daily. 16 g 6  . glucose blood test strip FOR TESTING BLOOD SUGARS ONCE DAILY. DX:E11.9 100 each 12  . JANUMET 50-1000 MG tablet TAKE 1 TABLET BY MOUTH 2 TIMES DAILY WITH A MEAL 60 tablet 5  . Lancets (ONETOUCH ULTRASOFT) lancets For use for checking blood sugars daily. Dx:E11.9 100 each 11  . lisinopril (PRINIVIL,ZESTRIL) 5 MG tablet TAKE 1 TABLET BY MOUTH EVERY DAY 90 tablet 1  . Multiple Vitamins-Minerals (OCUVITE PO) Take 1 tablet by mouth daily.    . rosuvastatin (CRESTOR) 20 MG tablet TAKE 1 TABLET BY MOUTH EVERY DAY 90 tablet 3  . lidocaine (XYLOCAINE) 5 % ointment Apply 1 application topically 3 (three) times daily as needed. 50 g 0  . valACYclovir (VALTREX) 1000 MG tablet Take 1 tablet (1,000 mg total) by mouth 3 (three) times daily. For 7 days. 21 tablet 0   No facility-administered medications prior to visit.     No Known  Allergies  ROS     Objective:    Physical Exam  Constitutional: She is oriented to person, place, and time. She appears well-developed and well-nourished.  HENT:  Head: Normocephalic and atraumatic.  Right Ear: External ear normal.  Left Ear: External ear normal.  Nose: Nose normal.  He does have some swelling over the left jaw, near the TMJ joint.  Eyes: Conjunctivae are normal.  Neck: No thyromegaly present.  Cardiovascular: Normal rate, regular rhythm and normal heart sounds.  Pulmonary/Chest: Effort normal and breath sounds normal.  Lymphadenopathy:    She has no cervical adenopathy.  Neurological: She is alert and oriented to person, place, and time.  Skin: Skin is warm and dry.  Psychiatric: She has a normal mood and affect. Her behavior is normal.    BP (!) 142/78   Pulse (!) 117   Temp 98.4 F (36.9 C)   Ht _0  (1.676 m)   Wt 248 lb (112.5 kg)   SpO2 98%   BMI 40.03 kg/m  Wt Readings from Last 3 Encounters:  03/30/19 248 lb (112.5 kg)  03/16/19 245 lb (111.1 kg)  01/20/19 245 lb (111.1 kg)    Health Maintenance Due  Topic Date Due  . FOOT EXAM  01/17/2019    There are no preventive care reminders to display for this patient.   No results found for: TSH No results found for: WBC, HGB, HCT, MCV, PLT Lab Results  Component Value Date   NA 140 07/22/2018   K 4.5 07/22/2018   CO2 28 07/22/2018   GLUCOSE 127 (H) 07/22/2018   BUN 9 07/22/2018   CREATININE 0.67 07/22/2018   BILITOT 0.4 07/22/2018   ALKPHOS 62 06/20/2016   AST 26 07/22/2018   ALT 19 07/22/2018   PROT 6.5 07/22/2018   ALBUMIN 3.8 06/20/2016   CALCIUM 9.1 07/22/2018   Lab Results  Component Value Date   CHOL 101 07/22/2018   Lab Results  Component Value Date   HDL 42 (L) 07/22/2018   Lab Results  Component Value Date   LDLCALC 40 07/22/2018   Lab Results  Component Value Date   TRIG 103 07/22/2018   Lab Results  Component Value Date   CHOLHDL 2.4 07/22/2018   Lab  Results  Component Value  Date   HGBA1C 6.9 (A) 10/21/2018       Assessment & Plan:   Problem List Items Addressed This Visit    None    Visit Diagnoses    Left ear pain    -  Primary   Jaw pain         Left ear pain-gave reassurance that ear exam is completely normal.  I really think the pain is related to the shingles itself so even though her rash is almost completely resolved.  She does still have a little bit of swelling over the base of the jaw which I suspect is probably a lymph node.  Gave her reassurance that I think it is just nerve pain.  Says the Tylenol does seem to help him get a put her on a regimen of alternating ibuprofen and Tylenol for the next week and hopefully will continue to improve.  If not please let us know.  Or she develops worsening pain let me know.  Or if she develops any drainage from the ear or fever.  No orders of the defined types were placed in this encounter.    [name], MD

## 2019-03-31 LAB — BASIC METABOLIC PANEL WITH GFR
BUN: 11 mg/dL (ref 7–25)
CO2: 27 mmol/L (ref 20–32)
Calcium: 9.8 mg/dL (ref 8.6–10.4)
Chloride: 100 mmol/L (ref 98–110)
Creat: 0.83 mg/dL (ref 0.60–0.88)
GFR, Est African American: 76 mL/min/{1.73_m2} (ref >=60)
GFR, Est Non African American: 66 mL/min/{1.73_m2} (ref >=60)
Glucose, Bld: 142 mg/dL — ABNORMAL HIGH (ref 65–139)
Potassium: 4.7 mmol/L (ref 3.5–5.3)
Sodium: 137 mmol/L (ref 135–146)

## 2019-03-31 LAB — HEMOGLOBIN A1C
Hgb A1c MFr Bld: 7.4 % of total Hgb — ABNORMAL HIGH (ref <5.7)
Mean Plasma Glucose: 166 (calc)
eAG (mmol/L): 9.2 (calc)

## 2019-04-16 DIAGNOSIS — R69 Illness, unspecified: Secondary | ICD-10-CM | POA: Diagnosis not present

## 2019-05-14 ENCOUNTER — Other Ambulatory Visit: Payer: Self-pay | Admitting: Family Medicine

## 2019-05-14 DIAGNOSIS — E119 Type 2 diabetes mellitus without complications: Secondary | ICD-10-CM

## 2019-06-26 ENCOUNTER — Other Ambulatory Visit: Payer: Self-pay | Admitting: Family Medicine

## 2019-06-26 DIAGNOSIS — E119 Type 2 diabetes mellitus without complications: Secondary | ICD-10-CM

## 2019-06-28 DIAGNOSIS — R69 Illness, unspecified: Secondary | ICD-10-CM | POA: Diagnosis not present

## 2019-07-01 ENCOUNTER — Other Ambulatory Visit: Payer: Self-pay

## 2019-07-01 ENCOUNTER — Encounter: Payer: Self-pay | Admitting: Family Medicine

## 2019-07-01 ENCOUNTER — Ambulatory Visit (INDEPENDENT_AMBULATORY_CARE_PROVIDER_SITE_OTHER): Payer: Medicare HMO | Admitting: Family Medicine

## 2019-07-01 VITALS — BP 133/58 | Ht 66.0 in

## 2019-07-01 DIAGNOSIS — E119 Type 2 diabetes mellitus without complications: Secondary | ICD-10-CM | POA: Diagnosis not present

## 2019-07-01 DIAGNOSIS — Z6835 Body mass index (BMI) 35.0-35.9, adult: Secondary | ICD-10-CM | POA: Diagnosis not present

## 2019-07-01 DIAGNOSIS — E1129 Type 2 diabetes mellitus with other diabetic kidney complication: Secondary | ICD-10-CM

## 2019-07-01 DIAGNOSIS — Z23 Encounter for immunization: Secondary | ICD-10-CM | POA: Diagnosis not present

## 2019-07-01 DIAGNOSIS — R809 Proteinuria, unspecified: Secondary | ICD-10-CM | POA: Diagnosis not present

## 2019-07-01 LAB — POCT GLYCOSYLATED HEMOGLOBIN (HGB A1C): Hemoglobin A1C: 7.4 % — AB (ref 4.0–5.6)

## 2019-07-01 MED ORDER — XIGDUO XR 5-1000 MG PO TB24: 1.0000 | ORAL_TABLET | ORAL | 1 refills | Status: DC

## 2019-07-01 NOTE — Assessment & Plan Note (Addendum)
Hemoglobin A1c was 7.4 today so stable from previous.  But I am concerned that she is starting to see some blood sugars in the 140s and 150s but she has been more sedentary and admits she has been eating more.  We discussed her allergies around exercise including try to walk around the living room with commercials come on since she does not feel safe getting out and walking outside because of her back problems.  We also discussed maybe switching to a flozin for diabetes which is can be a little bit more powerful than Januvia.  Discussed her diet and 30-day trial that is free with a coupon card for [place] with combination of metformin called Xigduo.  She can let us know the end of the month how she is doing without medication.

## 2019-07-01 NOTE — Assessment & Plan Note (Signed)
Continue with low-dose ACE inhibitor.

## 2019-07-01 NOTE — Progress Notes (Signed)
Established Patient Office Visit  Subjective:  Patient ID: Diana Parsons, female    DOB: 1934-02-26  Age: 85 y.o. MRN: [id number]  CC:  Chief Complaint  Patient presents with  . Diabetes  . Hypertension    HPI Diana Parsons presents for Diabetes - no hypoglycemic events. No wounds or sores that are not healing well. No increased thirst or urination. Checking glucose at home. Taking medications as prescribed without any side effects.  Unfortunately the price of her janumet went up to $300 for 90-day supply she has had her Medicare gap.  Plus she is just felt like it really has not well controlled her blood sugars.  They have mostly been running in the 140s to 150s and she is not happy with that.  Her last A1c was 7.4.  He admits she has been eating more since she is been stuck at home because of COVID and has not been very active or exercising.  She says her back keeps her from exercising as well.  Actually pretty sedentary during the day.  History reviewed. No pertinent past medical history.  Past Surgical History:  Procedure Laterality Date  . ABDOMINAL HYSTERECTOMY  85 yrs old  . left shoulder sx s/p fall  2001   has pin in place  . lumbarp spine surgery  07/2014   Dr. Lurene Shadow    Family History  Problem Relation Age of Onset  . Cancer Mother        colon  . Diabetes Sister   . Diabetes Brother     Social History   Socioeconomic History  . Marital status: Legally Separated    Spouse name: Not on file  . Number of children: Not on file  . Years of education: Not on file  . Highest education level: Not on file  Occupational History  . Not on file  Social Needs  . Financial resource strain: Not on file  . Food insecurity    Worry: Not on file    Inability: Not on file  . Transportation needs    Medical: Not on file    Non-medical: Not on file  Tobacco Use  . Smoking status: Former Smoker    Packs/day: 0.10    Years: 3.00    Pack years: 0.30  . Smokeless  tobacco: Former Network engineer and Sexual Activity  . Alcohol use: No  . Drug use: No  . Sexual activity: Not on file  Lifestyle  . Physical activity    Days per week: Not on file    Minutes per session: Not on file  . Stress: Not on file  Relationships  . Social Herbalist on phone: Not on file    Gets together: Not on file    Attends religious service: Not on file    Active member of club or organization: Not on file    Attends meetings of clubs or organizations: Not on file    Relationship status: Not on file  . Intimate partner violence    Fear of current or ex partner: Not on file    Emotionally abused: Not on file    Physically abused: Not on file    Forced sexual activity: Not on file  Other Topics Concern  . Not on file  Social History Narrative  . Not on file    Outpatient Medications Prior to Visit  Medication Sig Dispense Refill  . aspirin 81 MG tablet Take 81 mg by  mouth daily.    . Blood Glucose Monitoring Suppl (ONETOUCH VERIO) w/Device KIT USE TO TEST ONCE DAILY. DX: E11.9 1 kit PRN  . Calcium Carb-Cholecalciferol (CALCIUM 600/VITAMIN D3 PO) Take 1 tablet by mouth daily.    . fish oil-omega-3 fatty acids 1000 MG capsule Take 1 g by mouth daily.     . fluticasone (FLONASE) 50 MCG/ACT nasal spray Place 1 spray into both nostrils daily. 16 g 6  . glucose blood test strip FOR TESTING BLOOD SUGARS ONCE DAILY. DX:E11.9 100 each 12  . Lancets (ONETOUCH ULTRASOFT) lancets For use for checking blood sugars daily. Dx:E11.9 100 each 11  . lisinopril (ZESTRIL) 5 MG tablet TAKE 1 TABLET BY MOUTH EVERY DAY 90 tablet 1  . Multiple Vitamins-Minerals (OCUVITE PO) Take 1 tablet by mouth daily.    . rosuvastatin (CRESTOR) 20 MG tablet TAKE 1 TABLET BY MOUTH EVERY DAY 90 tablet 3  . JANUMET 50-1000 MG tablet TAKE 1 TABLET BY MOUTH 2 TIMES DAILY WITH A MEAL 60 tablet 5   No facility-administered medications prior to visit.     No Known Allergies  ROS Review of  Systems    Objective:    Physical Exam  Constitutional: She is oriented to person, place, and time. She appears well-developed and well-nourished.  HENT:  Head: Normocephalic and atraumatic.  Cardiovascular: Normal rate, regular rhythm and normal heart sounds.  Pulmonary/Chest: Effort normal and breath sounds normal.  Neurological: She is alert and oriented to person, place, and time.  Skin: Skin is warm and dry.  Psychiatric: She has a normal mood and affect. Her behavior is normal.    BP (!) 133/58   Ht _0  (1.676 m)   BMI 40.03 kg/m  Wt Readings from Last 3 Encounters:  03/30/19 248 lb (112.5 kg)  03/16/19 245 lb (111.1 kg)  01/20/19 245 lb (111.1 kg)     Health Maintenance Due  Topic Date Due  . FOOT EXAM  01/17/2019    There are no preventive care reminders to display for this patient.  No results found for: TSH No results found for: WBC, HGB, HCT, MCV, PLT Lab Results  Component Value Date   NA 137 03/30/2019   K 4.7 03/30/2019   CO2 27 03/30/2019   GLUCOSE 142 (H) 03/30/2019   BUN 11 03/30/2019   CREATININE 0.83 03/30/2019   BILITOT 0.4 07/22/2018   ALKPHOS 62 06/20/2016   AST 26 07/22/2018   ALT 19 07/22/2018   PROT 6.5 07/22/2018   ALBUMIN 3.8 06/20/2016   CALCIUM 9.8 03/30/2019   Lab Results  Component Value Date   CHOL 101 07/22/2018   Lab Results  Component Value Date   HDL 42 (L) 07/22/2018   Lab Results  Component Value Date   LDLCALC 40 07/22/2018   Lab Results  Component Value Date   TRIG 103 07/22/2018   Lab Results  Component Value Date   CHOLHDL 2.4 07/22/2018   Lab Results  Component Value Date   HGBA1C 7.4 (H) 03/30/2019      Assessment & Plan:   Problem List Items Addressed This Visit      Endocrine   Microalbuminuria due to type 2 diabetes mellitus ([place])    Continue with low-dose ACE inhibitor.      Relevant Medications   Dapagliflozin-metFORMIN HCl ER (XIGDUO XR) 02-999 MG TB24   Diabetes mellitus  without complication (HCC) - Primary    Hemoglobin A1c was 7.4 today so stable from previous.  But I am concerned that she is starting to see some blood sugars in the 140s and 150s but she has been more sedentary and admits she has been eating more.  We discussed her allergies around exercise including try to walk around the living room with commercials come on since she does not feel safe getting out and walking outside because of her back problems.  We also discussed maybe switching to a flozin for diabetes which is can be a little bit more powerful than Januvia.  Discussed her diet and 30-day trial that is free with a coupon card for [place] with combination of metformin called Xigduo.  She can let us know the end of the month how she is doing without medication.      Relevant Medications   Dapagliflozin-metFORMIN HCl ER (XIGDUO XR) 02-999 MG TB24   Other Relevant Orders   POCT glycosylated hemoglobin (Hb A1C)     Other   Severe obesity (BMI 35.0-35.9 with comorbidity) ([place])    Discussed strategies around exercise during commercials. Walking around the living room. Doing some leg lifts, etc.       Relevant Medications   Dapagliflozin-metFORMIN HCl ER (XIGDUO XR) 02-999 MG TB24    Other Visit Diagnoses    Need for immunization against influenza       Relevant Orders   Flu Vaccine QUAD High Dose(Fluad) (Completed)      Meds ordered this encounter  Medications  . Dapagliflozin-metFORMIN HCl ER (XIGDUO XR) 02-999 MG TB24    Sig: Take 1 tablet by mouth every morning.    Dispense:  30 tablet    Refill:  1    Follow-up: Return in about 3 months (around 09/30/2019) for Diabetes follow-up.    [name], MD

## 2019-07-03 DIAGNOSIS — Z6834 Body mass index (BMI) 34.0-34.9, adult: Secondary | ICD-10-CM | POA: Insufficient documentation

## 2019-07-03 NOTE — Assessment & Plan Note (Signed)
Discussed strategies around exercise during commercials. Walking around the living room. Doing some leg lifts, etc.

## 2019-07-22 ENCOUNTER — Telehealth: Payer: Self-pay

## 2019-07-22 MED ORDER — XIGDUO XR 5-1000 MG PO TB24: 2.0000 | ORAL_TABLET | ORAL | 1 refills | Status: DC

## 2019-07-22 NOTE — Telephone Encounter (Signed)
Pt advised.

## 2019-07-22 NOTE — Telephone Encounter (Signed)
Patient called stating that since starting her Xigduo on 07/13/19, her blood sugar has still been running high (fasting in AM it was 150s-170s).   Patient wanting to know if medication can be increased to 2 tabs daily. She was advised by the pharmacist at [facility] that this medication could be taken either 2 in AM or 1 tab BID.Diana Parsons please advise

## 2019-07-22 NOTE — Telephone Encounter (Signed)
Yes, okay to increase to 2 tabs in the morning.  I will send over new prescription to the pharmacy.

## 2019-08-29 ENCOUNTER — Other Ambulatory Visit: Payer: Self-pay | Admitting: Family Medicine

## 2019-09-16 DIAGNOSIS — R69 Illness, unspecified: Secondary | ICD-10-CM | POA: Diagnosis not present

## 2019-10-01 ENCOUNTER — Telehealth: Payer: Self-pay | Admitting: Family Medicine

## 2019-10-01 ENCOUNTER — Ambulatory Visit (INDEPENDENT_AMBULATORY_CARE_PROVIDER_SITE_OTHER): Payer: Medicare HMO | Admitting: Family Medicine

## 2019-10-01 ENCOUNTER — Other Ambulatory Visit: Payer: Self-pay

## 2019-10-01 ENCOUNTER — Encounter: Payer: Self-pay | Admitting: Family Medicine

## 2019-10-01 VITALS — BP 132/53 | HR 90 | Ht 66.0 in | Wt 246.0 lb

## 2019-10-01 DIAGNOSIS — N760 Acute vaginitis: Secondary | ICD-10-CM | POA: Diagnosis not present

## 2019-10-01 DIAGNOSIS — E118 Type 2 diabetes mellitus with unspecified complications: Secondary | ICD-10-CM | POA: Diagnosis not present

## 2019-10-01 DIAGNOSIS — E1129 Type 2 diabetes mellitus with other diabetic kidney complication: Secondary | ICD-10-CM | POA: Diagnosis not present

## 2019-10-01 DIAGNOSIS — IMO0002 Reserved for concepts with insufficient information to code with codable children: Secondary | ICD-10-CM

## 2019-10-01 DIAGNOSIS — R809 Proteinuria, unspecified: Secondary | ICD-10-CM

## 2019-10-01 DIAGNOSIS — E7800 Pure hypercholesterolemia, unspecified: Secondary | ICD-10-CM

## 2019-10-01 DIAGNOSIS — E1165 Type 2 diabetes mellitus with hyperglycemia: Secondary | ICD-10-CM | POA: Diagnosis not present

## 2019-10-01 LAB — WET PREP FOR TRICH, YEAST, CLUE
MICRO NUMBER:: 1208180
Specimen Quality: ADEQUATE

## 2019-10-01 LAB — POCT GLYCOSYLATED HEMOGLOBIN (HGB A1C): Hemoglobin A1C: 7.6 % — AB (ref 4.0–5.6)

## 2019-10-01 NOTE — Progress Notes (Signed)
Pt stated that since she has been taking the Xigduo XR 02-999 mg she feels more hungry. She brought in her BS log and also said that she felt like her blood sugars were running higher. I asked about what she eats in the evenings and she told me that she does eat later at night.   She also c/o some vaginal odor and itching but no discharge.  She hasn't had her diabetic eye exam, advised to schedule this.

## 2019-10-01 NOTE — Assessment & Plan Note (Signed)
Spent time discussing options. She feels like the Merleen Nicely makes her fee more hungry but she consumes a lot of fruit and carbs. We discussed cutting back on fruit such as grapes and watermelon.  We discussed cutting back on carbs and balancing with a protein.  Discussed increasing veggies to help her feel more full.  F/U in 3 mo.

## 2019-10-01 NOTE — Assessment & Plan Note (Signed)
Due to recheck lipids. 

## 2019-10-01 NOTE — Patient Instructions (Addendum)
Cut back on grapes and watermelons.   Cut down on your portion size of pasta, chips, cracks, bread, and rice.    Placed a referral to get you scheduled for diabetic eye exam.  We will call you with the swab results either later today or first thing in the morning.

## 2019-10-01 NOTE — Telephone Encounter (Signed)
Please call pt and let her know I forget to give her a labs slip. She can go anytime to the lab. Will need ot fast.

## 2019-10-01 NOTE — Progress Notes (Signed)
Established Patient Office Visit  Subjective:  Patient ID: Diana Parsons, female    DOB: [birth year]  Age: 85 y.o. MRN: [id number]  CC:  Chief Complaint  Patient presents with  . Diabetes    HPI Diana Parsons presents for   Diabetes - no hypoglycemic events. No wounds or sores that are not healing well. No increased thirst or urination. Checking glucose at home. Taking medications as prescribed.  Pt stated that since she has been taking the Xigduo XR 02-999 mg she feels more hungry. She brought in her BS log and also said that she felt like her blood sugars were running higher. I asked about what she eats in the evenings and she told me that she does eat later at night. She eats a lot of grapes, watermelon daily and carbs.  Doesn't  Eat a lot of veggies. Brought in home glucose log. Most sugars running between 140-160s.    She also c/o some vaginal odor and itching but no discharge.  She didn't try thing over-the-counter.  She is on a Fluzone which increases her risk.  She hasn't had her diabetic eye exam, advised to schedule this.  Hyperlipidemia - tolerating stating well with no myalgias or significant side effects.  Lab Results  Component Value Date   CHOL 101 07/22/2018   CHOL 104 08/08/2017   CHOL 108 (L) 06/20/2016   Lab Results  Component Value Date   HDL 42 (L) 07/22/2018   HDL 43 (L) 08/08/2017   HDL 47 06/20/2016   Lab Results  Component Value Date   LDLCALC 40 07/22/2018   LDLCALC 43 08/08/2017   LDLCALC 37 06/20/2016   Lab Results  Component Value Date   TRIG 103 07/22/2018   TRIG 101 08/08/2017   TRIG 120 06/20/2016   Lab Results  Component Value Date   CHOLHDL 2.4 07/22/2018   CHOLHDL 2.4 08/08/2017   CHOLHDL 2.3 06/20/2016   Lab Results  Component Value Date   LDLDIRECT 104 (H) 09/14/2011     No past medical history on file.  Past Surgical History:  Procedure Laterality Date  . ABDOMINAL HYSTERECTOMY  85 yrs old  . left shoulder sx s/p  fall  2001   has pin in place  . lumbarp spine surgery  07/2014   Dr. Lurene Shadow    Family History  Problem Relation Age of Onset  . Cancer Mother        colon  . Diabetes Sister   . Diabetes Brother     Social History   Socioeconomic History  . Marital status: Legally Separated    Spouse name: Not on file  . Number of children: Not on file  . Years of education: Not on file  . Highest education level: Not on file  Occupational History  . Not on file  Tobacco Use  . Smoking status: Former Smoker    Packs/day: 0.10    Years: 3.00    Pack years: 0.30  . Smokeless tobacco: Former Network engineer and Sexual Activity  . Alcohol use: No  . Drug use: No  . Sexual activity: Not on file  Other Topics Concern  . Not on file  Social History Narrative  . Not on file   Social Determinants of Health   Financial Resource Strain:   . Difficulty of Paying Living Expenses: Not on file  Food Insecurity:   . Worried About Charity fundraiser in the Last Year: Not on file  .  Ran Out of Food in the Last Year: Not on file  Transportation Needs:   . Lack of Transportation (Medical): Not on file  . Lack of Transportation (Non-Medical): Not on file  Physical Activity:   . Days of Exercise per Week: Not on file  . Minutes of Exercise per Session: Not on file  Stress:   . Feeling of Stress : Not on file  Social Connections:   . Frequency of Communication with Friends and Family: Not on file  . Frequency of Social Gatherings with Friends and Family: Not on file  . Attends Religious Services: Not on file  . Active Member of Clubs or Organizations: Not on file  . Attends Archivist Meetings: Not on file  . Marital Status: Not on file  Intimate Partner Violence:   . Fear of Current or Ex-Partner: Not on file  . Emotionally Abused: Not on file  . Physically Abused: Not on file  . Sexually Abused: Not on file    Outpatient Medications Prior to Visit  Medication Sig Dispense  Refill  . aspirin 81 MG tablet Take 81 mg by mouth daily.    . Blood Glucose Monitoring Suppl (ONETOUCH VERIO) w/Device KIT USE TO TEST ONCE DAILY. DX: E11.9 1 kit PRN  . Calcium Carb-Cholecalciferol (CALCIUM 600/VITAMIN D3 PO) Take 1 tablet by mouth daily.    . Dapagliflozin-metFORMIN HCl ER (XIGDUO XR) 02-999 MG TB24 Take 2 tablets by mouth every morning. 60 tablet 1  . fish oil-omega-3 fatty acids 1000 MG capsule Take 1 g by mouth daily.     . fluticasone (FLONASE) 50 MCG/ACT nasal spray Place 1 spray into both nostrils daily. 16 g 6  . glucose blood test strip FOR TESTING BLOOD SUGARS ONCE DAILY. DX:E11.9 100 each 12  . Lancets (ONETOUCH ULTRASOFT) lancets For use for checking blood sugars daily. Dx:E11.9 100 each 11  . lisinopril (ZESTRIL) 5 MG tablet TAKE 1 TABLET BY MOUTH EVERY DAY 90 tablet 1  . rosuvastatin (CRESTOR) 20 MG tablet TAKE 1 TABLET BY MOUTH EVERY DAY 90 tablet 3  . Multiple Vitamins-Minerals (OCUVITE PO) Take 1 tablet by mouth daily.     No facility-administered medications prior to visit.    No Known Allergies   ROS Review of Systems    Objective:    Physical Exam  Constitutional: She is oriented to person, place, and time. She appears well-developed and well-nourished.  HENT:  Head: Normocephalic and atraumatic.  Cardiovascular: Normal rate, regular rhythm and normal heart sounds.  Pulmonary/Chest: Effort normal and breath sounds normal.  Neurological: She is alert and oriented to person, place, and time.  Skin: Skin is warm and dry.  Psychiatric: She has a normal mood and affect. Her behavior is normal.    BP (!) 132/53   Pulse 90   Ht 5' 6"  (1.676 m)   Wt 246 lb (111.6 kg)   SpO2 97%   BMI 39.71 kg/m  Wt Readings from Last 3 Encounters:  10/01/19 246 lb (111.6 kg)  03/30/19 248 lb (112.5 kg)  03/16/19 245 lb (111.1 kg)     Health Maintenance Due  Topic Date Due  . OPHTHALMOLOGY EXAM  10/10/2016    There are no preventive care reminders to  display for this patient.  No results found for: TSH No results found for: WBC, HGB, HCT, MCV, PLT Lab Results  Component Value Date   NA 137 03/30/2019   K 4.7 03/30/2019   CO2 27 03/30/2019  GLUCOSE 142 (H) 03/30/2019   BUN 11 03/30/2019   CREATININE 0.83 03/30/2019   BILITOT 0.4 07/22/2018   ALKPHOS 62 06/20/2016   AST 26 07/22/2018   ALT 19 07/22/2018   PROT 6.5 07/22/2018   ALBUMIN 3.8 06/20/2016   CALCIUM 9.8 03/30/2019   Lab Results  Component Value Date   CHOL 101 07/22/2018   Lab Results  Component Value Date   HDL 42 (L) 07/22/2018   Lab Results  Component Value Date   LDLCALC 40 07/22/2018   Lab Results  Component Value Date   TRIG 103 07/22/2018   Lab Results  Component Value Date   CHOLHDL 2.4 07/22/2018   Lab Results  Component Value Date   HGBA1C 7.6 (A) 10/01/2019      Assessment & Plan:   Problem List Items Addressed This Visit      Endocrine   Microalbuminuria due to type 2 diabetes mellitus ([place])    On low dose ACEi.       Diabetes mellitus type 2, uncontrolled, with complications ([place])    Spent time discussing options. She feels like the Merleen Nicely makes her fee more hungry but she consumes a lot of fruit and carbs. We discussed cutting back on fruit such as grapes and watermelon.  We discussed cutting back on carbs and balancing with a protein.  Discussed increasing veggies to help her feel more full.  F/U in 3 mo.        Relevant Orders   POCT HgB A1C (Completed)   Ambulatory referral to [place] [facility]   Lipid panel     Other   HYPERCHOLESTEROLEMIA, PURE    Due to recheck lipids      Relevant Orders   COMPLETE METABOLIC PANEL WITH GFR   Lipid panel    Other Visit Diagnoses    Acute vaginitis    -  Primary   Relevant Orders   WET PREP FOR [place], YEAST, CLUE (Completed)     Acute vaginitis - will perform wet prep and call with results in AM.  Likely yeast. Could be secondary to Xiguo.     No orders of the defined types were placed in this encounter.   Follow-up: Return in about 3 months (around 12/30/2019) for Diabetes follow-up.    [name], MD

## 2019-10-01 NOTE — Assessment & Plan Note (Signed)
On low dose ACEi.

## 2019-10-02 NOTE — Telephone Encounter (Signed)
Called patient and informed of labs. She will go next week and have these done. KG LPN

## 2019-10-05 DIAGNOSIS — E7800 Pure hypercholesterolemia, unspecified: Secondary | ICD-10-CM | POA: Diagnosis not present

## 2019-10-05 DIAGNOSIS — E1165 Type 2 diabetes mellitus with hyperglycemia: Secondary | ICD-10-CM | POA: Diagnosis not present

## 2019-10-05 DIAGNOSIS — E118 Type 2 diabetes mellitus with unspecified complications: Secondary | ICD-10-CM | POA: Diagnosis not present

## 2019-10-06 LAB — COMPLETE METABOLIC PANEL WITH GFR
AG Ratio: 1.6 (calc) (ref 1.0–2.5)
ALT: 24 U/L (ref 6–29)
AST: 28 U/L (ref 10–35)
Albumin: 4.2 g/dL (ref 3.6–5.1)
Alkaline phosphatase (APISO): 61 U/L (ref 37–153)
BUN: 10 mg/dL (ref 7–25)
CO2: 25 mmol/L (ref 20–32)
Calcium: 9.3 mg/dL (ref 8.6–10.4)
Chloride: 105 mmol/L (ref 98–110)
Creat: 0.69 mg/dL (ref 0.60–0.88)
GFR, Est African American: 94 mL/min/{1.73_m2} (ref >=60)
GFR, Est Non African American: 81 mL/min/{1.73_m2} (ref >=60)
Globulin: 2.6 g/dL (calc) (ref 1.9–3.7)
Glucose, Bld: 144 mg/dL — ABNORMAL HIGH (ref 65–99)
Potassium: 4.1 mmol/L (ref 3.5–5.3)
Sodium: 140 mmol/L (ref 135–146)
Total Bilirubin: 0.4 mg/dL (ref 0.2–1.2)
Total Protein: 6.8 g/dL (ref 6.1–8.1)

## 2019-10-06 LAB — LIPID PANEL
Cholesterol: 111 mg/dL (ref <200)
HDL: 40 mg/dL — ABNORMAL LOW (ref >=50)
LDL Cholesterol (Calc): 50 mg/dL (calc)
Non-HDL Cholesterol (Calc): 71 mg/dL (calc) (ref <130)
Total CHOL/HDL Ratio: 2.8 (calc) (ref <5.0)
Triglycerides: 133 mg/dL (ref <150)

## 2019-10-06 NOTE — Progress Notes (Signed)
All labs are normal. 

## 2019-10-21 DIAGNOSIS — E119 Type 2 diabetes mellitus without complications: Secondary | ICD-10-CM | POA: Diagnosis not present

## 2019-10-21 DIAGNOSIS — H524 Presbyopia: Secondary | ICD-10-CM | POA: Diagnosis not present

## 2019-10-21 LAB — HM DIABETES EYE EXAM

## 2019-10-23 ENCOUNTER — Encounter: Payer: Self-pay | Admitting: Family Medicine

## 2019-11-01 DIAGNOSIS — E119 Type 2 diabetes mellitus without complications: Secondary | ICD-10-CM | POA: Diagnosis not present

## 2019-11-01 DIAGNOSIS — R072 Precordial pain: Secondary | ICD-10-CM | POA: Diagnosis not present

## 2019-11-01 DIAGNOSIS — Z79899 Other long term (current) drug therapy: Secondary | ICD-10-CM | POA: Diagnosis not present

## 2019-11-01 DIAGNOSIS — R918 Other nonspecific abnormal finding of lung field: Secondary | ICD-10-CM | POA: Diagnosis not present

## 2019-11-01 DIAGNOSIS — K209 Esophagitis, unspecified without bleeding: Secondary | ICD-10-CM | POA: Diagnosis not present

## 2019-11-01 DIAGNOSIS — Z87891 Personal history of nicotine dependence: Secondary | ICD-10-CM | POA: Diagnosis not present

## 2019-11-01 DIAGNOSIS — Z7984 Long term (current) use of oral hypoglycemic drugs: Secondary | ICD-10-CM | POA: Diagnosis not present

## 2019-11-01 DIAGNOSIS — J159 Unspecified bacterial pneumonia: Secondary | ICD-10-CM | POA: Diagnosis not present

## 2019-11-01 DIAGNOSIS — Z7982 Long term (current) use of aspirin: Secondary | ICD-10-CM | POA: Diagnosis not present

## 2019-11-01 DIAGNOSIS — R079 Chest pain, unspecified: Secondary | ICD-10-CM | POA: Diagnosis not present

## 2019-11-06 ENCOUNTER — Telehealth (INDEPENDENT_AMBULATORY_CARE_PROVIDER_SITE_OTHER): Payer: Medicare HMO | Admitting: Physician Assistant

## 2019-11-06 VITALS — BP 126/65 | HR 119 | Temp 96.8°F | Ht 66.0 in | Wt 246.0 lb

## 2019-11-06 DIAGNOSIS — Z801 Family history of malignant neoplasm of trachea, bronchus and lung: Secondary | ICD-10-CM

## 2019-11-06 DIAGNOSIS — R0601 Orthopnea: Secondary | ICD-10-CM

## 2019-11-06 DIAGNOSIS — J181 Lobar pneumonia, unspecified organism: Secondary | ICD-10-CM

## 2019-11-06 DIAGNOSIS — R0600 Dyspnea, unspecified: Secondary | ICD-10-CM | POA: Diagnosis not present

## 2019-11-06 DIAGNOSIS — K209 Esophagitis, unspecified without bleeding: Secondary | ICD-10-CM | POA: Diagnosis not present

## 2019-11-06 DIAGNOSIS — J189 Pneumonia, unspecified organism: Secondary | ICD-10-CM

## 2019-11-06 MED ORDER — OMEPRAZOLE 40 MG PO CPDR: 40.0000 mg | DELAYED_RELEASE_CAPSULE | Freq: Every day | ORAL | 1 refills | Status: DC

## 2019-11-06 MED ORDER — FAMOTIDINE 20 MG PO TABS: 20.0000 mg | ORAL_TABLET | Freq: Every day | ORAL | 2 refills | Status: DC

## 2019-11-06 NOTE — Progress Notes (Signed)
[facility] hospital - 11/01/2019 pneumonia left lung Lung cancer runs in family - she wants to make sure not cancer  Her brother diagnosed with pneumonia, but it was lung cancer Feels good during the day, issues at night (chest pain when laying down) Mucous drainage Little out of breath Finished Z-pack today, wants to know if she should be on something longer ER doctor does want her to have a stress test  She doesn't think she was tested for Covid (I can't find this in chart either)

## 2019-11-06 NOTE — Progress Notes (Signed)
Patient ID: Diana Parsons, female   DOB: 08-Jun-1934, 86 y.o.   MRN: [id number] .Marland KitchenVirtual Visit via Telephone Note  I connected with IDOLA GANIM on 11/06/2019 at  3:40 PM EST by telephone and verified that I am speaking with the correct person using two identifiers.  Location: Patient: home Provider: clinic   I discussed the limitations, risks, security and privacy concerns of performing an evaluation and management service by telephone and the availability of in person appointments. I also discussed with the patient that there may be a patient responsible charge related to this service. The patient expressed understanding and agreed to proceed.   History of Present Illness: Pt is a 86 yo female with who calls into the clinic for hospital follow up on non-covid bacterial pneumonia of left lung on 11/01/2019.   She went to ED on 1/17 for dull chest pain and gas. Dx of pneumonia and esophagitis. CBC, CMP, troponin negative. CXR showed early possible left basilar infiltrate. EKG no acute changes. She was sent home with pepcid and zpak.   Pt is concerned because lung cancer runs in her family. Her brother was originally dx with pneumonia before he was dx with lung cancer and died a few months later. She denies any significant cough. She does feel like she has some mucus drainage and a little out of breath. She took her last zpak today. During the day she feels pretty good but at night she seems to have some chest pain when laying down. She also mentions some SOB when laying down at night. She has started sleeping in recliner. She feels like she is "compressed" at night. Minimal swelling of ankles.  ED mentioned stress test for her.   .. Active Ambulatory Problems    Diagnosis Date Noted  . Diabetes mellitus type 2, uncontrolled, with complications ([place]) [id number]  . HYPERCHOLESTEROLEMIA, PURE 02/03/2007  . BREAST MASS, LEFT 02/03/2007  . POSTMENOPAUSAL STATUS 02/03/2007  . Osteoarthritis of left  knee 05/28/2012  . Lumbar degenerative disc disease 05/03/2014  . Microalbuminuria due to type 2 diabetes mellitus ([place]) 07/20/2016  . Tear of meniscus of knee 05/29/2013  . Status post lumbar surgery 09/03/2013  . Spinal stenosis of lumbar region without neurogenic claudication 08/30/2013  . Obesity (BMI 30-39.9) 08/13/2014  . Severe obesity (BMI 35.0-35.9 with comorbidity) ([place]) 07/03/2019  . Pneumonia of left lower lobe due to infectious organism 11/09/2019  . Family history of lung cancer 11/09/2019  . Orthopnea 11/09/2019  . PND (paroxysmal nocturnal dyspnea) 11/09/2019  . Esophagitis 11/09/2019   Resolved Ambulatory Problems    Diagnosis Date Noted  . URI 06/30/2010  . Onychia and paronychia of finger 03/25/2008  . SKIN RASH 02/07/2009  . ABNORMAL FINDINGS, ELEVATED BP W/O HTN 02/03/2007   No Additional Past Medical History   Reviewed med, allergy, problem list.    Observations/Objective: No acute distress. Normal mood.  No difficultly or labored breathing.   .. Today's Vitals   11/06/19 1444  BP: 126/65  Pulse: (!) 119  Temp: (!) 96.8 F (36 C)  TempSrc: Oral  Weight: 246 lb (111.6 kg)  Height: 5\' 6"  (1.676 m)   Body mass index is 39.71 kg/m.    Assessment and Plan: Marland KitchenMarland KitchenDavis was seen today for pneumonia.  Diagnoses and all orders for this visit:  Pneumonia of left lower lobe due to infectious organism -     DG Chest 2 View  Orthopnea -     ECHOCARDIOGRAM COMPLETE; Future  PND (  paroxysmal nocturnal dyspnea) -     ECHOCARDIOGRAM COMPLETE; Future  Family history of lung cancer  Esophagitis -     omeprazole (PRILOSEC) 40 MG capsule; Take 1 capsule (40 mg total) by mouth daily. -     famotidine (PEPCID) 20 MG tablet; Take 1 tablet (20 mg total) by mouth at bedtime.   Discussed plan with patient.  Will recheck CXR in 3 week for complete pneumonia clearance. If any residual finding there can order CT of chest.   Symptoms at night do sound more than  esophagitis/reflux. Will add omeprazole to pepcid. Since she is having some "compression" "SOB" like features laying down will get echo to look at valvular function and EF.   Follow up closes and if symptoms worsen or change. Schedule appt with metheney for follow up in 3 weeks after CXR.    Follow Up Instructions:    I discussed the assessment and treatment plan with the patient. The patient was provided an opportunity to ask questions and all were answered. The patient agreed with the plan and demonstrated an understanding of the instructions.   The patient was advised to call back or seek an in-person evaluation if the symptoms worsen or if the condition fails to improve as anticipated.  I provided 25 minutes of non-face-to-face time during this encounter.   [name], PA-C

## 2019-11-09 ENCOUNTER — Encounter: Payer: Self-pay | Admitting: Physician Assistant

## 2019-11-09 DIAGNOSIS — K209 Esophagitis, unspecified without bleeding: Secondary | ICD-10-CM | POA: Insufficient documentation

## 2019-11-09 DIAGNOSIS — J189 Pneumonia, unspecified organism: Secondary | ICD-10-CM | POA: Insufficient documentation

## 2019-11-09 DIAGNOSIS — R0600 Dyspnea, unspecified: Secondary | ICD-10-CM | POA: Insufficient documentation

## 2019-11-09 DIAGNOSIS — R0601 Orthopnea: Secondary | ICD-10-CM | POA: Insufficient documentation

## 2019-11-09 DIAGNOSIS — Z801 Family history of malignant neoplasm of trachea, bronchus and lung: Secondary | ICD-10-CM | POA: Insufficient documentation

## 2019-11-12 ENCOUNTER — Other Ambulatory Visit: Payer: Self-pay | Admitting: Family Medicine

## 2019-11-17 ENCOUNTER — Ambulatory Visit (INDEPENDENT_AMBULATORY_CARE_PROVIDER_SITE_OTHER): Payer: Medicare HMO

## 2019-11-17 ENCOUNTER — Other Ambulatory Visit: Payer: Self-pay

## 2019-11-17 DIAGNOSIS — J189 Pneumonia, unspecified organism: Secondary | ICD-10-CM

## 2019-11-17 NOTE — Progress Notes (Signed)
GREAT news. No pneumonia. Lungs look great.

## 2019-11-18 ENCOUNTER — Ambulatory Visit (HOSPITAL_COMMUNITY): Payer: Medicare HMO | Attending: Cardiology

## 2019-11-18 DIAGNOSIS — R0600 Dyspnea, unspecified: Secondary | ICD-10-CM | POA: Insufficient documentation

## 2019-11-18 DIAGNOSIS — R0601 Orthopnea: Secondary | ICD-10-CM | POA: Diagnosis not present

## 2019-11-20 NOTE — Progress Notes (Signed)
Call pt:   Normal EF. Mild aortic valve sclerosis. No major concerns.

## 2019-11-23 ENCOUNTER — Encounter: Payer: Self-pay | Admitting: Physician Assistant

## 2019-11-23 DIAGNOSIS — I358 Other nonrheumatic aortic valve disorders: Secondary | ICD-10-CM | POA: Insufficient documentation

## 2019-11-23 NOTE — Progress Notes (Signed)
No mam,   It is a aging narrowing of a valve. Certainly if you became more short of breath or had some new swelling we could consider repeating echo.

## 2019-12-02 ENCOUNTER — Other Ambulatory Visit: Payer: Self-pay | Admitting: Family Medicine

## 2019-12-09 ENCOUNTER — Other Ambulatory Visit: Payer: Self-pay | Admitting: Family Medicine

## 2019-12-09 DIAGNOSIS — E119 Type 2 diabetes mellitus without complications: Secondary | ICD-10-CM

## 2019-12-10 DIAGNOSIS — R69 Illness, unspecified: Secondary | ICD-10-CM | POA: Diagnosis not present

## 2019-12-17 ENCOUNTER — Ambulatory Visit: Payer: Medicare HMO | Attending: Internal Medicine

## 2019-12-17 DIAGNOSIS — Z23 Encounter for immunization: Secondary | ICD-10-CM | POA: Insufficient documentation

## 2019-12-17 NOTE — Progress Notes (Signed)
   Covid-19 Vaccination Clinic  Name:  Diana Parsons    MRN: [id number] DOB: 1933/11/27  12/17/2019  [name] was observed post Covid-19 immunization for 15 minutes without incident. She was provided with Vaccine Information Sheet and instruction to access the V-Safe system.   [name] was instructed to call 911 with any severe reactions post vaccine: Marland Kitchen Difficulty breathing  . Swelling of face and throat  . A fast heartbeat  . A bad rash all over body  . Dizziness and weakness

## 2019-12-30 ENCOUNTER — Ambulatory Visit: Payer: Medicare HMO | Admitting: Family Medicine

## 2019-12-31 ENCOUNTER — Other Ambulatory Visit: Payer: Self-pay | Admitting: Family Medicine

## 2019-12-31 DIAGNOSIS — E785 Hyperlipidemia, unspecified: Secondary | ICD-10-CM

## 2020-01-04 ENCOUNTER — Other Ambulatory Visit: Payer: Self-pay | Admitting: Physician Assistant

## 2020-01-04 DIAGNOSIS — K209 Esophagitis, unspecified without bleeding: Secondary | ICD-10-CM

## 2020-01-12 DIAGNOSIS — M25562 Pain in left knee: Secondary | ICD-10-CM | POA: Diagnosis not present

## 2020-01-13 ENCOUNTER — Ambulatory Visit: Payer: Medicare HMO | Attending: Internal Medicine

## 2020-01-13 DIAGNOSIS — Z23 Encounter for immunization: Secondary | ICD-10-CM

## 2020-01-13 NOTE — Progress Notes (Signed)
   Covid-19 Vaccination Clinic  Name:  LYLIA WARRENS    MRN: [id number] DOB: [birth year]  01/13/2020  Ms. Sherman was observed post Covid-19 immunization for 15 minutes without incident. She was provided with Vaccine Information Sheet and instruction to access the V-Safe system.   Ms. Simonyan was instructed to call 911 with any severe reactions post vaccine: Marland Kitchen Difficulty breathing  . Swelling of face and throat  . A fast heartbeat  . A bad rash all over body  . Dizziness and weakness   Immunizations Administered    Name Date Dose VIS Date Route   Pfizer COVID-19 Vaccine 01/13/2020 12:35 PM 0.3 mL 09/25/2019 Intramuscular   Manufacturer: [place]   Lot: [id number]   [place]: [id number]

## 2020-01-14 ENCOUNTER — Ambulatory Visit: Payer: Medicare HMO | Admitting: Family Medicine

## 2020-02-03 ENCOUNTER — Other Ambulatory Visit: Payer: Self-pay

## 2020-02-03 ENCOUNTER — Ambulatory Visit (INDEPENDENT_AMBULATORY_CARE_PROVIDER_SITE_OTHER): Payer: Medicare HMO | Admitting: Family Medicine

## 2020-02-03 ENCOUNTER — Encounter: Payer: Self-pay | Admitting: Family Medicine

## 2020-02-03 VITALS — BP 136/49 | HR 96 | Ht 66.0 in | Wt 234.0 lb

## 2020-02-03 DIAGNOSIS — E1129 Type 2 diabetes mellitus with other diabetic kidney complication: Secondary | ICD-10-CM

## 2020-02-03 DIAGNOSIS — M25562 Pain in left knee: Secondary | ICD-10-CM | POA: Diagnosis not present

## 2020-02-03 DIAGNOSIS — E118 Type 2 diabetes mellitus with unspecified complications: Secondary | ICD-10-CM

## 2020-02-03 DIAGNOSIS — E1165 Type 2 diabetes mellitus with hyperglycemia: Secondary | ICD-10-CM

## 2020-02-03 DIAGNOSIS — IMO0002 Reserved for concepts with insufficient information to code with codable children: Secondary | ICD-10-CM

## 2020-02-03 DIAGNOSIS — R809 Proteinuria, unspecified: Secondary | ICD-10-CM

## 2020-02-03 LAB — POCT GLYCOSYLATED HEMOGLOBIN (HGB A1C): Hemoglobin A1C: 7 % — AB (ref 4.0–5.6)

## 2020-02-03 NOTE — Assessment & Plan Note (Signed)
Once he looks much better today at 7.0.  I still up to get her into the sixes if at all possible she is not having any hypoglycemic events.  She did bring in her glucose log and most of her blood sugars are running consistently in the 140s they are pretty rock steady overall.  If we could get them consistently running in the 130s and that would bring her A1c into the 6 range.

## 2020-02-03 NOTE — Assessment & Plan Note (Signed)
Currently on an ACEi.

## 2020-02-03 NOTE — Progress Notes (Signed)
Established Patient Office Visit  Subjective:  Patient ID: Diana Parsons, female    DOB: 12-26-1933  Age: 86 y.o. MRN: [id number]  CC:  Chief Complaint  Patient presents with  . Diabetes    HPI Diana Parsons presents for   Diabetes - no hypoglycemic events. No wounds or sores that are not healing well. No increased thirst or urination. Checking glucose at home. Taking medications as prescribed without any side effects.  She has cut out some of the fruit that she was eating previously like grapes.  She says she still eats a few like blueberries and apples and bananas but has been trying not to eat as much of it she has been taking her Xigduo regularly.  She says in fact the pharmacy is called her couple times about her medication just ask her how she is doing on it so she wondered why.  Blood pressures have looked phenomenal at home as well.  10 you with low-dose 5 mg lisinopril for renal protection.  She did let me know that she also had a fall at the end of March.  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  She put her notes up on the podium and when she turned around she forgot she was up on a platform and stepped off of it accidentally she injured her left knee.  She did see the orthopedist and had an x-ray.  They noted that she had significant arthritis but no acute injury she says is been gradually getting better since then.  She in fact feels like she is back to her baseline knee pain.  Has had her COVID vaccines.     No past medical history on file.  Past Surgical History:  Procedure Laterality Date  . ABDOMINAL HYSTERECTOMY  86 yrs old  . left shoulder sx s/p fall  2001   has pin in place  . lumbarp spine surgery  07/2014   Dr. Lurene Shadow    Family History  Problem Relation Age of Onset  . Cancer Mother        colon  . Diabetes Sister   . Diabetes Brother   . Cancer Brother        lung cancer    Social History   Socioeconomic History  . Marital  status: Legally Separated    Spouse name: Not on file  . Number of children: Not on file  . Years of education: Not on file  . Highest education level: Not on file  Occupational History  . Not on file  Tobacco Use  . Smoking status: Former Smoker    Packs/day: 0.10    Years: 3.00    Pack years: 0.30  . Smokeless tobacco: Former Network engineer and Sexual Activity  . Alcohol use: No  . Drug use: No  . Sexual activity: Not on file  Other Topics Concern  . Not on file  Social History Narrative  . Not on file   Social Determinants of Health   Financial Resource Strain:   . Difficulty of Paying Living Expenses:   Food Insecurity:   . Worried About Charity fundraiser in the Last Year:   . Arboriculturist in the Last Year:   Transportation Needs:   . Film/video editor (Medical):   Marland Kitchen Lack of Transportation (Non-Medical):   Physical Activity:   . Days of Exercise per Week:   . Minutes of Exercise per Session:  Stress:   . Feeling of Stress :   Social Connections:   . Frequency of Communication with Friends and Family:   . Frequency of Social Gatherings with Friends and Family:   . Attends Religious Services:   . Active Member of Clubs or Organizations:   . Attends Archivist Meetings:   Marland Kitchen Marital Status:   Intimate Partner Violence:   . Fear of Current or Ex-Partner:   . Emotionally Abused:   Marland Kitchen Physically Abused:   . Sexually Abused:     Outpatient Medications Prior to Visit  Medication Sig Dispense Refill  . Accu-Chek FastClix Lancets MISC TEST TWICE A DAY 204 each 12  . aspirin 81 MG tablet Take 81 mg by mouth daily.    . Blood Glucose Monitoring Suppl (ONETOUCH VERIO) w/Device KIT USE TO TEST ONCE DAILY. DX: E11.9 1 kit PRN  . Calcium Carb-Cholecalciferol (CALCIUM 600/VITAMIN D3 PO) Take 1 tablet by mouth daily.    . famotidine (PEPCID) 20 MG tablet Take 1 tablet (20 mg total) by mouth at bedtime. 30 tablet 2  . fish oil-omega-3 fatty acids 1000 MG  capsule Take 1 g by mouth daily.     . fluticasone (FLONASE) 50 MCG/ACT nasal spray Place 1 spray into both nostrils daily. 16 g 6  . lisinopril (ZESTRIL) 5 MG tablet TAKE 1 TABLET BY MOUTH EVERY DAY 90 tablet 1  . omeprazole (PRILOSEC) 40 MG capsule TAKE 1 CAPSULE BY MOUTH EVERY DAY 30 capsule 1  . ONETOUCH VERIO test strip USE TO TEST BLOOD SUGAR ONCE DAILY 100 strip 12  . rosuvastatin (CRESTOR) 20 MG tablet TAKE 1 TABLET BY MOUTH EVERY DAY 90 tablet 3  . XIGDUO XR 02-999 MG TB24 TAKE 2 TABLETS BY MOUTH EVERY DAY IN THE MORNING 60 tablet 5   No facility-administered medications prior to visit.    No Known Allergies  ROS Review of Systems    Objective:    Physical Exam  Constitutional: She is oriented to person, place, and time. She appears well-developed and well-nourished.  HENT:  Head: Normocephalic and atraumatic.  Cardiovascular: Normal rate, regular rhythm and normal heart sounds.  Pulmonary/Chest: Effort normal and breath sounds normal.  Neurological: She is alert and oriented to person, place, and time.  Skin: Skin is warm and dry.  Psychiatric: She has a normal mood and affect. Her behavior is normal.    BP (!) 136/49   Pulse 96   Ht 5' 6"  (1.676 m)   Wt 234 lb (106.1 kg)   SpO2 98%   BMI 37.77 kg/m  Wt Readings from Last 3 Encounters:  02/03/20 234 lb (106.1 kg)  11/06/19 246 lb (111.6 kg)  10/01/19 246 lb (111.6 kg)     There are no preventive care reminders to display for this patient.  There are no preventive care reminders to display for this patient.  No results found for: TSH No results found for: WBC, HGB, HCT, MCV, PLT Lab Results  Component Value Date   NA 140 10/05/2019   K 4.1 10/05/2019   CO2 25 10/05/2019   GLUCOSE 144 (H) 10/05/2019   BUN 10 10/05/2019   CREATININE 0.69 10/05/2019   BILITOT 0.4 10/05/2019   ALKPHOS 62 06/20/2016   AST 28 10/05/2019   ALT 24 10/05/2019   PROT 6.8 10/05/2019   ALBUMIN 3.8 06/20/2016   CALCIUM 9.3  10/05/2019   Lab Results  Component Value Date   CHOL 111 10/05/2019   Lab Results  Component Value Date   HDL 40 (L) 10/05/2019   Lab Results  Component Value Date   LDLCALC 50 10/05/2019   Lab Results  Component Value Date   TRIG 133 10/05/2019   Lab Results  Component Value Date   CHOLHDL 2.8 10/05/2019   Lab Results  Component Value Date   HGBA1C 7.0 (A) 02/03/2020      Assessment & Plan:   Problem List Items Addressed This Visit      Endocrine   Microalbuminuria due to type 2 diabetes mellitus ([place])    Currently on an ACEi.        Diabetes mellitus type 2, uncontrolled, with complications ([place]) - Primary    Once he looks much better today at 7.0.  I still up to get her into the sixes if at all possible she is not having any hypoglycemic events.  She did bring in her glucose log and most of her blood sugars are running consistently in the 140s they are pretty rock steady overall.  If we could get them consistently running in the 130s and that would bring her A1c into the 6 range.      Relevant Orders   POCT glycosylated hemoglobin (Hb A1C) (Completed)    Other Visit Diagnoses    Acute pain of left knee          Left knee pain status post fall-she reports it is gradually gotten better and she feels like she is back to her baseline but I did want to noted in chart in case she has any problems or complications.  No orders of the defined types were placed in this encounter.   Follow-up: Return in about 3 months (around 05/04/2020) for Diabetes follow-up.   Time spent 30 minutes in encounter.  [name], MD

## 2020-02-04 ENCOUNTER — Other Ambulatory Visit: Payer: Self-pay | Admitting: Physician Assistant

## 2020-02-04 DIAGNOSIS — K209 Esophagitis, unspecified without bleeding: Secondary | ICD-10-CM

## 2020-03-02 ENCOUNTER — Other Ambulatory Visit: Payer: Self-pay | Admitting: Family Medicine

## 2020-03-02 DIAGNOSIS — K209 Esophagitis, unspecified without bleeding: Secondary | ICD-10-CM

## 2020-03-09 DIAGNOSIS — R69 Illness, unspecified: Secondary | ICD-10-CM | POA: Diagnosis not present

## 2020-03-10 DIAGNOSIS — R69 Illness, unspecified: Secondary | ICD-10-CM | POA: Diagnosis not present

## 2020-03-25 ENCOUNTER — Other Ambulatory Visit: Payer: Self-pay | Admitting: Family Medicine

## 2020-03-25 DIAGNOSIS — K209 Esophagitis, unspecified without bleeding: Secondary | ICD-10-CM

## 2020-04-08 DIAGNOSIS — I10 Essential (primary) hypertension: Secondary | ICD-10-CM | POA: Diagnosis not present

## 2020-04-08 DIAGNOSIS — Z6838 Body mass index (BMI) 38.0-38.9, adult: Secondary | ICD-10-CM | POA: Diagnosis not present

## 2020-04-08 DIAGNOSIS — Z7984 Long term (current) use of oral hypoglycemic drugs: Secondary | ICD-10-CM | POA: Diagnosis not present

## 2020-04-08 DIAGNOSIS — E1165 Type 2 diabetes mellitus with hyperglycemia: Secondary | ICD-10-CM | POA: Diagnosis not present

## 2020-04-08 DIAGNOSIS — E785 Hyperlipidemia, unspecified: Secondary | ICD-10-CM | POA: Diagnosis not present

## 2020-04-08 DIAGNOSIS — K219 Gastro-esophageal reflux disease without esophagitis: Secondary | ICD-10-CM | POA: Diagnosis not present

## 2020-04-08 DIAGNOSIS — G8929 Other chronic pain: Secondary | ICD-10-CM | POA: Diagnosis not present

## 2020-04-08 DIAGNOSIS — R32 Unspecified urinary incontinence: Secondary | ICD-10-CM | POA: Diagnosis not present

## 2020-04-08 DIAGNOSIS — H3530 Unspecified macular degeneration: Secondary | ICD-10-CM | POA: Diagnosis not present

## 2020-05-04 ENCOUNTER — Ambulatory Visit (INDEPENDENT_AMBULATORY_CARE_PROVIDER_SITE_OTHER): Payer: Medicare HMO | Admitting: Family Medicine

## 2020-05-04 ENCOUNTER — Encounter: Payer: Self-pay | Admitting: Family Medicine

## 2020-05-04 VITALS — BP 132/69 | HR 83 | Ht 66.0 in | Wt 234.0 lb

## 2020-05-04 DIAGNOSIS — E118 Type 2 diabetes mellitus with unspecified complications: Secondary | ICD-10-CM | POA: Diagnosis not present

## 2020-05-04 DIAGNOSIS — R809 Proteinuria, unspecified: Secondary | ICD-10-CM | POA: Diagnosis not present

## 2020-05-04 DIAGNOSIS — E1165 Type 2 diabetes mellitus with hyperglycemia: Secondary | ICD-10-CM

## 2020-05-04 DIAGNOSIS — Z6835 Body mass index (BMI) 35.0-35.9, adult: Secondary | ICD-10-CM | POA: Diagnosis not present

## 2020-05-04 DIAGNOSIS — E1129 Type 2 diabetes mellitus with other diabetic kidney complication: Secondary | ICD-10-CM | POA: Diagnosis not present

## 2020-05-04 DIAGNOSIS — IMO0002 Reserved for concepts with insufficient information to code with codable children: Secondary | ICD-10-CM

## 2020-05-04 LAB — BASIC METABOLIC PANEL WITH GFR
BUN: 13 mg/dL (ref 7–25)
CO2: 28 mmol/L (ref 20–32)
Calcium: 9.7 mg/dL (ref 8.6–10.4)
Chloride: 101 mmol/L (ref 98–110)
Creat: 0.79 mg/dL (ref 0.60–0.88)
GFR, Est African American: 80 mL/min/{1.73_m2} (ref >=60)
GFR, Est Non African American: 69 mL/min/{1.73_m2} (ref >=60)
Glucose, Bld: 107 mg/dL — ABNORMAL HIGH (ref 65–99)
Potassium: 4.3 mmol/L (ref 3.5–5.3)
Sodium: 139 mmol/L (ref 135–146)

## 2020-05-04 LAB — POCT GLYCOSYLATED HEMOGLOBIN (HGB A1C): Hemoglobin A1C: 7.2 % — AB (ref 4.0–5.6)

## 2020-05-04 MED ORDER — LISINOPRIL 5 MG PO TABS: 5.0000 mg | ORAL_TABLET | Freq: Every day | ORAL | 1 refills | Status: DC

## 2020-05-04 MED ORDER — TRULICITY 0.75 MG/0.5ML ~~LOC~~ SOAJ: 0.7500 mg | SUBCUTANEOUS | 3 refills | Status: DC

## 2020-05-04 NOTE — Progress Notes (Signed)
Established Patient Office Visit  Subjective:  Patient ID: Diana Parsons, female    DOB: [birth year]  Age: 86 y.o. MRN: [id number]  CC:  Chief Complaint  Patient presents with  . Diabetes  . Hypertension    HPI Diana Parsons presents for   Diabetes - no hypoglycemic events. No wounds or sores that are not healing well. No increased thirst or urination. Checking glucose at home. Taking medications as prescribed without any side effects.  She did bring in her home glucose log.  She checks her sugar at least once a day.  She reports that she has had her Covid vaccine.  She denies any recent chest pain or shortness of breath.   History reviewed. No pertinent past medical history.  Past Surgical History:  Procedure Laterality Date  . ABDOMINAL HYSTERECTOMY  86 yrs old  . left shoulder sx s/p fall  2001   has pin in place  . lumbarp spine surgery  07/2014   Dr. Lurene Shadow    Family History  Problem Relation Age of Onset  . Cancer Mother        colon  . Diabetes Sister   . Diabetes Brother   . Cancer Brother        lung cancer    Social History   Socioeconomic History  . Marital status: Legally Separated    Spouse name: Not on file  . Number of children: Not on file  . Years of education: Not on file  . Highest education level: Not on file  Occupational History  . Not on file  Tobacco Use  . Smoking status: Former Smoker    Packs/day: 0.10    Years: 3.00    Pack years: 0.30  . Smokeless tobacco: Former Network engineer and Sexual Activity  . Alcohol use: No  . Drug use: No  . Sexual activity: Not on file  Other Topics Concern  . Not on file  Social History Narrative  . Not on file   Social Determinants of Health   Financial Resource Strain:   . Difficulty of Paying Living Expenses:   Food Insecurity:   . Worried About Charity fundraiser in the Last Year:   . Arboriculturist in the Last Year:   Transportation Needs:   . Film/video editor  (Medical):   Marland Kitchen Lack of Transportation (Non-Medical):   Physical Activity:   . Days of Exercise per Week:   . Minutes of Exercise per Session:   Stress:   . Feeling of Stress :   Social Connections:   . Frequency of Communication with Friends and Family:   . Frequency of Social Gatherings with Friends and Family:   . Attends Religious Services:   . Active Member of Clubs or Organizations:   . Attends Archivist Meetings:   Marland Kitchen Marital Status:   Intimate Partner Violence:   . Fear of Current or Ex-Partner:   . Emotionally Abused:   Marland Kitchen Physically Abused:   . Sexually Abused:     Outpatient Medications Prior to Visit  Medication Sig Dispense Refill  . Accu-Chek FastClix Lancets MISC TEST TWICE A DAY 204 each 12  . aspirin 81 MG tablet Take 81 mg by mouth daily.    . Blood Glucose Monitoring Suppl (ONETOUCH VERIO) w/Device KIT USE TO TEST ONCE DAILY. DX: E11.9 1 kit PRN  . Calcium Carb-Cholecalciferol (CALCIUM 600/VITAMIN D3 PO) Take 1 tablet by mouth daily.    Marland Kitchen  famotidine (PEPCID) 20 MG tablet TAKE 1 TABLET BY MOUTH EVERYDAY AT BEDTIME 90 tablet 1  . fish oil-omega-3 fatty acids 1000 MG capsule Take 1 g by mouth daily.     . Multiple Vitamins-Minerals (PRESERVISION AREDS PO) Take 1 tablet by mouth in the morning and at bedtime. One tablet in the morning and one in the evening    . omeprazole (PRILOSEC) 40 MG capsule TAKE 1 CAPSULE BY MOUTH EVERY DAY 30 capsule 11  . ONETOUCH VERIO test strip USE TO TEST BLOOD SUGAR ONCE DAILY 100 strip 12  . rosuvastatin (CRESTOR) 20 MG tablet TAKE 1 TABLET BY MOUTH EVERY DAY 90 tablet 3  . XIGDUO XR 02-999 MG TB24 TAKE 2 TABLETS BY MOUTH EVERY DAY IN THE MORNING 60 tablet 5  . lisinopril (ZESTRIL) 5 MG tablet TAKE 1 TABLET BY MOUTH EVERY DAY 90 tablet 1  . fluticasone (FLONASE) 50 MCG/ACT nasal spray Place 1 spray into both nostrils daily. 16 g 6   No facility-administered medications prior to visit.    No Known Allergies  ROS Review  of Systems    Objective:    Physical Exam  BP 132/69   Pulse 83   Ht _0  (1.676 m)   Wt 234 lb (106.1 kg)   SpO2 98%   BMI 37.77 kg/m  Wt Readings from Last 3 Encounters:  05/04/20 234 lb (106.1 kg)  02/03/20 234 lb (106.1 kg)  11/06/19 246 lb (111.6 kg)     There are no preventive care reminders to display for this patient.  There are no preventive care reminders to display for this patient.  No results found for: TSH No results found for: WBC, HGB, HCT, MCV, PLT Lab Results  Component Value Date   NA 140 10/05/2019   K 4.1 10/05/2019   CO2 25 10/05/2019   GLUCOSE 144 (H) 10/05/2019   BUN 10 10/05/2019   CREATININE 0.69 10/05/2019   BILITOT 0.4 10/05/2019   ALKPHOS 62 06/20/2016   AST 28 10/05/2019   ALT 24 10/05/2019   PROT 6.8 10/05/2019   ALBUMIN 3.8 06/20/2016   CALCIUM 9.3 10/05/2019   Lab Results  Component Value Date   CHOL 111 10/05/2019   Lab Results  Component Value Date   HDL 40 (L) 10/05/2019   Lab Results  Component Value Date   LDLCALC 50 10/05/2019   Lab Results  Component Value Date   TRIG 133 10/05/2019   Lab Results  Component Value Date   CHOLHDL 2.8 10/05/2019   Lab Results  Component Value Date   HGBA1C 7.2 (A) 05/04/2020      Assessment & Plan:   Problem List Items Addressed This Visit      Endocrine   Microalbuminuria due to type 2 diabetes mellitus (HCC)    Continue lisinopril daily       Relevant Medications   Dulaglutide (TRULICITY) 5.64 PP/2.9JJ SOPN   lisinopril (ZESTRIL) 5 MG tablet   Diabetes mellitus type 2, uncontrolled, with complications ([place]) - Primary    Uncontrolled.  A1c went up from previous now 7.2.  We discussed the importance of just making sure she is continue to work on healthy dietary choices and really trying to stay active and avoiding being sedentary during the day.  Blood sugars in general are running mostly in the 140s and 150s.  We discussed trying to get her blood sugars down about  10 points on average.  She is already on the max dose of  Xigduo.  So discussed seeing if her insurance would potentially cover something like Trulicity as should her the demo device.  She feels like she might be able to do the autoinjector on her own.  We discussed potential side effects of the medication.  Plan to follow-up in 3 months.      Relevant Medications   Dulaglutide (TRULICITY) 9.12 QZ/8.3MM SOPN   lisinopril (ZESTRIL) 5 MG tablet   Other Relevant Orders   POCT glycosylated hemoglobin (Hb A1C) (Completed)   BASIC METABOLIC PANEL WITH GFR     Other   Severe obesity (BMI 35.0-35.9 with comorbidity) ([place])    Continue to work on diet and increased activity.  She had lost weight at last OV and her weight is stable.        Relevant Medications   Dulaglutide (TRULICITY) 2.19 IF/1.2XI SOPN      Meds ordered this encounter  Medications  . Dulaglutide (TRULICITY) 7.12 JW/9.0RM SOPN    Sig: Inject 0.5 mLs (0.75 mg total) into the skin once a week.    Dispense:  4 pen    Refill:  3  . lisinopril (ZESTRIL) 5 MG tablet    Sig: Take 1 tablet (5 mg total) by mouth daily.    Dispense:  90 tablet    Refill:  1    Follow-up: Return in about 3 months (around 08/04/2020) for Diabetes follow-up.   Time spent in encounter 22 minutes.  [name], MD

## 2020-05-04 NOTE — Assessment & Plan Note (Addendum)
Uncontrolled.  A1c went up from previous now 7.2.  We discussed the importance of just making sure she is continue to work on healthy dietary choices and really trying to stay active and avoiding being sedentary during the day.  Blood sugars in general are running mostly in the 140s and 150s.  We discussed trying to get her blood sugars down about 10 points on average.  She is already on the max dose of Xigduo.  So discussed seeing if her insurance would potentially cover something like Trulicity as should her the demo device.  She feels like she might be able to do the autoinjector on her own.  We discussed potential side effects of the medication.  Plan to follow-up in 3 months.

## 2020-05-04 NOTE — Assessment & Plan Note (Signed)
Continue lisinopril daily.

## 2020-05-04 NOTE — Assessment & Plan Note (Signed)
Continue to work on diet and increased activity.  She had lost weight at last OV and her weight is stable.

## 2020-05-05 NOTE — Progress Notes (Signed)
All labs are normal. 

## 2020-05-11 DIAGNOSIS — H527 Unspecified disorder of refraction: Secondary | ICD-10-CM | POA: Diagnosis not present

## 2020-05-11 DIAGNOSIS — H353132 Nonexudative age-related macular degeneration, bilateral, intermediate dry stage: Secondary | ICD-10-CM | POA: Diagnosis not present

## 2020-05-11 DIAGNOSIS — H52203 Unspecified astigmatism, bilateral: Secondary | ICD-10-CM | POA: Diagnosis not present

## 2020-05-11 DIAGNOSIS — H02831 Dermatochalasis of right upper eyelid: Secondary | ICD-10-CM | POA: Diagnosis not present

## 2020-05-11 DIAGNOSIS — H25813 Combined forms of age-related cataract, bilateral: Secondary | ICD-10-CM | POA: Diagnosis not present

## 2020-05-11 DIAGNOSIS — E119 Type 2 diabetes mellitus without complications: Secondary | ICD-10-CM | POA: Diagnosis not present

## 2020-05-11 DIAGNOSIS — H02834 Dermatochalasis of left upper eyelid: Secondary | ICD-10-CM | POA: Diagnosis not present

## 2020-05-11 LAB — HM DIABETES EYE EXAM

## 2020-05-17 DIAGNOSIS — E119 Type 2 diabetes mellitus without complications: Secondary | ICD-10-CM | POA: Diagnosis not present

## 2020-05-17 DIAGNOSIS — H25811 Combined forms of age-related cataract, right eye: Secondary | ICD-10-CM | POA: Diagnosis not present

## 2020-05-17 DIAGNOSIS — Z79899 Other long term (current) drug therapy: Secondary | ICD-10-CM | POA: Diagnosis not present

## 2020-05-17 DIAGNOSIS — I10 Essential (primary) hypertension: Secondary | ICD-10-CM | POA: Diagnosis not present

## 2020-05-17 DIAGNOSIS — M1990 Unspecified osteoarthritis, unspecified site: Secondary | ICD-10-CM | POA: Diagnosis not present

## 2020-05-17 DIAGNOSIS — Z87891 Personal history of nicotine dependence: Secondary | ICD-10-CM | POA: Diagnosis not present

## 2020-05-17 DIAGNOSIS — E669 Obesity, unspecified: Secondary | ICD-10-CM | POA: Diagnosis not present

## 2020-05-17 DIAGNOSIS — Z6837 Body mass index (BMI) 37.0-37.9, adult: Secondary | ICD-10-CM | POA: Diagnosis not present

## 2020-05-17 DIAGNOSIS — Z7982 Long term (current) use of aspirin: Secondary | ICD-10-CM | POA: Diagnosis not present

## 2020-05-17 DIAGNOSIS — E785 Hyperlipidemia, unspecified: Secondary | ICD-10-CM | POA: Diagnosis not present

## 2020-05-17 DIAGNOSIS — E1136 Type 2 diabetes mellitus with diabetic cataract: Secondary | ICD-10-CM | POA: Diagnosis not present

## 2020-05-17 DIAGNOSIS — Z9889 Other specified postprocedural states: Secondary | ICD-10-CM | POA: Diagnosis not present

## 2020-05-23 ENCOUNTER — Telehealth: Payer: Self-pay

## 2020-05-23 NOTE — Telephone Encounter (Signed)
Pt advised and will come by to p/u at front desk.

## 2020-05-23 NOTE — Telephone Encounter (Signed)
Diana Parsons called and left a message requesting a handicap placard.

## 2020-05-24 ENCOUNTER — Encounter: Payer: Self-pay | Admitting: Family Medicine

## 2020-06-01 ENCOUNTER — Other Ambulatory Visit: Payer: Self-pay | Admitting: Family Medicine

## 2020-06-02 DIAGNOSIS — E1136 Type 2 diabetes mellitus with diabetic cataract: Secondary | ICD-10-CM | POA: Diagnosis not present

## 2020-06-02 DIAGNOSIS — H25812 Combined forms of age-related cataract, left eye: Secondary | ICD-10-CM | POA: Insufficient documentation

## 2020-06-02 DIAGNOSIS — H353130 Nonexudative age-related macular degeneration, bilateral, stage unspecified: Secondary | ICD-10-CM | POA: Diagnosis not present

## 2020-06-02 DIAGNOSIS — Z6837 Body mass index (BMI) 37.0-37.9, adult: Secondary | ICD-10-CM | POA: Diagnosis not present

## 2020-06-02 DIAGNOSIS — I10 Essential (primary) hypertension: Secondary | ICD-10-CM | POA: Diagnosis not present

## 2020-06-02 DIAGNOSIS — Z9841 Cataract extraction status, right eye: Secondary | ICD-10-CM | POA: Diagnosis not present

## 2020-06-02 DIAGNOSIS — H52223 Regular astigmatism, bilateral: Secondary | ICD-10-CM | POA: Diagnosis not present

## 2020-06-02 DIAGNOSIS — M1990 Unspecified osteoarthritis, unspecified site: Secondary | ICD-10-CM | POA: Diagnosis not present

## 2020-06-02 DIAGNOSIS — E785 Hyperlipidemia, unspecified: Secondary | ICD-10-CM | POA: Diagnosis not present

## 2020-06-02 DIAGNOSIS — E669 Obesity, unspecified: Secondary | ICD-10-CM | POA: Diagnosis not present

## 2020-06-02 DIAGNOSIS — Z87891 Personal history of nicotine dependence: Secondary | ICD-10-CM | POA: Diagnosis not present

## 2020-06-02 DIAGNOSIS — Z9889 Other specified postprocedural states: Secondary | ICD-10-CM | POA: Diagnosis not present

## 2020-06-02 DIAGNOSIS — Z961 Presence of intraocular lens: Secondary | ICD-10-CM | POA: Diagnosis not present

## 2020-06-03 ENCOUNTER — Encounter: Payer: Self-pay | Admitting: Family Medicine

## 2020-06-07 DIAGNOSIS — R69 Illness, unspecified: Secondary | ICD-10-CM | POA: Diagnosis not present

## 2020-06-08 DIAGNOSIS — R69 Illness, unspecified: Secondary | ICD-10-CM | POA: Diagnosis not present

## 2020-07-14 DIAGNOSIS — Z0100 Encounter for examination of eyes and vision without abnormal findings: Secondary | ICD-10-CM | POA: Diagnosis not present

## 2020-07-22 DIAGNOSIS — R69 Illness, unspecified: Secondary | ICD-10-CM | POA: Diagnosis not present

## 2020-07-24 ENCOUNTER — Other Ambulatory Visit: Payer: Self-pay | Admitting: Family Medicine

## 2020-07-24 DIAGNOSIS — K209 Esophagitis, unspecified without bleeding: Secondary | ICD-10-CM

## 2020-08-01 ENCOUNTER — Other Ambulatory Visit: Payer: Self-pay

## 2020-08-01 ENCOUNTER — Encounter: Payer: Self-pay | Admitting: Family Medicine

## 2020-08-01 ENCOUNTER — Ambulatory Visit (INDEPENDENT_AMBULATORY_CARE_PROVIDER_SITE_OTHER): Payer: Medicare HMO | Admitting: Family Medicine

## 2020-08-01 VITALS — BP 127/46 | HR 76 | Ht 66.0 in | Wt 223.0 lb

## 2020-08-01 DIAGNOSIS — R809 Proteinuria, unspecified: Secondary | ICD-10-CM | POA: Diagnosis not present

## 2020-08-01 DIAGNOSIS — Z6835 Body mass index (BMI) 35.0-35.9, adult: Secondary | ICD-10-CM | POA: Diagnosis not present

## 2020-08-01 DIAGNOSIS — E1165 Type 2 diabetes mellitus with hyperglycemia: Secondary | ICD-10-CM

## 2020-08-01 DIAGNOSIS — E118 Type 2 diabetes mellitus with unspecified complications: Secondary | ICD-10-CM

## 2020-08-01 DIAGNOSIS — IMO0002 Reserved for concepts with insufficient information to code with codable children: Secondary | ICD-10-CM

## 2020-08-01 DIAGNOSIS — E1129 Type 2 diabetes mellitus with other diabetic kidney complication: Secondary | ICD-10-CM | POA: Diagnosis not present

## 2020-08-01 DIAGNOSIS — Z599 Problem related to housing and economic circumstances, unspecified: Secondary | ICD-10-CM | POA: Diagnosis not present

## 2020-08-01 LAB — POCT GLYCOSYLATED HEMOGLOBIN (HGB A1C): Hemoglobin A1C: 7.3 % — AB (ref 4.0–5.6)

## 2020-08-01 NOTE — Assessment & Plan Note (Signed)
Continue daily ACE inhibitor.  For updated labs in December.

## 2020-08-01 NOTE — Assessment & Plan Note (Signed)
A1c really did not improve so it sounds like her fastings are much better and she has lost more weight which is fantastic but she admits she has been snacking on grapes again so we discussed cutting that back out continue to work on most healthy food choices and staying active.

## 2020-08-01 NOTE — Progress Notes (Signed)
Established Patient Office Visit  Subjective:  Patient ID: Diana Parsons, female    DOB: [birth year]  Age: 86 y.o. MRN: [id number]  CC:  Chief Complaint  Patient presents with  . Diabetes  . Hypertension    HPI [name] presents for   Diabetes - no hypoglycemic events. No wounds or sores that are not healing well. No increased thirst or urination. Checking glucose at home. Fasting now down to 120-130.  Taking medications as prescribed without any side effects. Trulicity is helping suppress her appetite. Has lost 11 lbs.    is really been struggling affording her medications she had her Medicare gap in July.  Hypertension- Pt denies chest pain, SOB, dizziness, or heart palpitations.  Taking meds as directed w/o problems.  Denies medication side effects.    She did want a let me know that she gave her house to her son and daughter-in-law and she has now moved into an apartment in [place] which she really loves.  They are actually paying her rent and she feels like now she can afford her medications better.  No past medical history on file.  Past Surgical History:  Procedure Laterality Date  . ABDOMINAL HYSTERECTOMY  86 yrs old  . left shoulder sx s/p fall  2001   has pin in place  . lumbarp spine surgery  07/2014   Dr. Lurene Shadow    Family History  Problem Relation Age of Onset  . Cancer Mother        colon  . Diabetes Sister   . Diabetes Brother   . Cancer Brother        lung cancer    Social History   Socioeconomic History  . Marital status: Legally Separated    Spouse name: Not on file  . Number of children: Not on file  . Years of education: Not on file  . Highest education level: Not on file  Occupational History  . Not on file  Tobacco Use  . Smoking status: Former Smoker    Packs/day: 0.10    Years: 3.00    Pack years: 0.30  . Smokeless tobacco: Former Network engineer and Sexual Activity  . Alcohol use: No  . Drug use: No  . Sexual activity: Not  on file  Other Topics Concern  . Not on file  Social History Narrative  . Not on file   Social Determinants of Health   Financial Resource Strain:   . Difficulty of Paying Living Expenses: Not on file  Food Insecurity:   . Worried About Charity fundraiser in the Last Year: Not on file  . Ran Out of Food in the Last Year: Not on file  Transportation Needs:   . Lack of Transportation (Medical): Not on file  . Lack of Transportation (Non-Medical): Not on file  Physical Activity:   . Days of Exercise per Week: Not on file  . Minutes of Exercise per Session: Not on file  Stress:   . Feeling of Stress : Not on file  Social Connections:   . Frequency of Communication with Friends and Family: Not on file  . Frequency of Social Gatherings with Friends and Family: Not on file  . Attends Religious Services: Not on file  . Active Member of Clubs or Organizations: Not on file  . Attends Archivist Meetings: Not on file  . Marital Status: Not on file  Intimate Partner Violence:   . Fear of Current or  Ex-Partner: Not on file  . Emotionally Abused: Not on file  . Physically Abused: Not on file  . Sexually Abused: Not on file    Outpatient Medications Prior to Visit  Medication Sig Dispense Refill  . Accu-Chek FastClix Lancets MISC TEST TWICE A DAY 204 each 12  . aspirin 81 MG tablet Take 81 mg by mouth daily.    . Blood Glucose Monitoring Suppl (ONETOUCH VERIO) w/Device KIT USE TO TEST ONCE DAILY. DX: E11.9 1 kit PRN  . Calcium Carb-Cholecalciferol (CALCIUM 600/VITAMIN D3 PO) Take 1 tablet by mouth daily.    . Dulaglutide (TRULICITY) 5.72 IO/0.3TD SOPN Inject 0.5 mLs (0.75 mg total) into the skin once a week. 4 pen 3  . famotidine (PEPCID) 20 MG tablet TAKE 1 TABLET BY MOUTH EVERYDAY AT BEDTIME 90 tablet 4  . fish oil-omega-3 fatty acids 1000 MG capsule Take 1 g by mouth daily.     Marland Kitchen lisinopril (ZESTRIL) 5 MG tablet Take 1 tablet (5 mg total) by mouth daily. 90 tablet 1  .  Multiple Vitamins-Minerals (PRESERVISION AREDS PO) Take 1 tablet by mouth in the morning and at bedtime. One tablet in the morning and one in the evening    . omeprazole (PRILOSEC) 40 MG capsule TAKE 1 CAPSULE BY MOUTH EVERY DAY 30 capsule 11  . ONETOUCH VERIO test strip USE TO TEST BLOOD SUGAR ONCE DAILY 100 strip 12  . rosuvastatin (CRESTOR) 20 MG tablet TAKE 1 TABLET BY MOUTH EVERY DAY 90 tablet 3  . XIGDUO XR 02-999 MG TB24 TAKE 2 TABLETS BY MOUTH EVERY MORNING 60 tablet 5   No facility-administered medications prior to visit.    No Known Allergies  ROS Review of Systems    Objective:    Physical Exam Constitutional:      Appearance: She is well-developed.  HENT:     Head: Normocephalic and atraumatic.  Cardiovascular:     Rate and Rhythm: Normal rate and regular rhythm.     Heart sounds: Normal heart sounds.  Pulmonary:     Effort: Pulmonary effort is normal.     Breath sounds: Normal breath sounds.  Skin:    General: Skin is warm and dry.  Neurological:     Mental Status: She is alert and oriented to person, place, and time.  Psychiatric:        Behavior: Behavior normal.     BP (!) 141/55   Pulse 83   Ht 5' 6"  (1.676 m)   Wt 223 lb (101.2 kg)   SpO2 98%   BMI 35.99 kg/m  Wt Readings from Last 3 Encounters:  08/01/20 223 lb (101.2 kg)  05/04/20 234 lb (106.1 kg)  02/03/20 234 lb (106.1 kg)     Health Maintenance Due  Topic Date Due  . COLONOSCOPY  05/30/2018  . INFLUENZA VACCINE  05/15/2020    There are no preventive care reminders to display for this patient.  No results found for: TSH No results found for: WBC, HGB, HCT, MCV, PLT Lab Results  Component Value Date   NA 139 05/04/2020   K 4.3 05/04/2020   CO2 28 05/04/2020   GLUCOSE 107 (H) 05/04/2020   BUN 13 05/04/2020   CREATININE 0.79 05/04/2020   BILITOT 0.4 10/05/2019   ALKPHOS 62 06/20/2016   AST 28 10/05/2019   ALT 24 10/05/2019   PROT 6.8 10/05/2019   ALBUMIN 3.8 06/20/2016    CALCIUM 9.7 05/04/2020   Lab Results  Component Value Date  CHOL 111 10/05/2019   Lab Results  Component Value Date   HDL 40 (L) 10/05/2019   Lab Results  Component Value Date   LDLCALC 50 10/05/2019   Lab Results  Component Value Date   TRIG 133 10/05/2019   Lab Results  Component Value Date   CHOLHDL 2.8 10/05/2019   Lab Results  Component Value Date   HGBA1C 7.3 (A) 08/01/2020      Assessment & Plan:   Problem List Items Addressed This Visit      Endocrine   Microalbuminuria due to type 2 diabetes mellitus ([place])    Continue daily ACE inhibitor.  For updated labs in December.      Diabetes mellitus type 2, uncontrolled, with complications ([place]) - Primary    A1c really did not improve so it sounds like her fastings are much better and she has lost more weight which is fantastic but she admits she has been snacking on grapes again so we discussed cutting that back out continue to work on most healthy food choices and staying active.      Relevant Orders   POCT glycosylated hemoglobin (Hb A1C) (Completed)     Other   Severe obesity (BMI 35.0-35.9 with comorbidity) ([place])    BMI 35 she is really done a fantastic job with weight loss just really encouraged her to continue to work on healthy diet and staying active.  I had love to see her get down to about 200 pounds.       Other Visit Diagnoses    Financial difficulties         Encouraged her to check on patient assistance with her medication since she has hit her medicare gap.    No orders of the defined types were placed in this encounter.  I spent 30 minutes on the day of the encounter to include pre-visit record review, face-to-face time with the patient and post visit ordering of test.   Follow-up: Return in about 3 months (around 11/01/2020) for Diabetes follow-up.    [name], MD

## 2020-08-01 NOTE — Assessment & Plan Note (Signed)
BMI 35 she is really done a fantastic job with weight loss just really encouraged her to continue to work on healthy diet and staying active.  I had love to see her get down to about 200 pounds.

## 2020-08-04 ENCOUNTER — Ambulatory Visit: Payer: Medicare HMO | Admitting: Family Medicine

## 2020-08-19 ENCOUNTER — Other Ambulatory Visit: Payer: Self-pay | Admitting: Family Medicine

## 2020-09-03 DIAGNOSIS — R69 Illness, unspecified: Secondary | ICD-10-CM | POA: Diagnosis not present

## 2020-10-26 DIAGNOSIS — H524 Presbyopia: Secondary | ICD-10-CM | POA: Diagnosis not present

## 2020-10-26 DIAGNOSIS — E119 Type 2 diabetes mellitus without complications: Secondary | ICD-10-CM | POA: Diagnosis not present

## 2020-11-02 ENCOUNTER — Ambulatory Visit: Payer: Medicare HMO | Admitting: Family Medicine

## 2020-11-16 ENCOUNTER — Encounter: Payer: Self-pay | Admitting: Family Medicine

## 2020-11-16 ENCOUNTER — Ambulatory Visit (INDEPENDENT_AMBULATORY_CARE_PROVIDER_SITE_OTHER): Payer: Medicare HMO | Admitting: Family Medicine

## 2020-11-16 ENCOUNTER — Other Ambulatory Visit: Payer: Self-pay

## 2020-11-16 VITALS — BP 136/52 | HR 97 | Ht 66.0 in | Wt 214.0 lb

## 2020-11-16 DIAGNOSIS — E118 Type 2 diabetes mellitus with unspecified complications: Secondary | ICD-10-CM

## 2020-11-16 DIAGNOSIS — IMO0002 Reserved for concepts with insufficient information to code with codable children: Secondary | ICD-10-CM

## 2020-11-16 DIAGNOSIS — E1129 Type 2 diabetes mellitus with other diabetic kidney complication: Secondary | ICD-10-CM

## 2020-11-16 DIAGNOSIS — R809 Proteinuria, unspecified: Secondary | ICD-10-CM

## 2020-11-16 DIAGNOSIS — Z6834 Body mass index (BMI) 34.0-34.9, adult: Secondary | ICD-10-CM | POA: Diagnosis not present

## 2020-11-16 DIAGNOSIS — R059 Cough, unspecified: Secondary | ICD-10-CM

## 2020-11-16 DIAGNOSIS — E1165 Type 2 diabetes mellitus with hyperglycemia: Secondary | ICD-10-CM | POA: Diagnosis not present

## 2020-11-16 LAB — POCT GLYCOSYLATED HEMOGLOBIN (HGB A1C): Hemoglobin A1C: 6.3 % — AB (ref 4.0–5.6)

## 2020-11-16 NOTE — Assessment & Plan Note (Signed)
She is actually done fantastic with weight loss since I last saw her.  Just encouraged her to also increase her activity level she is not currently exercising.  I think just doing some stretching and walking purposefully on a daily basis would be really helpful.  She has done a great job.

## 2020-11-16 NOTE — Assessment & Plan Note (Signed)
A1c looks phenomenal today at 6.3.  Blood glucose log looks great she has not had any hypoglycemic events.  Discussed continuing current regimen follow-up in 3 months if at that point time she still doing well we could consider discontinuing the embolize of component of her Kombiglyze and may be increasing her Trulicity dose to 1.5 mg and she is doing well.  Due for updated lab work.

## 2020-11-16 NOTE — Progress Notes (Signed)
Established Patient Office Visit  Subjective:  Patient ID: TEMPEST FRANKLAND, female    DOB: 1934-03-25  Age: 87 y.o. MRN: [id number]  CC:  Chief Complaint  Patient presents with  . Diabetes  . Hypertension    HPI [name] presents for  Diabetes - no hypoglycemic events. No wounds or sores that are not healing well. No increased thirst or urination. Checking glucose at home. Taking medications as prescribed without any side effects.  She also reports that at night she often wakes choking she feels like there is phlegm or something stuck in her throat.  She will have to sit up and try to cough and clear it.  She does not feel like she is having any heartburn or reflux problems she feels more like there is some drainage from her sinuses she had a little mild congestion but no fevers chills sweats etc.  No facial pain.   History reviewed. No pertinent past medical history.  Past Surgical History:  Procedure Laterality Date  . ABDOMINAL HYSTERECTOMY  87 yrs old  . left shoulder sx s/p fall  2001   has pin in place  . lumbarp spine surgery  07/2014   Dr. Lurene Shadow    Family History  Problem Relation Age of Onset  . Cancer Mother        colon  . Diabetes Sister   . Diabetes Brother   . Cancer Brother        lung cancer    Social History   Socioeconomic History  . Marital status: Legally Separated    Spouse name: Not on file  . Number of children: Not on file  . Years of education: Not on file  . Highest education level: Not on file  Occupational History  . Not on file  Tobacco Use  . Smoking status: Former Smoker    Packs/day: 0.10    Years: 3.00    Pack years: 0.30  . Smokeless tobacco: Former Network engineer and Sexual Activity  . Alcohol use: No  . Drug use: No  . Sexual activity: Not on file  Other Topics Concern  . Not on file  Social History Narrative  . Not on file   Social Determinants of Health   Financial Resource Strain: Not on file  Food  Insecurity: Not on file  Transportation Needs: Not on file  Physical Activity: Not on file  Stress: Not on file  Social Connections: Not on file  Intimate Partner Violence: Not on file    Outpatient Medications Prior to Visit  Medication Sig Dispense Refill  . Accu-Chek FastClix Lancets MISC TEST TWICE A DAY 204 each 12  . aspirin 81 MG tablet Take 81 mg by mouth daily.    . Blood Glucose Monitoring Suppl (ONETOUCH VERIO) w/Device KIT USE TO TEST ONCE DAILY. DX: E11.9 1 kit PRN  . Calcium Carb-Cholecalciferol (CALCIUM 600/VITAMIN D3 PO) Take 1 tablet by mouth daily.    . famotidine (PEPCID) 20 MG tablet TAKE 1 TABLET BY MOUTH EVERYDAY AT BEDTIME 90 tablet 4  . fish oil-omega-3 fatty acids 1000 MG capsule Take 1 g by mouth daily.     Marland Kitchen lisinopril (ZESTRIL) 5 MG tablet Take 1 tablet (5 mg total) by mouth daily. 90 tablet 1  . Multiple Vitamins-Minerals (PRESERVISION AREDS PO) Take 1 tablet by mouth in the morning and at bedtime. One tablet in the morning and one in the evening    . omeprazole (PRILOSEC) 40 MG  capsule TAKE 1 CAPSULE BY MOUTH EVERY DAY 30 capsule 11  . ONETOUCH VERIO test strip USE TO TEST BLOOD SUGAR ONCE DAILY 100 strip 12  . rosuvastatin (CRESTOR) 20 MG tablet TAKE 1 TABLET BY MOUTH EVERY DAY 90 tablet 3  . TRULICITY 6.94 WN/4.6EV SOPN INJECT 0.5 MLS (0.75 MG TOTAL) INTO THE SKIN ONCE A WEEK. 2 mL 3  . XIGDUO XR 02-999 MG TB24 TAKE 2 TABLETS BY MOUTH EVERY MORNING 60 tablet 5   No facility-administered medications prior to visit.    No Known Allergies  ROS Review of Systems    Objective:    Physical Exam Constitutional:      Appearance: She is well-developed and well-nourished.  HENT:     Head: Normocephalic and atraumatic.  Cardiovascular:     Rate and Rhythm: Normal rate and regular rhythm.     Heart sounds: Normal heart sounds.  Pulmonary:     Effort: Pulmonary effort is normal.     Breath sounds: Normal breath sounds.  Skin:    General: Skin is warm  and dry.  Neurological:     Mental Status: She is alert and oriented to person, place, and time.  Psychiatric:        Mood and Affect: Mood and affect normal.        Behavior: Behavior normal.     BP (!) 136/52   Pulse 97   Ht 5' 6"  (1.676 m)   Wt 214 lb (97.1 kg)   SpO2 97%   BMI 34.54 kg/m  Wt Readings from Last 3 Encounters:  11/16/20 214 lb (97.1 kg)  08/01/20 223 lb (101.2 kg)  05/04/20 234 lb (106.1 kg)     Health Maintenance Due  Topic Date Due  . COLONOSCOPY (Pts 45-70yr Insurance coverage will need to be confirmed)  05/30/2018  . FOOT EXAM  09/30/2020    There are no preventive care reminders to display for this patient.  No results found for: TSH No results found for: WBC, HGB, HCT, MCV, PLT Lab Results  Component Value Date   NA 139 05/04/2020   K 4.3 05/04/2020   CO2 28 05/04/2020   GLUCOSE 107 (H) 05/04/2020   BUN 13 05/04/2020   CREATININE 0.79 05/04/2020   BILITOT 0.4 10/05/2019   ALKPHOS 62 06/20/2016   AST 28 10/05/2019   ALT 24 10/05/2019   PROT 6.8 10/05/2019   ALBUMIN 3.8 06/20/2016   CALCIUM 9.7 05/04/2020   Lab Results  Component Value Date   CHOL 111 10/05/2019   Lab Results  Component Value Date   HDL 40 (L) 10/05/2019   Lab Results  Component Value Date   LDLCALC 50 10/05/2019   Lab Results  Component Value Date   TRIG 133 10/05/2019   Lab Results  Component Value Date   CHOLHDL 2.8 10/05/2019   Lab Results  Component Value Date   HGBA1C 6.3 (A) 11/16/2020      Assessment & Plan:   Problem List Items Addressed This Visit      Endocrine   [place]in[place] due to [place] 2 diabetes [place]    Continuelow-dose ACE inhibitor though I do want keep an eye on her blood pressure especially if she continues to lose weight.      Controlled diabetes mellitus type 2 with complications (HCC) - Primary    A1c looks phenomenal today at 6.3.  Blood glucose log looks great she has not had any hypoglycemic events.   Discussed continuing current  regimen follow-up in 3 months if at that point time she still doing well we could consider discontinuing the embolize of component of her Kombiglyze and may be increasing her Trulicity dose to 1.5 mg and she is doing well.  Due for updated lab work.        Other   BMI 34.0-34.9,adult    She is actually done fantastic with weight loss since I last saw her.  Just encouraged her to also increase her activity level she is not currently exercising.  I think just doing some stretching and walking purposefully on a daily basis would be really helpful.  She has done a great job.       Other Visit Diagnoses    Cough         Cough suspect secondary to either postnasal drip or GERD.  We discussed running a humidifier at night, using her nasal saline as well as Flonase if her symptoms do not improve over the next 2 weeks then we could consider a trial of Atrovent nasal spray though it can be excessively drying so would need to monitor carefully or consider trial of treatment for GERD.  No orders of the defined types were placed in this encounter.   Follow-up: Return in about 3 months (around 02/13/2021) for Diabetes follow-up.    [name], MD

## 2020-11-16 NOTE — Assessment & Plan Note (Addendum)
Continuelow-dose ACE inhibitor though I do want keep an eye on her blood pressure especially if she continues to lose weight.

## 2020-11-18 DIAGNOSIS — E118 Type 2 diabetes mellitus with unspecified complications: Secondary | ICD-10-CM | POA: Diagnosis not present

## 2020-11-18 DIAGNOSIS — E1165 Type 2 diabetes mellitus with hyperglycemia: Secondary | ICD-10-CM | POA: Diagnosis not present

## 2020-11-19 LAB — LIPID PANEL
Cholesterol: 113 mg/dL (ref <200)
HDL: 41 mg/dL — ABNORMAL LOW (ref >=50)
LDL Cholesterol (Calc): 51 mg/dL (calc)
Non-HDL Cholesterol (Calc): 72 mg/dL (calc) (ref <130)
Total CHOL/HDL Ratio: 2.8 (calc) (ref <5.0)
Triglycerides: 119 mg/dL (ref <150)

## 2020-11-19 LAB — COMPLETE METABOLIC PANEL WITH GFR
AG Ratio: 1.8 (calc) (ref 1.0–2.5)
ALT: 9 U/L (ref 6–29)
AST: 15 U/L (ref 10–35)
Albumin: 4.3 g/dL (ref 3.6–5.1)
Alkaline phosphatase (APISO): 64 U/L (ref 37–153)
BUN: 15 mg/dL (ref 7–25)
CO2: 29 mmol/L (ref 20–32)
Calcium: 9.6 mg/dL (ref 8.6–10.4)
Chloride: 101 mmol/L (ref 98–110)
Creat: 0.68 mg/dL (ref 0.60–0.88)
GFR, Est African American: 93 mL/min/{1.73_m2} (ref >=60)
GFR, Est Non African American: 80 mL/min/{1.73_m2} (ref >=60)
Globulin: 2.4 g/dL (calc) (ref 1.9–3.7)
Glucose, Bld: 112 mg/dL — ABNORMAL HIGH (ref 65–99)
Potassium: 4.4 mmol/L (ref 3.5–5.3)
Sodium: 140 mmol/L (ref 135–146)
Total Bilirubin: 0.3 mg/dL (ref 0.2–1.2)
Total Protein: 6.7 g/dL (ref 6.1–8.1)

## 2020-11-21 NOTE — Progress Notes (Signed)
All labs are normal. 

## 2020-12-01 ENCOUNTER — Telehealth: Payer: Self-pay | Admitting: Neurology

## 2020-12-01 NOTE — Telephone Encounter (Signed)
Patient has been taking Lisinopril 5 mg daily.  She brought home readings to last visit and was told if she has dizziness/lightheaded feeling to call back and this may need decreased.  She has been experience symptoms daily of dizziness. This has been worse when moving suddenly light getting up from being seated or turning around when cooking.  She has been on Trulicity and losing weight. Wants to know if she needs another visit or if we can decrease meds to 2.5 mg?  Blood pressure reading this morning: 101/58. Highest it has been since visit: 130/67 Lowest: 82/43  Please advise.

## 2020-12-01 NOTE — Telephone Encounter (Signed)
Yes lets try decreasing lisinopril to 2.5 mg.  See if she is able to just split the 5 mg tabs in half if not I am more than happy to send over a new prescription have her follow-up in about 3 to 4 weeks for her blood pressures.  Just make sure to that she is hydrating

## 2020-12-02 ENCOUNTER — Other Ambulatory Visit: Payer: Self-pay | Admitting: Family Medicine

## 2020-12-02 NOTE — Telephone Encounter (Signed)
Pt advised of recommendations. Ok with splitting the tablet in half. Appointment scheduled for 12/26/20 for f/u on bp.

## 2020-12-06 ENCOUNTER — Other Ambulatory Visit: Payer: Self-pay | Admitting: Family Medicine

## 2020-12-26 ENCOUNTER — Other Ambulatory Visit: Payer: Self-pay

## 2020-12-26 ENCOUNTER — Ambulatory Visit (INDEPENDENT_AMBULATORY_CARE_PROVIDER_SITE_OTHER): Payer: Medicare HMO | Admitting: Family Medicine

## 2020-12-26 ENCOUNTER — Encounter: Payer: Self-pay | Admitting: Family Medicine

## 2020-12-26 VITALS — BP 119/61 | HR 88 | Ht 66.0 in | Wt 215.0 lb

## 2020-12-26 DIAGNOSIS — R809 Proteinuria, unspecified: Secondary | ICD-10-CM | POA: Diagnosis not present

## 2020-12-26 DIAGNOSIS — E1129 Type 2 diabetes mellitus with other diabetic kidney complication: Secondary | ICD-10-CM | POA: Diagnosis not present

## 2020-12-26 DIAGNOSIS — E118 Type 2 diabetes mellitus with unspecified complications: Secondary | ICD-10-CM

## 2020-12-26 MED ORDER — METFORMIN HCL ER 500 MG PO TB24: 1000.0000 mg | ORAL_TABLET | Freq: Every day | ORAL | 1 refills | Status: DC

## 2020-12-26 NOTE — Progress Notes (Signed)
Established Patient Office Visit  Subjective:  Patient ID: Diana Parsons, female    DOB: 11/16/1933  Age: 87 y.o. MRN: [id number]  CC: No chief complaint on file.   HPI [name] presents for follow-up blood pressure.  She is currently on a low-dose ACE inhibitor secondary to microalbuminuria.  She had actually called and said that the 5 mg lisinopril was dropping her blood pressure too much.  She actually does not have hypertension but I just wanted to make sure that it was not dropping her blood pressure too low especially since she is over 75.  She had recently been working on losing weight.  She did bring in her blood pressure log with her today.  No past medical history on file.  Past Surgical History:  Procedure Laterality Date  . ABDOMINAL HYSTERECTOMY  87 yrs old  . left shoulder sx s/p fall  2001   has pin in place  . lumbarp spine surgery  07/2014   Dr. Lurene Shadow    Family History  Problem Relation Age of Onset  . Cancer Mother        colon  . Diabetes Sister   . Diabetes Brother   . Cancer Brother        lung cancer    Social History   Socioeconomic History  . Marital status: Legally Separated    Spouse name: Not on file  . Number of children: Not on file  . Years of education: Not on file  . Highest education level: Not on file  Occupational History  . Not on file  Tobacco Use  . Smoking status: Former Smoker    Packs/day: 0.10    Years: 3.00    Pack years: 0.30  . Smokeless tobacco: Former Network engineer and Sexual Activity  . Alcohol use: No  . Drug use: No  . Sexual activity: Not on file  Other Topics Concern  . Not on file  Social History Narrative  . Not on file   Social Determinants of Health   Financial Resource Strain: Not on file  Food Insecurity: Not on file  Transportation Needs: Not on file  Physical Activity: Not on file  Stress: Not on file  Social Connections: Not on file  Intimate Partner Violence: Not on file     Outpatient Medications Prior to Visit  Medication Sig Dispense Refill  . Accu-Chek FastClix Lancets MISC TEST TWICE A DAY (Patient taking differently: TEST ONCE A DAY) 204 each 12  . aspirin 81 MG tablet Take 81 mg by mouth daily.    . Blood Glucose Monitoring Suppl (ONETOUCH VERIO) w/Device KIT USE TO TEST ONCE DAILY. DX: E11.9 1 kit PRN  . Calcium Carb-Cholecalciferol (CALCIUM 600/VITAMIN D3 PO) Take 1 tablet by mouth daily.    . famotidine (PEPCID) 20 MG tablet TAKE 1 TABLET BY MOUTH EVERYDAY AT BEDTIME 90 tablet 4  . fish oil-omega-3 fatty acids 1000 MG capsule Take 1 g by mouth daily.     . Multiple Vitamins-Minerals (PRESERVISION AREDS PO) Take 1 tablet by mouth in the morning and at bedtime. One tablet in the morning and one in the evening    . omeprazole (PRILOSEC) 40 MG capsule TAKE 1 CAPSULE BY MOUTH EVERY DAY 30 capsule 11  . ONETOUCH VERIO test strip USE TO TEST BLOOD SUGAR ONCE DAILY 100 strip 12  . rosuvastatin (CRESTOR) 20 MG tablet TAKE 1 TABLET BY MOUTH EVERY DAY 90 tablet 3  . TRULICITY 5.00  MG/0.5ML SOPN INJECT 0.5 MLS (0.75 MG TOTAL) INTO THE SKIN ONCE A WEEK. 2 mL 3  . lisinopril (ZESTRIL) 5 MG tablet TAKE 1 TABLET BY MOUTH EVERY DAY 90 tablet 1  . XIGDUO XR 02-999 MG TB24 TAKE 2 TABLETS BY MOUTH EVERY DAY IN THE MORNING 60 tablet 5   No facility-administered medications prior to visit.    No Known Allergies  ROS Review of Systems    Objective:    Physical Exam Vitals reviewed.  Constitutional:      Appearance: She is well-developed.  HENT:     Head: Normocephalic and atraumatic.  Eyes:     Conjunctiva/sclera: Conjunctivae normal.  Cardiovascular:     Rate and Rhythm: Normal rate.  Pulmonary:     Effort: Pulmonary effort is normal.  Skin:    General: Skin is dry.     Coloration: Skin is not pale.  Neurological:     Mental Status: She is alert and oriented to person, place, and time.  Psychiatric:        Behavior: Behavior normal.     BP  119/61   Pulse 88   Ht 5' 6"  (1.676 m)   Wt 215 lb (97.5 kg)   BMI 34.70 kg/m  Wt Readings from Last 3 Encounters:  12/26/20 215 lb (97.5 kg)  11/16/20 214 lb (97.1 kg)  08/01/20 223 lb (101.2 kg)     Health Maintenance Due  Topic Date Due  . COLONOSCOPY (Pts 45-74yr Insurance coverage will need to be confirmed)  05/30/2018  . URINE MICROALBUMIN  01/17/2019  . FOOT EXAM  09/30/2020    There are no preventive care reminders to display for this patient.  No results found for: TSH No results found for: WBC, HGB, HCT, MCV, PLT Lab Results  Component Value Date   NA 140 11/18/2020   K 4.4 11/18/2020   CO2 29 11/18/2020   GLUCOSE 112 (H) 11/18/2020   BUN 15 11/18/2020   CREATININE 0.68 11/18/2020   BILITOT 0.3 11/18/2020   ALKPHOS 62 06/20/2016   AST 15 11/18/2020   ALT 9 11/18/2020   PROT 6.7 11/18/2020   ALBUMIN 3.8 06/20/2016   CALCIUM 9.6 11/18/2020   Lab Results  Component Value Date   CHOL 113 11/18/2020   Lab Results  Component Value Date   HDL 41 (L) 11/18/2020   Lab Results  Component Value Date   LDLCALC 51 11/18/2020   Lab Results  Component Value Date   TRIG 119 11/18/2020   Lab Results  Component Value Date   CHOLHDL 2.8 11/18/2020   Lab Results  Component Value Date   HGBA1C 6.3 (A) 11/16/2020      Assessment & Plan:   Problem List Items Addressed This Visit      Endocrine   Microalbuminuria due to type 2 diabetes mellitus ([facility]    She didn't tolerated 2.566mf lisinopril. It was still causing hyptension.  Will stop med today.        Relevant Medications   metFORMIN (GLUCOPHAGE-XR) 500 MG 24 hr tablet   Controlled diabetes mellitus type 2 with complications ([facility]   Also discussed switching her Xigduo to plain Metformin since she is been doing really well with the Trulicity.  This would significantly improve the cost and help her not get into her Medicare gap especially since she has been doing much better with her diet  and has actually lost some weight.  The weight was stable today.  We will discontinue take Xigduo and switch to Metformin 500 mg ER, 2 tabs daily.  Keep follow-up in 2 months for diabetes.      Relevant Medications   metFORMIN (GLUCOPHAGE-XR) 500 MG 24 hr tablet      Meds ordered this encounter  Medications  . metFORMIN (GLUCOPHAGE-XR) 500 MG 24 hr tablet    Sig: Take 2 tablets (1,000 mg total) by mouth daily with breakfast.    Dispense:  90 tablet    Refill:  1    Follow-up: Return in about 2 months (around 02/25/2021) for Diabetes follow-up.    [name], MD

## 2020-12-26 NOTE — Assessment & Plan Note (Signed)
Also discussed switching her Xigduo to plain Metformin since she is been doing really well with the Trulicity.  This would significantly improve the cost and help her not get into her Medicare gap especially since she has been doing much better with her diet and has actually lost some weight.  The weight was stable today.  We will discontinue take Xigduo and switch to Metformin 500 mg ER, 2 tabs daily.  Keep follow-up in 2 months for diabetes.

## 2020-12-26 NOTE — Assessment & Plan Note (Signed)
She didn't tolerated 2.40m of lisinopril. It was still causing hyptension.  Will stop med today.

## 2021-01-27 ENCOUNTER — Other Ambulatory Visit: Payer: Self-pay | Admitting: Family Medicine

## 2021-01-27 DIAGNOSIS — E785 Hyperlipidemia, unspecified: Secondary | ICD-10-CM

## 2021-02-03 ENCOUNTER — Ambulatory Visit (INDEPENDENT_AMBULATORY_CARE_PROVIDER_SITE_OTHER): Payer: Medicare HMO | Admitting: Family Medicine

## 2021-02-03 DIAGNOSIS — Z0000 Encounter for general adult medical examination without abnormal findings: Secondary | ICD-10-CM

## 2021-02-03 DIAGNOSIS — Z1211 Encounter for screening for malignant neoplasm of colon: Secondary | ICD-10-CM | POA: Diagnosis not present

## 2021-02-03 NOTE — Progress Notes (Signed)
MEDICARE ANNUAL WELLNESS VISIT  02/03/2021  Telephone Visit Disclaimer This Medicare AWV was conducted by telephone due to national recommendations for restrictions regarding the COVID-19 Pandemic (e.g. social distancing).  I verified, using two identifiers, that I am speaking with Diana Parsons or their authorized healthcare agent. I discussed the limitations, risks, security, and privacy concerns of performing an evaluation and management service by telephone and the potential availability of an in-person appointment in the future. The patient expressed understanding and agreed to proceed.  Location of Patient: Home Location of Provider (nurse):  In the office.  Subjective:    Diana Parsons is a 87 y.o. female patient of Metheney, Rene Kocher, MD who had a Medicare Annual Wellness Visit today via telephone. Diana Parsons is Retired and lives alone. she has 1 child. she reports that she is socially active and does interact with friends/family regularly. she is minimally physically active and enjoys reading.  Patient Care Team: Hali Marry, MD as PCP - General Marlise Eves Daubert MD (Orthopedic Surgery)  Advanced Directives 02/12/2014  Does Patient Have a Medical Advance Directive? Patient has advance directive, copy not in chart  Type of Advance Directive [place];Living will    Hospital Utilization Over the Past 12 Months: # of hospitalizations or ER visits: 0 # of surgeries: 1  Review of Systems    Patient reports that her overall health is better compared to last year.  History obtained from chart review and the patient  Patient Reported Readings (BP, Pulse, CBG, Weight, etc) none  Pain Assessment Pain : No/denies pain     Current Medications & Allergies (verified) Allergies as of 02/03/2021   No Known Allergies     Medication List       Accurate as of February 03, 2021  3:54 PM. If you have any questions, ask your nurse or doctor.         Accu-Chek FastClix Lancets Misc TEST TWICE A DAY What changed: additional instructions   aspirin 81 MG tablet Take 81 mg by mouth daily.   CALCIUM 600/VITAMIN D3 PO Take 1 tablet by mouth daily.   famotidine 20 MG tablet Commonly known as: PEPCID TAKE 1 TABLET BY MOUTH EVERYDAY AT BEDTIME   fish oil-omega-3 fatty acids 1000 MG capsule Take 1 g by mouth daily.   metFORMIN 500 MG 24 hr tablet Commonly known as: GLUCOPHAGE-XR Take 2 tablets (1,000 mg total) by mouth daily with breakfast.   omeprazole 40 MG capsule Commonly known as: PRILOSEC TAKE 1 CAPSULE BY MOUTH EVERY DAY   OneTouch Verio test strip Generic drug: glucose blood USE TO TEST BLOOD SUGAR ONCE DAILY   OneTouch Verio w/Device Kit USE TO TEST ONCE DAILY. DX: E11.9   PRESERVISION AREDS PO Take 1 tablet by mouth in the morning and at bedtime. One tablet in the morning and one in the evening   rosuvastatin 20 MG tablet Commonly known as: CRESTOR TAKE 1 TABLET BY MOUTH EVERY DAY   Trulicity 8.61 UO/3.7GB Sopn Generic drug: Dulaglutide INJECT 0.5 MLS (0.75 MG TOTAL) INTO THE SKIN ONCE A WEEK.       History (reviewed): No past medical history on file. Past Surgical History:  Procedure Laterality Date  . ABDOMINAL HYSTERECTOMY  87 yrs old  . left shoulder sx s/p fall  2001   has pin in place  . lumbarp spine surgery  07/2014   Dr. Lurene Shadow   Family History  Problem Relation Age of Onset  .  Cancer Mother        colon  . Diabetes Sister   . Diabetes Brother   . Cancer Brother        lung cancer   Social History   Socioeconomic History  . Marital status: Legally Separated    Spouse name: Not on file  . Number of children: Not on file  . Years of education: Not on file  . Highest education level: Not on file  Occupational History  . Not on file  Tobacco Use  . Smoking status: Former Smoker    Packs/day: 0.10    Years: 3.00    Pack years: 0.30  . Smokeless tobacco: Former Network engineer  and Sexual Activity  . Alcohol use: No  . Drug use: No  . Sexual activity: Not on file  Other Topics Concern  . Not on file  Social History Narrative  . Not on file   Social Determinants of Health   Financial Resource Strain: Not on file  Food Insecurity: Not on file  Transportation Needs: Not on file  Physical Activity: Not on file  Stress: Not on file  Social Connections: Not on file    Activities of Daily Living No flowsheet data found.  Patient Education/ Literacy How often do you need to have someone help you when you read instructions, pamphlets, or other written materials from your doctor or pharmacy?: 1 - Never What is the last grade level you completed in school?: 12th grade and some college  Exercise    Diet Patient reports consuming 3 meals a day and 0-1 snack(s) a day Patient reports that her primary diet is: Regular Patient reports that she does have regular access to food.   Depression Screen PHQ 2/9 Scores 08/01/2020 07/01/2019 04/14/2018 01/16/2018 06/25/2017 04/20/2016 02/23/2015  PHQ - 2 Score 0 0 0 0 0 0 0     Fall Risk Fall Risk  02/03/2020 07/01/2019 04/14/2018 01/16/2018 06/25/2017  Falls in the past year? 1 0 Yes No No  Number falls in past yr: 0 0 1 - -  Injury with Fall? 1 0 Yes - -  Risk for fall due to : Other (Comment) - - - -  Risk for fall due to: Comment she slipped off of a podium and hit her knee - - - -  Follow up Falls prevention discussed - Falls prevention discussed - -  Comment advised to watch her footing - - - -     Objective:  Chauncey Cruel Ivanov seemed alert and oriented and she participated appropriately during our telephone visit.  Blood Pressure Weight BMI  BP Readings from Last 3 Encounters:  12/26/20 119/61  11/16/20 (!) 136/52  08/01/20 (!) 127/46   Wt Readings from Last 3 Encounters:  12/26/20 215 lb (97.5 kg)  11/16/20 214 lb (97.1 kg)  08/01/20 223 lb (101.2 kg)   BMI Readings from Last 1 Encounters:  12/26/20 34.70 kg/m     *Unable to obtain current vital signs, weight, and BMI due to telephone visit type  Hearing/Vision  . Lynzy did not seem to have difficulty with hearing/understanding during the telephone conversation . Reports that she has had a formal eye exam by an eye care professional within the past year . Reports that she has not had a formal hearing evaluation within the past year *Unable to fully assess hearing and vision during telephone visit type  Cognitive Function: No flowsheet data found. (Normal:0-7, Significant for Dysfunction: >8)  Normal Cognitive  Function Screening: Yes   Immunization & Health Maintenance Record Immunization History  Administered Date(s) Administered  . Fluad Quad(high Dose 65+) 07/01/2019  . Influenza Split 08/14/2011  . Influenza Whole 07/15/2006, 08/12/2007, 08/06/2008, 09/06/2009, 06/30/2010  . Influenza, High Dose Seasonal PF 07/20/2016, 06/25/2017, 07/17/2018, 07/22/2020  . Influenza, Seasonal, Injecte, Preservative Fre 09/19/2012  . Influenza,inj,Quad PF,6+ Mos 08/11/2013, 06/18/2014, 08/24/2015  . PFIZER(Purple Top)SARS-COV-2 Vaccination 12/17/2019, 01/13/2020, 07/22/2020  . Pneumococcal Conjugate-13 09/24/2014  . Pneumococcal Polysaccharide-23 07/15/2006  . Td 10/16/2003  . Tdap 11/12/2013  . Zoster 10/15/2010  . Zoster Recombinat (Shingrix) 02/20/2017    Health Maintenance  Topic Date Due  . COLONOSCOPY (Pts 45-47yr Insurance coverage will need to be confirmed)  05/30/2018  . URINE MICROALBUMIN  01/17/2019  . FOOT EXAM  09/30/2020  . INFLUENZA VACCINE  05/15/2021  . HEMOGLOBIN A1C  05/16/2021  . OPHTHALMOLOGY EXAM  05/18/2021  . TETANUS/TDAP  11/13/2023  . DEXA SCAN  Completed  . COVID-19 Vaccine  Completed  . PNA vac Low Risk Adult  Completed  . HPV VACCINES  Aged Out       Assessment  This is a routine wellness examination for [employer]  Health Maintenance: Due or Overdue Health Maintenance Due  Topic Date Due  .  COLONOSCOPY (Pts 45-463yrInsurance coverage will need to be confirmed)  05/30/2018  . URINE MICROALBUMIN  01/17/2019  . FOOT EXAM  09/30/2020    MaChauncey Crueloteet does not need a referral for Community Assistance: Care Management:   no Social Work:    no Prescription Assistance:  no Nutrition/Diabetes Education:  no   Plan:  Personalized Goals Goals Addressed   None    Personalized Health Maintenance & Screening Recommendations  Colorectal cancer screening Foot exam, Urine Microalbumin- will be done at the next in office visit  Shingrix (2nd dose)- patient stated that it will cost $500 out of pocket for the second vaccine. She declined at this time.  Lung Cancer Screening Recommended: no (Low Dose CT Chest recommended if Age 85-80ears, 30 pack-year currently smoking OR have quit w/in past 15 years) Hepatitis C Screening recommended: no HIV Screening recommended: no  Advanced Directives: Written information was not prepared per patient's request.  Referrals & Orders No orders of the defined types were placed in this encounter.   Follow-up Plan . Follow-up with MeHali MarryMD as planned . Colonoscopy referral has been sent.  . Medicare wellness in one year. . Foot exam and Urine Microalbumin at your next in-office visit.   I have personally reviewed and noted the following in the patient's chart:   . Medical and social history . Use of alcohol, tobacco or illicit drugs  . Current medications and supplements . Functional ability and status . Nutritional status . Physical activity . Advanced directives . List of other physicians . Hospitalizations, surgeries, and ER visits in previous 12 months . Vitals . Screenings to include cognitive, depression, and falls . Referrals and appointments  In addition, I have reviewed and discussed with MaChauncey Crueloteet certain preventive protocols, quality metrics, and best practice recommendations. A written personalized care  plan for preventive services as well as general preventive health recommendations is available and can be mailed to the patient at her request.      BaTinnie GensRN  02/03/2021

## 2021-02-03 NOTE — Patient Instructions (Addendum)
[place] Maintenance Summary and Written Plan of Care  Diana Parsons ,  Thank you for allowing me to perform your Medicare Annual Wellness Visit and for your ongoing commitment to your health.   Health Maintenance & Immunization History Health Maintenance  Topic Date Due  . COLONOSCOPY (Pts 45-42yrs Insurance coverage will need to be confirmed)  02/12/2021 (Originally 05/30/2018)  . URINE MICROALBUMIN  03/05/2021 (Originally 01/17/2019)  . FOOT EXAM  02/03/2022 (Originally 09/30/2020)  . INFLUENZA VACCINE  05/15/2021  . HEMOGLOBIN A1C  05/16/2021  . OPHTHALMOLOGY EXAM  05/18/2021  . TETANUS/TDAP  11/13/2023  . DEXA SCAN  Completed  . COVID-19 Vaccine  Completed  . PNA vac Low Risk Adult  Completed  . HPV VACCINES  Aged Out   Immunization History  Administered Date(s) Administered  . Fluad Quad(high Dose 65+) 07/01/2019  . Influenza Split 08/14/2011  . Influenza Whole 07/15/2006, 08/12/2007, 08/06/2008, 09/06/2009, 06/30/2010  . Influenza, High Dose Seasonal PF 07/20/2016, 06/25/2017, 07/17/2018, 07/22/2020  . Influenza, Seasonal, Injecte, Preservative Fre 09/19/2012  . Influenza,inj,Quad PF,6+ Mos 08/11/2013, 06/18/2014, 08/24/2015  . PFIZER(Purple Top)SARS-COV-2 Vaccination 12/17/2019, 01/13/2020, 07/22/2020  . Pneumococcal Conjugate-13 09/24/2014  . Pneumococcal Polysaccharide-23 07/15/2006  . Td 10/16/2003  . Tdap 11/12/2013  . Zoster 10/15/2010  . Zoster Recombinat (Shingrix) 02/20/2017    These are the patient goals that we discussed: Goals Addressed              This Visit's Progress   .  Patient Stated (pt-stated)        02/03/2021 AWV Goal: Diabetes Management  . Patient will maintain an A1C level below 8.0 . Patient will not develop any diabetic foot complications . Patient will not experience any hypoglycemic episodes over the next 3 months . Patient will notify our office of any CBG readings outside of the provider recommended  range by calling [phone] . Patient will adhere to provider recommendations for diabetes management  Patient Self Management Activities . take all medications as prescribed and report any negative side effects . monitor and record blood sugar readings as directed . adhere to a low carbohydrate diet that incorporates lean proteins, vegetables, whole grains, low glycemic fruits . check feet daily noting any sores, cracks, injuries, or callous formations . see PCP or podiatrist if she notices any changes in her legs, feet, or toenails . Patient will visit PCP and have an A1C level checked every 3 to 6 months as directed  . have a yearly eye exam to monitor for vascular changes associated with diabetes and will request that the report be sent to her pcp.  . consult with her PCP regarding any changes in her health or new or worsening symptoms         This is a list of Health Maintenance Items that are overdue or due now: Colorectal cancer screening Foot exam, Urine Microalbumin- will be done at the next in office visit  Shingrix (2nd dose)- patient stated that it will cost $500 out of pocket for the second vaccine. She declined at this time.  Orders/Referrals Placed Today: Orders Placed This Encounter  Procedures  . Ambulatory referral to Gastroenterology (for Colonoscopy)    Referral Priority:   Routine    Referral Type:   Consultation    Referral Reason:   Specialty Services Required    Number of Visits Requested:   1   (Contact our referral department at [phone] if you have not spoken with someone about your referral  appointment within the next 5 days)    Follow-up Plan . Follow-up with Hali Marry, MD as planned . Colonoscopy referral has been sent.  . Medicare wellness in one year. . Foot exam and Urine Microalbumin at your next in-office visit       Colonoscopy, Adult A colonoscopy is a procedure to look at the entire large intestine. This procedure is  done using a long, thin, flexible tube that has a camera on the end. You may have a colonoscopy:  As a part of normal colorectal screening.  If you have certain symptoms, such as: ? A low number of red blood cells in your blood (anemia). ? Diarrhea that does not go away. ? Pain in your abdomen. ? Blood in your stool. A colonoscopy can help screen for and diagnose medical problems, including:  Tumors.  Extra tissue that grows where mucus forms (polyps).  Inflammation.  Areas of bleeding. Tell your health care provider about:  Any allergies you have.  All medicines you are taking, including vitamins, herbs, eye drops, creams, and over-the-counter medicines.  Any problems you or family members have had with anesthetic medicines.  Any blood disorders you have.  Any surgeries you have had.  Any medical conditions you have.  Any problems you have had with having bowel movements.  Whether you are pregnant or may be pregnant. What are the risks? Generally, this is a safe procedure. However, problems may occur, including:  Bleeding.  Damage to your intestine.  Allergic reactions to medicines given during the procedure.  Infection. This is rare. What happens before the procedure? Eating and drinking restrictions Follow instructions from your health care provider about eating or drinking restrictions, which may include:  A few days before the procedure: ? Follow a low-fiber diet. ? Avoid nuts, seeds, dried fruit, raw fruits, and vegetables.  1-3 days before the procedure: ? Eat only gelatin dessert or ice pops. ? Drink only clear liquids, such as water, clear juice, clear broth or bouillon, black coffee or tea, or clear soft drinks or sports drinks. ? Avoid liquids that contain red or purple dye.  The day of the procedure: ? Do not eat solid foods. You may continue to drink clear liquids until up to 2 hours before the procedure. ? Do not eat or drink anything  starting 2 hours before the procedure, or within the time period that your health care provider recommends. Bowel prep If you were prescribed a bowel prep to take by mouth (orally) to clean out your colon:  Take it as told by your health care provider. Starting the day before your procedure, you will need to drink a large amount of liquid medicine. The liquid will cause you to have many bowel movements of loose stool until your stool becomes almost clear or light green.  If your skin or the opening between the buttocks (anus) gets irritated from diarrhea, you may relieve the irritation using: ? Wipes with medicine in them, such as adult wet wipes with aloe and vitamin E. ? A product to soothe skin, such as petroleum jelly.  If you vomit while drinking the bowel prep: ? Take a break for up to 60 minutes. ? Begin the bowel prep again. ? Call your health care provider if you keep vomiting or you cannot take the bowel prep without vomiting.  To clean out your colon, you may also be given: ? Laxative medicines. These help you have a bowel movement. ? Instructions for enema  use. An enema is liquid medicine injected into your rectum. Medicines Ask your health care provider about:  Changing or stopping your regular medicines or supplements. This is especially important if you are taking iron supplements, diabetes medicines, or blood thinners.  Taking medicines such as aspirin and ibuprofen. These medicines can thin your blood. Do not take these medicines unless your health care provider tells you to take them.  Taking over-the-counter medicines, vitamins, herbs, and supplements. General instructions  Ask your health care provider what steps will be taken to help prevent infection. These may include washing skin with a germ-killing soap.  Plan to have someone take you home from the hospital or clinic. What happens during the procedure?  An IV will be inserted into one of your veins.  You  may be given one or more of the following: ? A medicine to help you relax (sedative). ? A medicine to numb the area (local anesthetic). ? A medicine to make you fall asleep (general anesthetic). This is rarely needed.  You will lie on your side with your knees bent.  The tube will: ? Have oil or gel put on it (be lubricated). ? Be inserted into your anus. ? Be gently eased through all parts of your large intestine.  Air will be sent into your colon to keep it open. This may cause some pressure or cramping.  Images will be taken with the camera and will appear on a screen.  A small tissue sample may be removed to be looked at under a microscope (biopsy). The tissue may be sent to a lab for testing if any signs of problems are found.  If small polyps are found, they may be removed and checked for cancer cells.  When the procedure is finished, the tube will be removed. The procedure may vary among health care providers and hospitals.   What happens after the procedure?  Your blood pressure, heart rate, breathing rate, and blood oxygen level will be monitored until you leave the hospital or clinic.  You may have a small amount of blood in your stool.  You may pass gas and have mild cramping or bloating in your abdomen. This is caused by the air that was used to open your colon during the exam.  Do not drive for 24 hours after the procedure.  It is up to you to get the results of your procedure. Ask your health care provider, or the department that is doing the procedure, when your results will be ready. Summary  A colonoscopy is a procedure to look at the entire large intestine.  Follow instructions from your health care provider about eating and drinking before the procedure.  If you were prescribed an oral bowel prep to clean out your colon, take it as told by your health care provider.  During the colonoscopy, a flexible tube with a camera on its end is inserted into the anus  and then passed into the other parts of the large intestine. This information is not intended to replace advice given to you by your health care provider. Make sure you discuss any questions you have with your health care provider. Document Revised: 04/24/2019 Document Reviewed: 04/24/2019 Elsevier Patient Education  [place] Maintenance, Female Adopting a healthy lifestyle and getting preventive care are important in promoting health and wellness. Ask your health care provider about:  The right schedule for you to have regular tests and exams.  Things you can do  on your own to prevent diseases and keep yourself healthy. What should I know about diet, weight, and exercise? Eat a healthy diet  Eat a diet that includes plenty of vegetables, fruits, low-fat dairy products, and lean protein.  Do not eat a lot of foods that are high in solid fats, added sugars, or sodium.   Maintain a healthy weight Body mass index (BMI) is used to identify weight problems. It estimates body fat based on height and weight. Your health care provider can help determine your BMI and help you achieve or maintain a healthy weight. Get regular exercise Get regular exercise. This is one of the most important things you can do for your health. Most adults should:  Exercise for at least 150 minutes each week. The exercise should increase your heart rate and make you sweat (moderate-intensity exercise).  Do strengthening exercises at least twice a week. This is in addition to the moderate-intensity exercise.  Spend less time sitting. Even light physical activity can be beneficial. Watch cholesterol and blood lipids Have your blood tested for lipids and cholesterol at 85 years of age, then have this test every 5 years. Have your cholesterol levels checked more often if:  Your lipid or cholesterol levels are high.  You are older than 85 years of age.  You are at high risk for heart  disease. What should I know about cancer screening? Depending on your health history and family history, you may need to have cancer screening at various ages. This may include screening for:  Breast cancer.  Cervical cancer.  Colorectal cancer.  Skin cancer.  Lung cancer. What should I know about heart disease, diabetes, and high blood pressure? Blood pressure and heart disease  High blood pressure causes heart disease and increases the risk of stroke. This is more likely to develop in people who have high blood pressure readings, are of African descent, or are overweight.  Have your blood pressure checked: ? Every 3-5 years if you are 36-80 years of age. ? Every year if you are 76 years old or older. Diabetes Have regular diabetes screenings. This checks your fasting blood sugar level. Have the screening done:  Once every three years after age 59 if you are at a normal weight and have a low risk for diabetes.  More often and at a younger age if you are overweight or have a high risk for diabetes. What should I know about preventing infection? Hepatitis B If you have a higher risk for hepatitis B, you should be screened for this virus. Talk with your health care provider to find out if you are at risk for hepatitis B infection. Hepatitis C Testing is recommended for:  Everyone born from [birth year] through 1963.  Anyone with known risk factors for hepatitis C. Sexually transmitted infections (STIs)  Get screened for STIs, including gonorrhea and chlamydia, if: ? You are sexually active and are younger than 85 years of age. ? You are older than 85 years of age and your health care provider tells you that you are at risk for this type of infection. ? Your sexual activity has changed since you were last screened, and you are at increased risk for chlamydia or gonorrhea. Ask your health care provider if you are at risk.  Ask your health care provider about whether you are at high  risk for HIV. Your health care provider may recommend a prescription medicine to help prevent HIV infection. If you choose to take medicine  to prevent HIV, you should first get tested for HIV. You should then be tested every 3 months for as long as you are taking the medicine. Pregnancy  If you are about to stop having your period (premenopausal) and you may become pregnant, seek counseling before you get pregnant.  Take 400 to 800 micrograms (mcg) of folic acid every day if you become pregnant.  Ask for birth control (contraception) if you want to prevent pregnancy. Osteoporosis and menopause Osteoporosis is a disease in which the bones lose minerals and strength with aging. This can result in bone fractures. If you are 77 years old or older, or if you are at risk for osteoporosis and fractures, ask your health care provider if you should:  Be screened for bone loss.  Take a calcium or vitamin D supplement to lower your risk of fractures.  Be given hormone replacement therapy (HRT) to treat symptoms of menopause. Follow these instructions at home: Lifestyle  Do not use any products that contain nicotine or tobacco, such as cigarettes, e-cigarettes, and chewing tobacco. If you need help quitting, ask your health care provider.  Do not use street drugs.  Do not share needles.  Ask your health care provider for help if you need support or information about quitting drugs. Alcohol use  Do not drink alcohol if: ? Your health care provider tells you not to drink. ? You are pregnant, may be pregnant, or are planning to become pregnant.  If you drink alcohol: ? Limit how much you use to 0-1 drink a day. ? Limit intake if you are breastfeeding.  Be aware of how much alcohol is in your drink. In the [place] one drink equals one 12 oz bottle of beer (355 mL), one 5 oz glass of wine (148 mL), or one 1 oz glass of hard liquor (44 mL). General instructions  Schedule regular health, dental,  and eye exams.  Stay current with your vaccines.  Tell your health care provider if: ? You often feel depressed. ? You have ever been abused or do not feel safe at home. Summary  Adopting a healthy lifestyle and getting preventive care are important in promoting health and wellness.  Follow your health care provider's instructions about healthy diet, exercising, and getting tested or screened for diseases.  Follow your health care provider's instructions on monitoring your cholesterol and blood pressure. This information is not intended to replace advice given to you by your health care provider. Make sure you discuss any questions you have with your health care provider. Document Revised: 09/24/2018 Document Reviewed: 09/24/2018 Elsevier Patient Education  2021 [employer].

## 2021-02-13 ENCOUNTER — Ambulatory Visit: Payer: Medicare HMO | Admitting: Family Medicine

## 2021-02-26 ENCOUNTER — Other Ambulatory Visit: Payer: Self-pay | Admitting: Family Medicine

## 2021-02-26 DIAGNOSIS — K209 Esophagitis, unspecified without bleeding: Secondary | ICD-10-CM

## 2021-02-27 ENCOUNTER — Encounter: Payer: Self-pay | Admitting: Family Medicine

## 2021-02-27 ENCOUNTER — Other Ambulatory Visit: Payer: Self-pay | Admitting: Family Medicine

## 2021-02-27 ENCOUNTER — Ambulatory Visit (INDEPENDENT_AMBULATORY_CARE_PROVIDER_SITE_OTHER): Payer: Medicare HMO | Admitting: Family Medicine

## 2021-02-27 ENCOUNTER — Other Ambulatory Visit: Payer: Self-pay

## 2021-02-27 VITALS — BP 131/53 | HR 85 | Ht 66.0 in | Wt 219.0 lb

## 2021-02-27 DIAGNOSIS — K5900 Constipation, unspecified: Secondary | ICD-10-CM

## 2021-02-27 DIAGNOSIS — R809 Proteinuria, unspecified: Secondary | ICD-10-CM | POA: Diagnosis not present

## 2021-02-27 DIAGNOSIS — E1129 Type 2 diabetes mellitus with other diabetic kidney complication: Secondary | ICD-10-CM | POA: Diagnosis not present

## 2021-02-27 DIAGNOSIS — E118 Type 2 diabetes mellitus with unspecified complications: Secondary | ICD-10-CM

## 2021-02-27 LAB — POCT GLYCOSYLATED HEMOGLOBIN (HGB A1C): HbA1c, POC (controlled diabetic range): 6.6 % (ref 0.0–7.0)

## 2021-02-27 LAB — POCT UA - MICROALBUMIN
Albumin/Creatinine Ratio, Urine, POC: <30
Creatinine, POC: 100 mg/dL
Microalbumin Ur, POC: 10 mg/L

## 2021-02-27 MED ORDER — TRULICITY 1.5 MG/0.5ML ~~LOC~~ SOAJ: 1.5000 mg | SUBCUTANEOUS | 3 refills | Status: DC

## 2021-02-27 NOTE — Progress Notes (Signed)
Established Patient Office Visit  Subjective:  Patient ID: Diana Parsons, female    DOB: 1934/07/18  Age: 87 y.o. MRN: [id number]  CC:  Chief Complaint  Patient presents with  . Diabetes  . Hypertension    HPI Diana Parsons presents for   Diabetes - no hypoglycemic events. No wounds or sores that are not healing well. No increased thirst or urination. Checking glucose at home. Taking medications as prescribed without any side effects.  She does like her blood sugars have not been as well controlled and switching from Xigduo to plain metformin though she has been doing well with the Trulicity.  Though she does feel like her appetite has started to ramp back up.  Is still struggling with chronic constipation she says she has to strain to have a bowel movement.  She says when she finally does pass that it soft but is just a very small amount.  She says she takes 2 laxatives at bedtime at night as well as a stool softener daily.  She is worried about the potential for hemorrhoids or colon cancer and would like to see GI.   No past medical history on file.  Past Surgical History:  Procedure Laterality Date  . ABDOMINAL HYSTERECTOMY  87 yrs old  . left shoulder sx s/p fall  2001   has pin in place  . lumbarp spine surgery  07/2014   Dr. Lurene Shadow    Family History  Problem Relation Age of Onset  . Cancer Mother        colon  . Diabetes Sister   . Diabetes Brother   . Cancer Brother        lung cancer    Social History   Socioeconomic History  . Marital status: Legally Separated    Spouse name: Not on file  . Number of children: 1  . Years of education: 59  . Highest education level: Some college, no degree  Occupational History    Comment: Retired  Tobacco Use  . Smoking status: Former Smoker    Packs/day: 0.10    Years: 3.00    Pack years: 0.30  . Smokeless tobacco: Former Network engineer and Sexual Activity  . Alcohol use: No  . Drug use: No  . Sexual  activity: Not on file  Other Topics Concern  . Not on file  Social History Narrative   Lives alone. She has a son; who lives close by. She likes to read and belongs to book club.   Social Determinants of Health   Financial Resource Strain: Low Risk   . Difficulty of Paying Living Expenses: Not hard at all  Food Insecurity: No Food Insecurity  . Worried About Charity fundraiser in the Last Year: Never true  . Ran Out of Food in the Last Year: Never true  Transportation Needs: No Transportation Needs  . Lack of Transportation (Medical): No  . Lack of Transportation (Non-Medical): No  Physical Activity: Inactive  . Days of Exercise per Week: 0 days  . Minutes of Exercise per Session: 0 min  Stress: No Stress Concern Present  . Feeling of Stress : Not at all  Social Connections: Moderately Integrated  . Frequency of Communication with Friends and Family: More than three times a week  . Frequency of Social Gatherings with Friends and Family: More than three times a week  . Attends Religious Services: More than 4 times per year  . Active Member of Clubs  or Organizations: Yes  . Attends Archivist Meetings: More than 4 times per year  . Marital Status: Separated  Intimate Partner Violence: Not At Risk  . Fear of Current or Ex-Partner: No  . Emotionally Abused: No  . Physically Abused: No  . Sexually Abused: No    Outpatient Medications Prior to Visit  Medication Sig Dispense Refill  . Accu-Chek FastClix Lancets MISC TEST TWICE A DAY (Patient taking differently: TEST ONCE A DAY) 204 each 12  . aspirin 81 MG tablet Take 81 mg by mouth daily.    . Blood Glucose Monitoring Suppl (ONETOUCH VERIO) w/Device KIT USE TO TEST ONCE DAILY. DX: E11.9 1 kit PRN  . Calcium Carb-Cholecalciferol (CALCIUM 600/VITAMIN D3 PO) Take 1 tablet by mouth daily.    . fish oil-omega-3 fatty acids 1000 MG capsule Take 1 g by mouth daily.    . metFORMIN (GLUCOPHAGE-XR) 500 MG 24 hr tablet Take 2  tablets (1,000 mg total) by mouth daily with breakfast. 90 tablet 1  . Multiple Vitamins-Minerals (PRESERVISION AREDS PO) Take 1 tablet by mouth in the morning and at bedtime. One tablet in the morning and one in the evening    . omeprazole (PRILOSEC) 40 MG capsule TAKE 1 CAPSULE BY MOUTH EVERY DAY 90 capsule 3  . ONETOUCH VERIO test strip USE TO TEST BLOOD SUGAR ONCE DAILY 100 strip 12  . rosuvastatin (CRESTOR) 20 MG tablet TAKE 1 TABLET BY MOUTH EVERY DAY 90 tablet 3  . TRULICITY 7.16 RC/7.8LF SOPN INJECT 0.5 MLS (0.75 MG TOTAL) INTO THE SKIN ONCE A WEEK. 2 mL 3  . famotidine (PEPCID) 20 MG tablet TAKE 1 TABLET BY MOUTH EVERYDAY AT BEDTIME 90 tablet 4   No facility-administered medications prior to visit.    No Known Allergies  ROS Review of Systems    Objective:    Physical Exam Constitutional:      Appearance: She is well-developed.  HENT:     Head: Normocephalic and atraumatic.  Cardiovascular:     Rate and Rhythm: Normal rate and regular rhythm.     Heart sounds: Normal heart sounds.  Pulmonary:     Effort: Pulmonary effort is normal.     Breath sounds: Normal breath sounds.  Skin:    General: Skin is warm and dry.  Neurological:     Mental Status: She is alert and oriented to person, place, and time.  Psychiatric:        Behavior: Behavior normal.     BP (!) 131/53   Pulse 85   Ht _0  (1.676 m)   Wt 219 lb (99.3 kg)   SpO2 97%   BMI 35.35 kg/m  Wt Readings from Last 3 Encounters:  02/27/21 219 lb (99.3 kg)  12/26/20 215 lb (97.5 kg)  11/16/20 214 lb (97.1 kg)     Health Maintenance Due  Topic Date Due  . COLONOSCOPY (Pts 45-30yr Insurance coverage will need to be confirmed)  05/30/2018    There are no preventive care reminders to display for this patient.  No results found for: TSH No results found for: WBC, HGB, HCT, MCV, PLT Lab Results  Component Value Date   NA 140 11/18/2020   K 4.4 11/18/2020   CO2 29 11/18/2020   GLUCOSE 112 (H)  11/18/2020   BUN 15 11/18/2020   CREATININE 0.68 11/18/2020   BILITOT 0.3 11/18/2020   ALKPHOS 62 06/20/2016   AST 15 11/18/2020   ALT 9 11/18/2020   PROT  6.7 11/18/2020   ALBUMIN 3.8 06/20/2016   CALCIUM 9.6 11/18/2020   Lab Results  Component Value Date   CHOL 113 11/18/2020   Lab Results  Component Value Date   HDL 41 (L) 11/18/2020   Lab Results  Component Value Date   LDLCALC 51 11/18/2020   Lab Results  Component Value Date   TRIG 119 11/18/2020   Lab Results  Component Value Date   CHOLHDL 2.8 11/18/2020   Lab Results  Component Value Date   HGBA1C 6.6 02/27/2021      Assessment & Plan:   Problem List Items Addressed This Visit      Endocrine   Microalbuminuria due to type 2 diabetes mellitus ([place])    Fortunately she was not tolerant even 2.5 mg of lisinopril and kept dropping her blood pressure.  Though at that time she was on Xigduo so this was likely contributing.      Relevant Medications   Dulaglutide (TRULICITY) 1.5 JP/2.1KK SOPN   Controlled diabetes mellitus type 2 with complications ([place]) - Primary    Discussed t options.  1C looks great today at 6.6.  I knew that was going up slightly because we had switched her Xigduo to plain metformin but she is really doing great just encouraged her to continue to work on [employer].  She admits that she has had a little bump in her appetite lately.  We will increase Trulicity to 1.5 mg follow-up in 3 months.      Relevant Medications   Dulaglutide (TRULICITY) 1.5 OE/6.9FQ SOPN   Other Relevant Orders   POCT glycosylated hemoglobin (Hb A1C) (Completed)   POCT UA - Microalbumin (Completed)    Other Visit Diagnoses    Constipation, unspecified constipation type       Relevant Orders   Ambulatory referral to Gastroenterology      Meds ordered this encounter  Medications  . Dulaglutide (TRULICITY) 1.5 HK/2.5JD SOPN    Sig: Inject 1.5 mg into the skin once a week.    Dispense:  2 mL     Refill:  3    Follow-up: Return in about 14 weeks (around 06/05/2021) for Diabetes follow-up.    [name], MD

## 2021-02-27 NOTE — Assessment & Plan Note (Signed)
Fortunately she was not tolerant even 2.5 mg of lisinopril and kept dropping her blood pressure.  Though at that time she was on Xigduo so this was likely contributing.

## 2021-02-27 NOTE — Assessment & Plan Note (Signed)
Discussed t options.  1C looks great today at 6.6.  I knew that was going up slightly because we had switched her Xigduo to plain metformin but she is really doing great just encouraged her to continue to work on [employer].  She admits that she has had a little bump in her appetite lately.  We will increase Trulicity to 1.5 mg follow-up in 3 months.

## 2021-02-27 NOTE — Patient Instructions (Signed)
Now that you are off the Xigduo which can lower blood pressure I would like for you to go back and try the 2.5 mg of lisinopril again if possible.  And let me know how it goes.

## 2021-03-06 ENCOUNTER — Encounter: Payer: Self-pay | Admitting: Physician Assistant

## 2021-03-06 ENCOUNTER — Ambulatory Visit (INDEPENDENT_AMBULATORY_CARE_PROVIDER_SITE_OTHER): Payer: Medicare HMO | Admitting: Physician Assistant

## 2021-03-06 ENCOUNTER — Other Ambulatory Visit: Payer: Self-pay

## 2021-03-06 ENCOUNTER — Ambulatory Visit (INDEPENDENT_AMBULATORY_CARE_PROVIDER_SITE_OTHER): Payer: Medicare HMO

## 2021-03-06 VITALS — BP 145/48 | HR 90 | Ht 66.0 in | Wt 220.0 lb

## 2021-03-06 DIAGNOSIS — M79674 Pain in right toe(s): Secondary | ICD-10-CM | POA: Insufficient documentation

## 2021-03-06 DIAGNOSIS — R600 Localized edema: Secondary | ICD-10-CM

## 2021-03-06 DIAGNOSIS — M19071 Primary osteoarthritis, right ankle and foot: Secondary | ICD-10-CM | POA: Diagnosis not present

## 2021-03-06 MED ORDER — PREDNISONE 50 MG PO TABS: ORAL_TABLET | ORAL | 0 refills | Status: DC

## 2021-03-06 NOTE — Patient Instructions (Addendum)
Tylenol 1000mg  up to 4 times if needed for pain.  Start prednisone.   Gout    Gout is painful swelling of your joints. Gout is a type of arthritis. It is caused by having too much uric acid in your body. Uric acid is a chemical that is made when your body breaks down substances called purines. If your body has too much uric acid, sharp crystals can form and build up in your joints. This causes pain and swelling. Gout attacks can happen quickly and be very painful (acute gout). Over time, the attacks can affect more joints and happen more often (chronic gout). What are the causes?  Too much uric acid in your blood. This can happen because: ? Your kidneys do not remove enough uric acid from your blood. ? Your body makes too much uric acid. ? You eat too many foods that are high in purines. These foods include organ meats, some seafood, and beer.  Trauma or stress. What increases the risk?  Having a family history of gout.  Being female and middle-aged.  Being female and having gone through menopause.  Being very overweight (obese).  Drinking alcohol, especially beer.  Not having enough water in the body (being dehydrated).  Losing weight too quickly.  Having an organ transplant.  Having lead poisoning.  Taking certain medicines.  Having kidney disease.  Having a skin condition called psoriasis. What are the signs or symptoms? An attack of acute gout usually happens in just one joint. The most common place is the big toe. Attacks often start at night. Other joints that may be affected include joints of the feet, ankle, knee, fingers, wrist, or elbow. Symptoms of an attack may include:  Very bad pain.  Warmth.  Swelling.  Stiffness.  Shiny, red, or purple skin.  Tenderness. The affected joint may be very painful to touch.  Chills and fever. Chronic gout may cause symptoms more often. More joints may be involved. You may also have white or yellow lumps (tophi) on  your hands or feet or in other areas near your joints.   How is this treated?  Treatment for this condition has two phases: treating an acute attack and preventing future attacks.  Acute gout treatment may include: ? NSAIDs. ? Steroids. These are taken by mouth or injected into a joint. ? Colchicine. This medicine relieves pain and swelling. It can be given by mouth or through an IV tube.  Preventive treatment may include: ? Taking small doses of NSAIDs or colchicine daily. ? Using a medicine that reduces uric acid levels in your blood. ? Making changes to your diet. You may need to see a food expert (dietitian) about what to eat and drink to prevent gout. Follow these instructions at home: During a gout attack  If told, put ice on the painful area: ? Put ice in a plastic bag. ? Place a towel between your skin and the bag. ? Leave the ice on for 20 minutes, 2-3 times a day.  Raise (elevate) the painful joint above the level of your heart as often as you can.  Rest the joint as much as possible. If the joint is in your leg, you may be given crutches.  Follow instructions from your doctor about what you cannot eat or drink.   Avoiding future gout attacks  Eat a low-purine diet. Avoid foods and drinks such as: ? Liver. ? Kidney. ? Anchovies. ? Asparagus. ? Herring. ? Mushrooms. ? Mussels. ? Beer.  Stay at a healthy weight. If you want to lose weight, talk with your doctor. Do not lose weight too fast.  Start or continue an exercise plan as told by your doctor. Eating and drinking  Drink enough fluids to keep your pee (urine) pale yellow.  If you drink alcohol: ? Limit how much you use to:  0-1 drink a day for women.  0-2 drinks a day for men. ? Be aware of how much alcohol is in your drink. In the [place] one drink equals one 12 oz bottle of beer (355 mL), one 5 oz glass of wine (148 mL), or one 1 oz glass of hard liquor (44 mL). General instructions  Take  over-the-counter and prescription medicines only as told by your doctor.  Do not drive or use heavy machinery while taking prescription pain medicine.  Return to your normal activities as told by your doctor. Ask your doctor what activities are safe for you.  Keep all follow-up visits as told by your doctor. This is important. Contact a doctor if:  You have another gout attack.  You still have symptoms of a gout attack after 10 days of treatment.  You have problems (side effects) because of your medicines.  You have chills or a fever.  You have burning pain when you pee (urinate).  You have pain in your lower back or belly. Get help right away if:  You have very bad pain.  Your pain cannot be controlled.  You cannot pee. Summary  Gout is painful swelling of the joints.  The most common site of pain is the big toe, but it can affect other joints.  Medicines and avoiding some foods can help to prevent and treat gout attacks. This information is not intended to replace advice given to you by your health care provider. Make sure you discuss any questions you have with your health care provider. Document Revised: 04/23/2018 Document Reviewed: 04/23/2018 Elsevier Patient Education  [place].

## 2021-03-06 NOTE — Progress Notes (Signed)
Subjective:    Patient ID: Diana Parsons, female    DOB: 01/20/1934, 87 y.o.   MRN: [id number]  HPI  Pt is a 87 yo female with T2DM, lumbar DDD who presents to the clinic with sudden right great toe pain and right foot swelling. Symptoms started yesterday and last night she could not sleep due to pain. She took 2 ibuprofen and helped her sleep. Denies any injury. No history of gout. She has eaten chicken for a few days but no fish or other meat. She does not drink alcohol. Friday night she took her first increased dose of trulicity. She was recently stopped on xigduo. She rates pain 8/10. It has gotten a little better today with rest, elevation and ice.  .. Active Ambulatory Problems    Diagnosis Date Noted  . Controlled diabetes mellitus type 2 with complications ([place]) 25/36/6440  . HYPERCHOLESTEROLEMIA, PURE 02/03/2007  . BREAST MASS, LEFT 02/03/2007  . POSTMENOPAUSAL STATUS 02/03/2007  . Osteoarthritis of left knee 05/28/2012  . Lumbar degenerative disc disease 05/03/2014  . Microalbuminuria due to type 2 diabetes mellitus ([place]) 07/20/2016  . Tear of meniscus of knee 05/29/2013  . Status post lumbar surgery 09/03/2013  . Spinal stenosis of lumbar region without neurogenic claudication 08/30/2013  . BMI 34.0-34.9,adult 07/03/2019  . Family history of lung cancer 11/09/2019  . Orthopnea 11/09/2019  . PND (paroxysmal nocturnal dyspnea) 11/09/2019  . Esophagitis 11/09/2019  . Aortic valve sclerosis 11/23/2019  . Combined forms of age-related cataract of left eye 06/02/2020  . Edema of right foot 03/06/2021  . Great toe pain, right 03/06/2021   Resolved Ambulatory Problems    Diagnosis Date Noted  . URI 06/30/2010  . Onychia and paronychia of finger 03/25/2008  . SKIN RASH 02/07/2009  . ABNORMAL FINDINGS, ELEVATED BP W/O HTN 02/03/2007  . Obesity (BMI 30-39.9) 08/13/2014  . Pneumonia of left lower lobe due to infectious organism 11/09/2019   No Additional Past Medical History      Review of Systems See HPI.     Objective:   Physical Exam Vitals reviewed.  Constitutional:      Appearance: Normal appearance. She is obese.  HENT:     Head: Normocephalic.  Cardiovascular:     Rate and Rhythm: Normal rate.  Pulmonary:     Effort: Pulmonary effort is normal.  Musculoskeletal:     Comments: Walking with a cane.  Pressure with palpation of right great toe.  Right great toe swollen and slightly red and warm to touch.  Non pitting edema in right foot and into right lateral ankle.  2+pedal pulses.   Neurological:     General: No focal deficit present.     Mental Status: She is alert and oriented to person, place, and time.  Psychiatric:        Mood and Affect: Mood normal.           Assessment & Plan:  Marland KitchenMarland KitchenAnira was seen today for foot pain and foot swelling.  Diagnoses and all orders for this visit:  Great toe pain, right -     BASIC METABOLIC PANEL WITH GFR -     Uric acid -     DG Toe Great Right; Future  Edema of right foot -     BASIC METABOLIC PANEL WITH GFR -     Uric acid -     DG Toe Great Right; Future  Other orders -     predniSONE (DELTASONE) 50 MG tablet;  Take one tablet daily for 5 days.   Concern for gout. No diet changes. She did just increase her trulicity to 1.5mg  and stop xigduo. No clear link to this medication and gout but was the only change.  Pain was just over the great toe. No injury. Very painful to touch.  Xray ordered today. Uric acid levels and bmp ordered.  Added prednisone for 5 days.  Tylenol 1000mg  as needed up to 4000mg  daily for pain.  Follow up as needed or if pain or symptoms worsening.  Follow up with PcP as needed.

## 2021-03-07 LAB — BASIC METABOLIC PANEL WITH GFR
BUN: 17 mg/dL (ref 7–25)
CO2: 28 mmol/L (ref 20–32)
Calcium: 9.5 mg/dL (ref 8.6–10.4)
Chloride: 102 mmol/L (ref 98–110)
Creat: 0.77 mg/dL (ref 0.60–0.88)
GFR, Est African American: 82 mL/min/{1.73_m2} (ref >=60)
GFR, Est Non African American: 71 mL/min/{1.73_m2} (ref >=60)
Glucose, Bld: 88 mg/dL (ref 65–139)
Potassium: 4.4 mmol/L (ref 3.5–5.3)
Sodium: 139 mmol/L (ref 135–146)

## 2021-03-07 LAB — URIC ACID: Uric Acid, Serum: 3.2 mg/dL (ref 2.5–7.0)

## 2021-03-07 NOTE — Progress Notes (Signed)
Diana Parsons,   Uric acid is normal range. I do not think this is gout. Kidney, liver, glucose look good.  Xray not back yet. Seems this could be either pseudogout or arthritis flare. Treatment plan does not change.

## 2021-03-08 NOTE — Progress Notes (Signed)
Xray shows diffuse degenerative and erosive changes in the right great toe. This looks a lot like gout but your uric acid levels were normal. I believe this is pseudogout. Hopefully the prednisone has helped a lot. You can get flares of pain from this as well.

## 2021-03-17 DIAGNOSIS — K5904 Chronic idiopathic constipation: Secondary | ICD-10-CM | POA: Diagnosis not present

## 2021-03-24 ENCOUNTER — Other Ambulatory Visit: Payer: Self-pay | Admitting: Family Medicine

## 2021-03-29 ENCOUNTER — Other Ambulatory Visit: Payer: Self-pay | Admitting: Family Medicine

## 2021-04-14 ENCOUNTER — Telehealth: Payer: Self-pay | Admitting: Family Medicine

## 2021-04-14 NOTE — Progress Notes (Signed)
  Chronic Care Management   Outreach Note  04/14/2021 Name: Diana Parsons MRN: [id number] DOB: 1934/10/14  Referred by: Hali Marry, MD Reason for referral : No chief complaint on file.   An unsuccessful telephone outreach was attempted today. The patient was referred to the pharmacist for assistance with care management and care coordination.   Follow Up Plan:   Lauretta Grill Upstream Scheduler

## 2021-04-20 ENCOUNTER — Telehealth: Payer: Self-pay | Admitting: Family Medicine

## 2021-04-20 NOTE — Chronic Care Management (AMB) (Signed)
  Chronic Care Management   Note  04/20/2021 Name: Diana Parsons MRN: [id number] DOB: 04/02/1934  Diana Parsons is a 87 y.o. year old female who is a primary care patient of Metheney, Rene Kocher, MD. I reached out to Diana Parsons by phone today in response to a referral sent by Ms. Diana Cruel Parsons's PCP, Hali Marry, MD.   Diana Parsons was given information about Chronic Care Management services today including:  CCM service includes personalized support from designated clinical staff supervised by her physician, including individualized plan of care and coordination with other care providers 24/7 contact phone numbers for assistance for urgent and routine care needs. Service will only be billed when office clinical staff spend 20 minutes or more in a month to coordinate care. Only one practitioner may furnish and bill the service in a calendar month. The patient may stop CCM services at any time (effective at the end of the month) by phone call to the office staff.   Patient agreed to services and verbal consent obtained.   Follow up plan:   Diana Parsons Upstream Scheduler

## 2021-05-19 ENCOUNTER — Telehealth: Payer: Self-pay | Admitting: Pharmacist

## 2021-05-19 NOTE — Chronic Care Management (AMB) (Signed)
    Chronic Care Management Pharmacy Assistant   Name: Diana Parsons  MRN: [id number] DOB: 01-02-1934  Diana Parsons is an 87 y.o. year old female who presents for her initial CCM visit with the clinical pharmacist.  Recent office visits:  03/06/21 [name] PA-C- Patient was seen for Great toe pain of the right foot. Labs were ordered and patient started a short course of Prednisone 50 MG for 5 days. Follow up with PCP prn.  02/27/21 [name] MD(PCP) Patient was seen for Controlled Type 2 diabetes. Labs were ordered and a referral to [facility]was placed. Patients Dulaglutide was increased to 1.5 MG subcu weekly. Famotidine was discontinued and pt advised to follow up in 14 weeks for DM follow up.  02/03/21 [name] MD- Patient was seen for Colon cancer screening as a part of her Annual Wellness. Referral was placed to [facility] for colonoscopy.Follow-up with Hali Marry, MD as planned  12/26/20 [name] MD- Patient was seen for Microalbuminuria due to DM. Patient started Metformin 1000 MG daily and discontinued Dapagliflozin and Lisinopril 5 MG.  Recent consult visits:  None noted  Hospital visits:  None in previous 6 months  Medications: Outpatient Encounter Medications as of 05/19/2021  Medication Sig   Accu-Chek FastClix Lancets MISC TEST TWICE A DAY (Patient taking differently: TEST ONCE A DAY)   aspirin 81 MG tablet Take 81 mg by mouth daily.   Blood Glucose Monitoring Suppl (ONETOUCH VERIO) w/Device KIT USE TO TEST ONCE DAILY. DX: E11.9   Calcium Carb-Cholecalciferol (CALCIUM 600/VITAMIN D3 PO) Take 1 tablet by mouth daily.   Dulaglutide (TRULICITY) 1.5 TY/6.0AY SOPN Inject 1.5 mg into the skin once a week.   fish oil-omega-3 fatty acids 1000 MG capsule Take 1 g by mouth daily.   metFORMIN (GLUCOPHAGE-XR) 500 MG 24 hr tablet TAKE 2 TABLETS BY MOUTH EVERY DAY WITH BREAKFAST   Multiple Vitamins-Minerals (PRESERVISION AREDS PO) Take 1 tablet by  mouth in the morning and at bedtime. One tablet in the morning and one in the evening   omeprazole (PRILOSEC) 40 MG capsule TAKE 1 CAPSULE BY MOUTH EVERY DAY   ONETOUCH VERIO test strip USE TO TEST BLOOD SUGAR ONCE DAILY   predniSONE (DELTASONE) 50 MG tablet Take one tablet daily for 5 days.   rosuvastatin (CRESTOR) 20 MG tablet TAKE 1 TABLET BY MOUTH EVERY DAY   No facility-administered encounter medications on file as of 05/19/2021.    Current Medication List  Dulaglutide (TRULICITY) 1.5 OK/5.9XH SOPN last filled 04/25/21 28 DS fish oil-omega-3 fatty acids 1000 MG capsule  metFORMIN 500 MG 24 hr tablet last filled 03/29/21 90 DS Multiple Vitamins-Minerals  omeprazole (PRILOSEC) 40 MG capsule last filled 02/27/21 90 DS rosuvastatin (CRESTOR) 20 MG tablet last filled 04/27/21 90 DS  [place] Pharmacist Assistant [phone]

## 2021-05-26 ENCOUNTER — Other Ambulatory Visit: Payer: Self-pay | Admitting: Family Medicine

## 2021-05-26 ENCOUNTER — Other Ambulatory Visit: Payer: Self-pay | Admitting: *Deleted

## 2021-05-29 ENCOUNTER — Telehealth: Payer: Self-pay | Admitting: Lab

## 2021-05-29 NOTE — Progress Notes (Signed)
  Chronic Care Management   Outreach Note  05/29/2021 Name: TEMPIE DELAPUENTE MRN: [id number] DOB: 1933/10/29  Referred by: Hali Marry, MD Reason for referral : Medication Management   An unsuccessful telephone outreach was attempted today. The patient was referred to the pharmacist for assistance with care management and care coordination.   Follow Up Plan:   [place]

## 2021-05-30 ENCOUNTER — Telehealth: Payer: Self-pay | Admitting: *Deleted

## 2021-05-30 ENCOUNTER — Telehealth: Payer: Medicare HMO

## 2021-05-30 NOTE — Chronic Care Management (AMB) (Signed)
  Care Management   Note  05/30/2021 Name: [name] MRN: [id number] DOB: March 26, 1934  Diana Parsons is a 87 y.o. year old female who is a primary care patient of Hali Marry, MD and is actively engaged with the care management team. I reached out to [name] by phone today to assist with re-scheduling an initial visit with the Pharmacist  Follow up plan: Telephone appointment with care management team member scheduled for: 06/06/2021  Julian Hy, [place], [place] Management  Direct Dial: [phone]

## 2021-05-30 NOTE — Chronic Care Management (AMB) (Signed)
  Care Management   Note  05/30/2021 Name: Diana Parsons MRN: [id number] DOB: 03-May-1934  Diana Parsons is a 87 y.o. year old female who is a primary care patient of Hali Marry, MD and is actively engaged with the care management team. I reached out to [name] by phone today to assist with re-scheduling an initial visit with the Pharmacist  Follow up plan: Unsuccessful telephone outreach attempt made. A HIPAA compliant phone message was left for the patient providing contact information and requesting a return call.    Julian Hy, [place] Management  Direct Dial: [phone]

## 2021-06-05 ENCOUNTER — Ambulatory Visit (INDEPENDENT_AMBULATORY_CARE_PROVIDER_SITE_OTHER): Payer: Medicare HMO | Admitting: Family Medicine

## 2021-06-05 ENCOUNTER — Encounter: Payer: Self-pay | Admitting: Family Medicine

## 2021-06-05 ENCOUNTER — Other Ambulatory Visit: Payer: Self-pay

## 2021-06-05 VITALS — BP 132/52 | HR 82 | Temp 98.8°F | Ht 66.0 in | Wt 221.0 lb

## 2021-06-05 DIAGNOSIS — E118 Type 2 diabetes mellitus with unspecified complications: Secondary | ICD-10-CM | POA: Diagnosis not present

## 2021-06-05 DIAGNOSIS — R809 Proteinuria, unspecified: Secondary | ICD-10-CM | POA: Diagnosis not present

## 2021-06-05 DIAGNOSIS — E1129 Type 2 diabetes mellitus with other diabetic kidney complication: Secondary | ICD-10-CM | POA: Diagnosis not present

## 2021-06-05 LAB — POCT GLYCOSYLATED HEMOGLOBIN (HGB A1C): Hemoglobin A1C: 6.6 % — AB (ref 4.0–5.6)

## 2021-06-05 MED ORDER — LISINOPRIL 5 MG PO TABS: 5.0000 mg | ORAL_TABLET | Freq: Every day | ORAL | 3 refills | Status: DC

## 2021-06-05 MED ORDER — METFORMIN HCL ER 500 MG PO TB24: ORAL_TABLET | ORAL | 1 refills | Status: DC

## 2021-06-05 NOTE — Progress Notes (Signed)
Established Patient Office Visit  Subjective:  Patient ID: Diana Parsons, female    DOB: [birth year]  Age: 87 y.o. MRN: [id number]  CC:  Chief Complaint  Patient presents with   Diabetes   Hypertension    HPI Diana Parsons presents for   Diabetes - no hypoglycemic events. No wounds or sores that are not healing well. No increased thirst or urination. Checking glucose at home. Taking medications as prescribed without any side effects.  He had called our office because she was feeling dizzy especially with sitting up and thought that maybe her blood pressure was running a little too low she had some really low blood pressures so we had actually had her cut her lisinopril in half she says the symptoms lasted for about a month and then eventually went away when she went to refill her prescription it was still for the 5 mg dose that she actually restarted the fives and has actually been tolerating this well since then.  In regards to her bowels she did see GI and they actually recommended holding off on a colonoscopy she is now on a regimen of MiraLAX, fiber supplement and Dulcolax and says it seems to be working well.  She actually has a follow-up with GI next month.  History reviewed. No pertinent past medical history.  Past Surgical History:  Procedure Laterality Date   ABDOMINAL HYSTERECTOMY  87 yrs old   left shoulder sx s/p fall  2001   has pin in place   lumbarp spine surgery  07/2014   Dr. Lurene Shadow    Family History  Problem Relation Age of Onset   Cancer Mother        colon   Diabetes Sister    Diabetes Brother    Cancer Brother        lung cancer    Social History   Socioeconomic History   Marital status: Legally Separated    Spouse name: Not on file   Number of children: 1   Years of education: 13   Highest education level: Some college, no degree  Occupational History    Comment: Retired  Tobacco Use   Smoking status: Former    Packs/day: 0.10    Years:  3.00    Pack years: 0.30    Types: Cigarettes   Smokeless tobacco: Former  Substance and Sexual Activity   Alcohol use: No   Drug use: No   Sexual activity: Not on file  Other Topics Concern   Not on file  Social History Narrative   Lives alone. She has a son; who lives close by. She likes to read and belongs to book club.   Social Determinants of Health   Financial Resource Strain: Low Risk    Difficulty of Paying Living Expenses: Not hard at all  Food Insecurity: No Food Insecurity   Worried About Charity fundraiser in the Last Year: Never true   Contin[place] in the Last Year: Never true  Transportation Needs: No Transportation Needs   Lack of Transportation (Medical): No   Lack of Transportation (Non-Medical): No  Physical Activity: Inactive   Days of Exercise per Week: 0 days   Minutes of Exercise per Session: 0 min  Stress: No Stress Concern Present   Feeling of Stress : Not at all  Social Connections: Moderately Integrated   Frequency of Communication with Friends and Family: More than three times a week   Frequency of Social Gatherings  with Friends and Family: More than three times a week   Attends Religious Services: More than 4 times per year   Active Member of Clubs or Organizations: Yes   Attends Music therapist: More than 4 times per year   Marital Status: Separated  Intimate Partner Violence: Not At Risk   Fear of Current or Ex-Partner: No   Emotionally Abused: No   Physically Abused: No   Sexually Abused: No    Outpatient Medications Prior to Visit  Medication Sig Dispense Refill   Accu-Chek FastClix Lancets MISC TEST TWICE A DAY (Patient taking differently: TEST ONCE A DAY) 204 each 12   aspirin 81 MG tablet Take 81 mg by mouth daily.     Blood Glucose Monitoring Suppl (ONETOUCH VERIO) w/Device KIT USE TO TEST ONCE DAILY. DX: E11.9 1 kit PRN   Calcium Carb-Cholecalciferol (CALCIUM 600/VITAMIN D3 PO) Take 1 tablet by mouth daily.      Dulaglutide (TRULICITY) 1.5 YI/9.4WN SOPN Inject 1.5 mg into the skin once a week. 2 mL 3   fish oil-omega-3 fatty acids 1000 MG capsule Take 1 g by mouth daily.     Multiple Vitamins-Minerals (PRESERVISION AREDS PO) Take 1 tablet by mouth in the morning and at bedtime. One tablet in the morning and one in the evening     omeprazole (PRILOSEC) 40 MG capsule TAKE 1 CAPSULE BY MOUTH EVERY DAY 90 capsule 3   ONETOUCH VERIO test strip USE TO TEST BLOOD SUGAR ONCE DAILY 100 strip 12   rosuvastatin (CRESTOR) 20 MG tablet TAKE 1 TABLET BY MOUTH EVERY DAY 90 tablet 3   metFORMIN (GLUCOPHAGE-XR) 500 MG 24 hr tablet TAKE 2 TABLETS BY MOUTH EVERY DAY WITH BREAKFAST 180 tablet 0   lisinopril (ZESTRIL) 5 MG tablet Take 5 mg by mouth daily.     No facility-administered medications prior to visit.    No Known Allergies  ROS Review of Systems    Objective:    Physical Exam Constitutional:      Appearance: Normal appearance. She is well-developed.  HENT:     Head: Normocephalic and atraumatic.  Cardiovascular:     Rate and Rhythm: Normal rate and regular rhythm.     Heart sounds: Normal heart sounds.  Pulmonary:     Effort: Pulmonary effort is normal.     Breath sounds: Normal breath sounds.  Skin:    General: Skin is warm and dry.  Neurological:     Mental Status: She is alert and oriented to person, place, and time.  Psychiatric:        Behavior: Behavior normal.    BP (!) 132/52   Pulse 82   Temp 98.8 F (37.1 C)   Ht 5' 6"  (1.676 m)   Wt 221 lb (100.2 kg)   SpO2 98%   BMI 35.67 kg/m  Wt Readings from Last 3 Encounters:  06/05/21 221 lb (100.2 kg)  03/06/21 220 lb (99.8 kg)  02/27/21 219 lb (99.3 kg)     Health Maintenance Due  Topic Date Due   INFLUENZA VACCINE  05/15/2021   OPHTHALMOLOGY EXAM  05/18/2021    There are no preventive care reminders to display for this patient.  No results found for: TSH No results found for: WBC, HGB, HCT, MCV, PLT Lab Results   Component Value Date   NA 139 03/06/2021   K 4.4 03/06/2021   CO2 28 03/06/2021   GLUCOSE 88 03/06/2021   BUN 17 03/06/2021   CREATININE  0.77 03/06/2021   BILITOT 0.3 11/18/2020   ALKPHOS 62 06/20/2016   AST 15 11/18/2020   ALT 9 11/18/2020   PROT 6.7 11/18/2020   ALBUMIN 3.8 06/20/2016   CALCIUM 9.5 03/06/2021   Lab Results  Component Value Date   CHOL 113 11/18/2020   Lab Results  Component Value Date   HDL 41 (L) 11/18/2020   Lab Results  Component Value Date   LDLCALC 51 11/18/2020   Lab Results  Component Value Date   TRIG 119 11/18/2020   Lab Results  Component Value Date   CHOLHDL 2.8 11/18/2020   Lab Results  Component Value Date   HGBA1C 6.6 (A) 06/05/2021      Assessment & Plan:   Problem List Items Addressed This Visit       Endocrine   Microalbuminuria due to type 2 diabetes mellitus ([place])    Continue with low-dose ACE inhibitor 5 mg daily.      Relevant Medications   lisinopril (ZESTRIL) 5 MG tablet   metFORMIN (GLUCOPHAGE-XR) 500 MG 24 hr tablet   Controlled diabetes mellitus type 2 with complications ([place]) - Primary    1C looks phenomenal today at 6.6.  She says she really like to get her A1c down even more but I discussed with her that I do not want her having any hypoglycemic events I think where she is at is perfect.  Continue statin and ACE inhibitor.      Relevant Medications   lisinopril (ZESTRIL) 5 MG tablet   metFORMIN (GLUCOPHAGE-XR) 500 MG 24 hr tablet   Other Relevant Orders   POCT glycosylated hemoglobin (Hb A1C) (Completed)   Encouraged her to schedule her flu vaccine for this fall.  Meds ordered this encounter  Medications   lisinopril (ZESTRIL) 5 MG tablet    Sig: Take 1 tablet (5 mg total) by mouth daily.    Dispense:  90 tablet    Refill:  3   metFORMIN (GLUCOPHAGE-XR) 500 MG 24 hr tablet    Sig: TAKE 2 TABLETS BY MOUTH EVERY DAY WITH BREAKFAST    Dispense:  180 tablet    Refill:  1    Follow-up: Return in  about 4 months (around 10/05/2021) for Diabetes follow-up.    [name], MD

## 2021-06-05 NOTE — Assessment & Plan Note (Addendum)
1C looks phenomenal today at 6.6.  She says she really like to get her A1c down even more but I discussed with her that I do not want her having any hypoglycemic events I think where she is at is perfect.  Continue statin and ACE inhibitor.

## 2021-06-05 NOTE — Assessment & Plan Note (Signed)
Continue with low-dose ACE inhibitor 5 mg daily.

## 2021-06-06 ENCOUNTER — Ambulatory Visit (INDEPENDENT_AMBULATORY_CARE_PROVIDER_SITE_OTHER): Payer: Medicare HMO | Admitting: Pharmacist

## 2021-06-06 DIAGNOSIS — E119 Type 2 diabetes mellitus without complications: Secondary | ICD-10-CM | POA: Diagnosis not present

## 2021-06-06 DIAGNOSIS — E118 Type 2 diabetes mellitus with unspecified complications: Secondary | ICD-10-CM | POA: Diagnosis not present

## 2021-06-06 DIAGNOSIS — E7800 Pure hypercholesterolemia, unspecified: Secondary | ICD-10-CM | POA: Diagnosis not present

## 2021-06-06 NOTE — Progress Notes (Signed)
Chronic Care Management Pharmacy Note  06/07/2021 Name:  Diana Parsons MRN:  [id number] DOB:  [birth year]  Summary: addressed DM, HLD.   Recommendations/Changes made from today's visit: No changes, beginning paperwork for trulicity patient assistance to ease cost burden for patient.  Plan: f/u with pharmacist in 1 week  Subjective: Diana Parsons is an 87 y.o. year old female who is a primary patient of Metheney, Rene Kocher, MD.  The CCM team was consulted for assistance with disease management and care coordination needs.    Engaged with patient by telephone for initial visit in response to provider referral for pharmacy case management and/or care coordination services.   Consent to Services:  The patient was given information about Chronic Care Management services, agreed to services, and gave verbal consent prior to initiation of services.  Please see initial visit note for detailed documentation.   Patient Care Team: Hali Marry, MD as PCP - General Marlise Eves Daubert MD (Orthopedic Surgery) Darius Bump, [facility] as Pharmacist (Pharmacist)  Recent office visits:  03/06/21 [name] PA-C- Patient was seen for Great toe pain of the right foot. Labs were ordered and patient started a short course of Prednisone 50 MG for 5 days. Follow up with PCP prn.   02/27/21 [name] MD(PCP) Patient was seen for Controlled Type 2 diabetes. Labs were ordered and a referral to [facility]was placed. Patients Dulaglutide was increased to 1.5 MG subcu weekly. Famotidine was discontinued and pt advised to follow up in 14 weeks for DM follow up.   02/03/21 [name] MD- Patient was seen for Colon cancer screening as a part of her Annual Wellness. Referral was placed to [facility] for colonoscopy.Follow-up with Hali Marry, MD as planned   12/26/20 [name] MD- Patient was seen for Microalbuminuria due to DM. Patient started Metformin 1000 MG daily and  discontinued Dapagliflozin and Lisinopril 5 MG.   Recent consult visits:  None noted   Hospital visits:  None in previous 6 months    Objective:  Lab Results  Component Value Date   CREATININE 0.77 03/06/2021   CREATININE 0.68 11/18/2020   CREATININE 0.79 05/04/2020    Lab Results  Component Value Date   HGBA1C 6.6 (A) 06/05/2021   Last diabetic Eye exam:  Lab Results  Component Value Date/Time   HMDIABEYEEXA No Retinopathy 05/11/2020 12:00 AM    Last diabetic Foot exam:  Lab Results  Component Value Date/Time   HMDIABFOOTEX yes 02/07/2009 12:00 AM        Component Value Date/Time   CHOL 113 11/18/2020 0923   TRIG 119 11/18/2020 0923   HDL 41 (L) 11/18/2020 0923   CHOLHDL 2.8 11/18/2020 0923   VLDL 24 06/20/2016 0939   LDLCALC 51 11/18/2020 0923   LDLDIRECT 104 (H) 09/14/2011 0935    Hepatic Function Latest Ref Rng & Units 11/18/2020 10/05/2019 07/22/2018  Total Protein 6.1 - 8.1 g/dL 6.7 6.8 6.5  Albumin 3.6 - 5.1 g/dL - - -  AST 10 - 35 U/L 15 28 26   ALT 6 - 29 U/L 9 24 19   Alk Phosphatase 33 - 130 U/L - - -  Total Bilirubin 0.2 - 1.2 mg/dL 0.3 0.4 0.4  Bilirubin, Direct 0.0 - 0.3 mg/dL - - -    No results found for: TSH, FREET4  No flowsheet data found.  Lab Results  Component Value Date/Time   VD25OH 32 07/22/2018 09:03 AM   VD25OH 22 (L) 11/24/2015 11:00 AM  Social History   Tobacco Use  Smoking Status Former   Packs/day: 0.10   Years: 3.00   Pack years: 0.30   Types: Cigarettes  Smokeless Tobacco Former   BP Readings from Last 3 Encounters:  06/05/21 (!) 132/52  03/06/21 (!) 145/48  02/27/21 (!) 131/53   Pulse Readings from Last 3 Encounters:  06/05/21 82  03/06/21 90  02/27/21 85   Wt Readings from Last 3 Encounters:  06/05/21 221 lb (100.2 kg)  03/06/21 220 lb (99.8 kg)  02/27/21 219 lb (99.3 kg)    Assessment: Review of patient past medical history, allergies, medications, health status, including review of consultants  reports, laboratory and other test data, was performed as part of comprehensive evaluation and provision of chronic care management services.   SDOH:  (Social Determinants of Health) assessments and interventions performed:    CCM Care Plan  No Known Allergies  Medications Reviewed Today     Reviewed by Darius Bump, [facility] (Pharmacist) on 06/06/21 at 1326  Med List Status: <None>   Medication Order Taking? Sig Documenting Provider Last Dose Status Informant  Accu-Chek FastClix Lancets MISC [id number] Yes TEST TWICE A DAY  Patient taking differently: TEST ONCE A DAY   Hali Marry, MD Taking Active   aspirin 81 MG tablet [id number] Yes Take 81 mg by mouth daily. [provider] Taking Active   bisacodyl (DULCOLAX) 5 MG EC tablet [id number] Yes Take 5 mg by mouth daily as needed for moderate constipation. [provider] Taking Active   Blood Glucose Monitoring Suppl [facility] VERIO) w/Device Drucie Opitz [id number] Yes USE TO TEST ONCE DAILY. DX: E11.9 Hali Marry, MD Taking Active   Calcium Carb-Cholecalciferol (CALCIUM 600/VITAMIN D3 PO) [id number] Yes Take 1 tablet by mouth daily. [provider] Taking Active Self  Dulaglutide (TRULICITY) 1.5 RC/7.8LF SOPN [id number] Yes Inject 1.5 mg into the skin once a week. Hali Marry, MD Taking Active            Med Note [name] Jun 06, 2021  1:21 PM) Taking on Fridays  famotidine (PEPCID) 20 MG tablet [id number] Yes Take 20 mg by mouth daily. [provider] Taking Active   fish oil-omega-3 fatty acids 1000 MG capsule [id number] Yes Take 1 g by mouth daily. [provider] Taking Active   lisinopril (ZESTRIL) 5 MG tablet [id number] Yes Take 1 tablet (5 mg total) by mouth daily. Hali Marry, MD Taking Active   metFORMIN (GLUCOPHAGE-XR) 500 MG 24 hr tablet [id number] Yes TAKE 2 TABLETS BY MOUTH EVERY DAY WITH BREAKFAST Hali Marry, MD Taking Active   Multiple  Vitamins-Minerals (PRESERVISION AREDS PO) [id number] Yes Take 1 tablet by mouth in the morning and at bedtime. One tablet in the morning and one in the evening [provider] Taking Active   omeprazole (PRILOSEC) 40 MG capsule [id number] Yes TAKE 1 CAPSULE BY MOUTH EVERY DAY Hali Marry, MD Taking Active   [facility] VERIO test strip [id number] Yes USE TO TEST BLOOD SUGAR ONCE DAILY Hali Marry, MD Taking Active   polyethylene glycol (MIRALAX / GLYCOLAX) 17 g packet [id number] Yes Take 17 g by mouth daily. [provider] Taking Active   psyllium (METAMUCIL) 58.6 % packet [id number] Yes Take 1 packet by mouth daily. [provider] Taking Active   rosuvastatin (CRESTOR) 20 MG tablet [id number] Yes TAKE 1 TABLET BY MOUTH EVERY DAY Hali Marry, MD Taking Active  Patient Active Problem List   Diagnosis Date Noted   Edema of right foot 03/06/2021   Great toe pain, right 03/06/2021   Combined forms of age-related cataract of left eye 06/02/2020   Aortic valve sclerosis 11/23/2019   Family history of lung cancer 11/09/2019   Orthopnea 11/09/2019   PND (paroxysmal nocturnal dyspnea) 11/09/2019   Esophagitis 11/09/2019   BMI 34.0-34.9,adult 07/03/2019   Microalbuminuria due to type 2 diabetes mellitus ([place]) 07/20/2016   Lumbar degenerative disc disease 05/03/2014   Status post lumbar surgery 09/03/2013   Spinal stenosis of lumbar region without neurogenic claudication 08/30/2013   Tear of meniscus of knee 05/29/2013   Osteoarthritis of left knee 05/28/2012   Controlled diabetes mellitus type 2 with complications ([place]) 24/26/8341   HYPERCHOLESTEROLEMIA, PURE 02/03/2007   BREAST MASS, LEFT 02/03/2007   POSTMENOPAUSAL STATUS 02/03/2007    Immunization History  Administered Date(s) Administered   Fluad Quad(high Dose 65+) 07/01/2019   Influenza Split 08/14/2011   Influenza Whole 07/15/2006, 08/12/2007, 08/06/2008, 09/06/2009,  06/30/2010   Influenza, High Dose Seasonal PF 07/20/2016, 06/25/2017, 07/17/2018, 07/22/2020   Influenza, Seasonal, Injecte, Preservative Fre 09/19/2012   Influenza,inj,Quad PF,6+ Mos 08/11/2013, 06/18/2014, 08/24/2015   PFIZER Comirnaty(Gray Top)Covid-19 Tri-Sucrose Vaccine 05/02/2021   PFIZER(Purple Top)SARS-COV-2 Vaccination 12/17/2019, 01/13/2020, 07/22/2020   Pneumococcal Conjugate-13 09/24/2014   Pneumococcal Polysaccharide-23 07/15/2006   Td 10/16/2003   Tdap 11/12/2013   Zoster Recombinat (Shingrix) 02/20/2017   Zoster, Live 10/15/2010    Conditions to be addressed/monitored: HLD and DMII  Care Plan : Medication Management  Updates made by Darius Bump, [place] since 06/07/2021 12:00 AM     Problem: HLD, DM      Long-Range Goal: Disease Progression Prevention   Start Date: 06/06/2021  This Visit's Progress: On track  Priority: High  Note:   Current Barriers:  Unable to independently afford treatment regimen  Pharmacist Clinical Goal(s):  Over the next 7 days, patient will verbalize ability to afford treatment regimen through collaboration with PharmD and provider.   Interventions: 1:1 collaboration with Hali Marry, MD regarding development and update of comprehensive plan of care as evidenced by provider attestation and co-signature Inter-disciplinary care team collaboration (see longitudinal plan of care) Comprehensive medication review performed; medication list updated in electronic medical record  Diabetes:  Controlled; current treatment:trulicity 9.6QI weekly, metformin XR 1032m daily; a1c 6.6  Current glucose readings: fasting glucose: 130s   Denies hypoglycemic/hyperglycemic symptoms  Current meal patterns: breakfast: cereal with milk & blueberries, bran or shredded wheat with coffee, sometimes boiled egg + applesauce, piece of toast, or oatmeal with fruit; lunch: sandwich, or leftovers; dinner: chicken or fish & vegetables; snacks: potato chips,  cheese puffs; drinks: unsweet iced tea (adds truvia substitute), diet peach tea, diet ginger ale, water  Current exercise: struggles, not very active. Likes to read a lot. Uses a cane to walk  Recommended continue current regimen Assessed patient finances. Patient is eligible [facility]. Will begin paperwork.  Hyperlipidemia:  Controlled; current treatment:rosuvastatin 262mdaily; LDL 51  Recommended continue current regimen   Patient Goals/Self-Care Activities Over the next 7 days, patient will:  collaborate with provider on medication access solutions  Follow Up Plan: Face to Face appointment with care management team member scheduled for: 1 week      Medication Assistance: Application for trulicity  medication assistance program. in process.  Anticipated assistance start date TBD.  See plan of care for additional detail.  Patient's preferred pharmacy is:  [facility], [address] [place] [address] Phone: [phone] Fax: [phone]  [facility] [place], [place] [address] [place] [address] Phone: [phone] Fax: [phone]  Uses pill box? Yes 2 week AM and PM  Pt endorses 100% compliance  Follow Up:  Patient agrees to Care Plan and Follow-up.  Plan: Face to Face appointment with care management team member scheduled for: 1 week  Darius Bump

## 2021-06-07 NOTE — Patient Instructions (Signed)
Visit Information   PATIENT GOALS:   Goals Addressed             This Visit's Progress    Medication Management       Patient Goals/Self-Care Activities Over the next 7 days, patient will:  collaborate with provider on medication access solutions  Follow Up Plan: Face to Face appointment with care management team member scheduled for: 1 week         Consent to CCM Services: Ms. Savannah was given information about Chronic Care Management services including:  CCM service includes personalized support from designated clinical staff supervised by her physician, including individualized plan of care and coordination with other care providers 24/7 contact phone numbers for assistance for urgent and routine care needs. Service will only be billed when office clinical staff spend 20 minutes or more in a month to coordinate care. Only one practitioner may furnish and bill the service in a calendar month. The patient may stop CCM services at any time (effective at the end of the month) by phone call to the office staff. The patient will be responsible for cost sharing (co-pay) of up to 20% of the service fee (after annual deductible is met).  Patient agreed to services and verbal consent obtained.   Patient verbalizes understanding of instructions provided today and agrees to view in [place].   Face to Face appointment with care management team member scheduled for:  1 week [place] CARE PLAN: Patient Care Plan: Medication Management     Problem Identified: HLD, DM      Long-Range Goal: Disease Progression Prevention   Start Date: 06/06/2021  This Visit's Progress: On track  Priority: High  Note:   Current Barriers:  Unable to independently afford treatment regimen  Pharmacist Clinical Goal(s):  Over the next 7 days, patient will verbalize ability to afford treatment regimen through collaboration with PharmD and provider.   Interventions: 1:1  collaboration with Hali Marry, MD regarding development and update of comprehensive plan of care as evidenced by provider attestation and co-signature Inter-disciplinary care team collaboration (see longitudinal plan of care) Comprehensive medication review performed; medication list updated in electronic medical record  Diabetes:  Controlled; current treatment:trulicity 7.8LF weekly, metformin XR 1082m daily; a1c 6.6  Current glucose readings: fasting glucose: 130s   Denies hypoglycemic/hyperglycemic symptoms  Current meal patterns: breakfast: cereal with milk & blueberries, bran or shredded wheat with coffee, sometimes boiled egg + applesauce, piece of toast, or oatmeal with fruit; lunch: sandwich, or leftovers; dinner: chicken or fish & vegetables; snacks: potato chips, cheese puffs; drinks: unsweet iced tea (adds truvia substitute), diet peach tea, diet ginger ale, water  Current exercise: struggles, not very active. Likes to read a lot. Uses a cane to walk  Recommended continue current regimen Assessed patient finances. Patient is [facility]. Will begin paperwork.  Hyperlipidemia:  Controlled; current treatment:rosuvastatin 215mdaily; LDL 51  Recommended continue current regimen   Patient Goals/Self-Care Activities Over the next 7 days, patient will:  collaborate with provider on medication access solutions  Follow Up Plan: Face to Face appointment with care management team member scheduled for: 1 week

## 2021-06-14 ENCOUNTER — Ambulatory Visit: Payer: Medicare HMO | Admitting: Pharmacist

## 2021-06-14 ENCOUNTER — Other Ambulatory Visit: Payer: Self-pay

## 2021-06-14 DIAGNOSIS — E7800 Pure hypercholesterolemia, unspecified: Secondary | ICD-10-CM

## 2021-06-14 DIAGNOSIS — E119 Type 2 diabetes mellitus without complications: Secondary | ICD-10-CM

## 2021-06-14 DIAGNOSIS — E118 Type 2 diabetes mellitus with unspecified complications: Secondary | ICD-10-CM

## 2021-06-14 MED ORDER — ONETOUCH VERIO W/DEVICE KIT: PACK | 99 refills | Status: DC

## 2021-06-14 NOTE — Patient Instructions (Signed)
Visit Information  PATIENT GOALS:  Goals Addressed             This Visit's Progress    Medication Management       Patient Goals/Self-Care Activities Over the next 30 days, patient will:  collaborate with provider on medication access solutions  Follow Up Plan: Telephone appointment with care management team member scheduled for: 1 month        Patient verbalizes understanding of instructions provided today and agrees to view in [place].   Telephone follow up appointment with care management team member scheduled for: 1 month

## 2021-06-14 NOTE — Progress Notes (Signed)
Meds ordered this encounter  Medications   Blood Glucose Monitoring Suppl (ONETOUCH VERIO) w/Device KIT    Sig: USE TO TEST ONCE DAILY. DX: E11.9    Dispense:  1 kit    Refill:  PRN   \

## 2021-06-14 NOTE — Addendum Note (Signed)
Addended by: [name] D on: 06/14/2021 11:54 AM   Modules accepted: Orders

## 2021-06-14 NOTE — Progress Notes (Signed)
Chronic Care Management Pharmacy Note  06/14/2021 Name:  Diana Parsons MRN:  [id number] DOB:  1934/07/30  Summary: addressed HLD, and primarily DM. Completed paperwork for trulicity patient assistance. Patient needs refill of verio glucose test kit (lancing device broke).   Recommendations/Changes made from today's visit: none  Plan: f/u with pharmacist in 1 month  Subjective: Diana Parsons is an 87 y.o. year old female who is a primary patient of Metheney, Rene Kocher, MD.  The CCM team was consulted for assistance with disease management and care coordination needs.    Engaged with patient face to face for follow up visit in response to provider referral for pharmacy case management and/or care coordination services.   Consent to Services:  The patient was given information about Chronic Care Management services, agreed to services, and gave verbal consent prior to initiation of services.  Please see initial visit note for detailed documentation.   Patient Care Team: Hali Marry, MD as PCP - General Artist Pais MD (Orthopedic Surgery) Darius Bump, [facility] as Pharmacist (Pharmacist)  03/06/21 [name] PA-C- Patient was seen for Great toe pain of the right foot. Labs were ordered and patient started a short course of Prednisone 50 MG for 5 days. Follow up with PCP prn.   02/27/21 [name] MD(PCP) Patient was seen for Controlled Type 2 diabetes. Labs were ordered and a referral to [facility]was placed. Patients Dulaglutide was increased to 1.5 MG subcu weekly. Famotidine was discontinued and pt advised to follow up in 14 weeks for DM follow up.   02/03/21 [name] MD- Patient was seen for Colon cancer screening as a part of her Annual Wellness. Referral was placed to [facility] for colonoscopy.Follow-up with Hali Marry, MD as planned   12/26/20 [name] MD- Patient was seen for Microalbuminuria due to DM. Patient started Metformin  1000 MG daily and discontinued Dapagliflozin and Lisinopril 5 MG.   Recent consult visits:  None noted   Hospital visits:  None in previous 6 months    Objective:  Lab Results  Component Value Date   CREATININE 0.77 03/06/2021   CREATININE 0.68 11/18/2020   CREATININE 0.79 05/04/2020    Lab Results  Component Value Date   HGBA1C 6.6 (A) 06/05/2021   Last diabetic Eye exam:  Lab Results  Component Value Date/Time   HMDIABEYEEXA No Retinopathy 05/11/2020 12:00 AM    Last diabetic Foot exam:  Lab Results  Component Value Date/Time   HMDIABFOOTEX yes 02/07/2009 12:00 AM        Component Value Date/Time   CHOL 113 11/18/2020 0923   TRIG 119 11/18/2020 0923   HDL 41 (L) 11/18/2020 0923   CHOLHDL 2.8 11/18/2020 0923   VLDL 24 06/20/2016 0939   LDLCALC 51 11/18/2020 0923   LDLDIRECT 104 (H) 09/14/2011 0935    Hepatic Function Latest Ref Rng & Units 11/18/2020 10/05/2019 07/22/2018  Total Protein 6.1 - 8.1 g/dL 6.7 6.8 6.5  Albumin 3.6 - 5.1 g/dL - - -  AST 10 - 35 U/L 15 28 26   ALT 6 - 29 U/L 9 24 19   Alk Phosphatase 33 - 130 U/L - - -  Total Bilirubin 0.2 - 1.2 mg/dL 0.3 0.4 0.4  Bilirubin, Direct 0.0 - 0.3 mg/dL - - -    No results found for: TSH, FREET4  No flowsheet data found.  Lab Results  Component Value Date/Time   VD25OH 32 07/22/2018 09:03 AM   VD25OH 22 (  L) 11/24/2015 11:00 AM   Social History   Tobacco Use  Smoking Status Former   Packs/day: 0.10   Years: 3.00   Pack years: 0.30   Types: Cigarettes  Smokeless Tobacco Former   BP Readings from Last 3 Encounters:  06/05/21 (!) 132/52  03/06/21 (!) 145/48  02/27/21 (!) 131/53   Pulse Readings from Last 3 Encounters:  06/05/21 82  03/06/21 90  02/27/21 85   Wt Readings from Last 3 Encounters:  06/05/21 221 lb (100.2 kg)  03/06/21 220 lb (99.8 kg)  02/27/21 219 lb (99.3 kg)    Assessment: Review of patient past medical history, allergies, medications, health status, including review  of consultants reports, laboratory and other test data, was performed as part of comprehensive evaluation and provision of chronic care management services.   SDOH:  (Social Determinants of Health) assessments and interventions performed:    CCM Care Plan  No Known Allergies  Medications Reviewed Today     Reviewed by Darius Bump, [facility] (Pharmacist) on 06/06/21 at 1326  Med List Status: <None>   Medication Order Taking? Sig Documenting Provider Last Dose Status Informant  Accu-Chek FastClix Lancets MISC [id number] Yes TEST TWICE A DAY  Patient taking differently: TEST ONCE A DAY   Hali Marry, MD Taking Active   aspirin 81 MG tablet [id number] Yes Take 81 mg by mouth daily. [provider] Taking Active   bisacodyl (DULCOLAX) 5 MG EC tablet [id number] Yes Take 5 mg by mouth daily as needed for moderate constipation. [provider] Taking Active   Blood Glucose Monitoring Suppl [facility] VERIO) w/Device Drucie Opitz [id number] Yes USE TO TEST ONCE DAILY. DX: E11.9 Hali Marry, MD Taking Active   Calcium Carb-Cholecalciferol (CALCIUM 600/VITAMIN D3 PO) [id number] Yes Take 1 tablet by mouth daily. [provider] Taking Active Self  Dulaglutide (TRULICITY) 1.5 QZ/0.0PQ SOPN [id number] Yes Inject 1.5 mg into the skin once a week. Hali Marry, MD Taking Active            Med Note [name] Jun 06, 2021  1:21 PM) Taking on Fridays  famotidine (PEPCID) 20 MG tablet [id number] Yes Take 20 mg by mouth daily. [provider] Taking Active   fish oil-omega-3 fatty acids 1000 MG capsule [id number] Yes Take 1 g by mouth daily. [provider] Taking Active   lisinopril (ZESTRIL) 5 MG tablet [id number] Yes Take 1 tablet (5 mg total) by mouth daily. Hali Marry, MD Taking Active   metFORMIN (GLUCOPHAGE-XR) 500 MG 24 hr tablet [id number] Yes TAKE 2 TABLETS BY MOUTH EVERY DAY WITH BREAKFAST Hali Marry, MD Taking  Active   Multiple Vitamins-Minerals (PRESERVISION AREDS PO) [id number] Yes Take 1 tablet by mouth in the morning and at bedtime. One tablet in the morning and one in the evening [provider] Taking Active   omeprazole (PRILOSEC) 40 MG capsule [id number] Yes TAKE 1 CAPSULE BY MOUTH EVERY DAY Hali Marry, MD Taking Active   [facility] VERIO test strip [id number] Yes USE TO TEST BLOOD SUGAR ONCE DAILY Hali Marry, MD Taking Active   polyethylene glycol (MIRALAX / GLYCOLAX) 17 g packet [id number] Yes Take 17 g by mouth daily. [provider] Taking Active   psyllium (METAMUCIL) 58.6 % packet [id number] Yes Take 1 packet by mouth daily. [provider] Taking Active   rosuvastatin (CRESTOR) 20 MG tablet [id number] Yes TAKE 1 TABLET BY MOUTH EVERY DAY Hali Marry, MD  Taking Active             Patient Active Problem List   Diagnosis Date Noted   Edema of right foot 03/06/2021   Great toe pain, right 03/06/2021   Combined forms of age-related cataract of left eye 06/02/2020   Aortic valve sclerosis 11/23/2019   Family history of lung cancer 11/09/2019   Orthopnea 11/09/2019   PND (paroxysmal nocturnal dyspnea) 11/09/2019   Esophagitis 11/09/2019   BMI 34.0-34.9,adult 07/03/2019   Microalbuminuria due to type 2 diabetes mellitus ([place]) 07/20/2016   Lumbar degenerative disc disease 05/03/2014   Status post lumbar surgery 09/03/2013   Spinal stenosis of lumbar region without neurogenic claudication 08/30/2013   Tear of meniscus of knee 05/29/2013   Osteoarthritis of left knee 05/28/2012   Controlled diabetes mellitus type 2 with complications ([place]) 74/05/1447   HYPERCHOLESTEROLEMIA, PURE 02/03/2007   BREAST MASS, LEFT 02/03/2007   POSTMENOPAUSAL STATUS 02/03/2007    Immunization History  Administered Date(s) Administered   Fluad Quad(high Dose 65+) 07/01/2019   Influenza Split 08/14/2011   Influenza Whole 07/15/2006, 08/12/2007,  08/06/2008, 09/06/2009, 06/30/2010   Influenza, High Dose Seasonal PF 07/20/2016, 06/25/2017, 07/17/2018, 07/22/2020   Influenza, Seasonal, Injecte, Preservative Fre 09/19/2012   Influenza,inj,Quad PF,6+ Mos 08/11/2013, 06/18/2014, 08/24/2015   PFIZER Comirnaty(Gray Top)Covid-19 Tri-Sucrose Vaccine 05/02/2021   PFIZER(Purple Top)SARS-COV-2 Vaccination 12/17/2019, 01/13/2020, 07/22/2020   Pneumococcal Conjugate-13 09/24/2014   Pneumococcal Polysaccharide-23 07/15/2006   Td 10/16/2003   Tdap 11/12/2013   Zoster Recombinat (Shingrix) 02/20/2017   Zoster, Live 10/15/2010    Conditions to be addressed/monitored: HLD and DMII  Care Plan : Medication Management  Updates made by Darius Bump, [place] since 06/14/2021 12:00 AM     Problem: HLD, DM      Long-Range Goal: Disease Progression Prevention   Start Date: 06/06/2021  Recent Progress: On track  Priority: High  Note:   Current Barriers:  Unable to independently afford treatment regimen  Pharmacist Clinical Goal(s):  Over the next 30 days, patient will verbalize ability to afford treatment regimen through collaboration with PharmD and provider.   Interventions: 1:1 collaboration with Hali Marry, MD regarding development and update of comprehensive plan of care as evidenced by provider attestation and co-signature Inter-disciplinary care team collaboration (see longitudinal plan of care) Comprehensive medication review performed; medication list updated in electronic medical record  Diabetes:  Controlled; current treatment:trulicity 1.8HU weekly, metformin XR 1048m daily; a1c 6.6  Current glucose readings: fasting glucose: 130s   Denies hypoglycemic/hyperglycemic symptoms  Current meal patterns: breakfast: cereal with milk & blueberries, bran or shredded wheat with coffee, sometimes boiled egg + applesauce, piece of toast, or oatmeal with fruit; lunch: sandwich, or leftovers; dinner: chicken or fish & vegetables;  snacks: potato chips, cheese puffs; drinks: unsweet iced tea (adds truvia substitute), diet peach tea, diet ginger ale, water  Current exercise: struggles, not very active. Likes to read a lot. Uses a cane to walk  Recommended continue current regimen Assessed patient finances. [facility], patient provided paperwork at today's visit, will complete & fax. Hyperlipidemia:  Controlled; current treatment:rosuvastatin 261mdaily; LDL 51  Recommended continue current regimen   Patient Goals/Self-Care Activities Over the next 30 days, patient will:  collaborate with provider on medication access solutions  Follow Up Plan: Telephone appointment with care management team member scheduled for: 1 month     Medication Assistance: Application for Trulicity  medication assistance program. in process.  Anticipated assistance start  date TBD.  See plan of care for additional detail.  Patient's preferred pharmacy is:  [facility], [place] [address] Phone: [phone] Fax: [phone]  [facility] [place], [place] [address] [place] [address] Phone: [phone] Fax: [phone]  Uses pill box? Yes Pt endorses 100% compliance  Follow Up:  Patient agrees to Care Plan and Follow-up.  Plan: Telephone follow up appointment with care management team member scheduled for:  1 month  Darius Bump

## 2021-06-16 ENCOUNTER — Other Ambulatory Visit: Payer: Self-pay | Admitting: Family Medicine

## 2021-06-16 ENCOUNTER — Telehealth: Payer: Self-pay | Admitting: *Deleted

## 2021-06-16 DIAGNOSIS — E118 Type 2 diabetes mellitus with unspecified complications: Secondary | ICD-10-CM

## 2021-06-16 DIAGNOSIS — K5904 Chronic idiopathic constipation: Secondary | ICD-10-CM | POA: Diagnosis not present

## 2021-06-16 NOTE — Telephone Encounter (Signed)
Form completed,faxed,confirmation received and scanned into patient's chart. 

## 2021-07-27 ENCOUNTER — Telehealth: Payer: Medicare HMO

## 2021-07-27 ENCOUNTER — Telehealth: Payer: Self-pay | Admitting: Pharmacist

## 2021-07-27 NOTE — Chronic Care Management (AMB) (Signed)
  Care Management   Note  07/27/2021 Name: Diana Parsons MRN: [id number] DOB: 07/19/1934  Diana Parsons is a 87 y.o. year old female who is a primary care patient of Hali Marry, MD and is actively engaged with the care management team. I reached out to [name] by phone today to assist with re-scheduling a follow up visit with the Pharmacist  Follow up plan: Unsuccessful telephone outreach attempt made. A HIPAA compliant phone message was left for the patient providing contact information and requesting a return call.   Julian Hy, [place] Management  Direct Dial: [phone]

## 2021-07-27 NOTE — Progress Notes (Deleted)
Chronic Care Management Pharmacy Note  07/27/2021 Name:  Diana Parsons MRN:  [id number] DOB:  October 30, 1933  Summary: addressed HLD, and primarily DM. Completed paperwork for trulicity patient assistance. Patient needs refill of verio glucose test kit (lancing device broke).   Recommendations/Changes made from today's visit: none  Plan: f/u with pharmacist in 1 month  Subjective: Diana Parsons is an 87 y.o. year old female who is a primary patient of Metheney, Rene Kocher, MD.  The CCM team was consulted for assistance with disease management and care coordination needs.    Engaged with patient face to face for follow up visit in response to provider referral for pharmacy case management and/or care coordination services.   Consent to Services:  The patient was given information about Chronic Care Management services, agreed to services, and gave verbal consent prior to initiation of services.  Please see initial visit note for detailed documentation.   Patient Care Team: Hali Marry, MD as PCP - General Artist Pais MD (Orthopedic Surgery) Darius Bump, [facility] as Pharmacist (Pharmacist)  03/06/21 [name] PA-C- Patient was seen for Great toe pain of the right foot. Labs were ordered and patient started a short course of Prednisone 50 MG for 5 days. Follow up with PCP prn.   02/27/21 [name] MD(PCP) Patient was seen for Controlled Type 2 diabetes. Labs were ordered and a referral to [facility]was placed. Patients Dulaglutide was increased to 1.5 MG subcu weekly. Famotidine was discontinued and pt advised to follow up in 14 weeks for DM follow up.   02/03/21 [name] MD- Patient was seen for Colon cancer screening as a part of her Annual Wellness. Referral was placed to [facility] for colonoscopy.Follow-up with Hali Marry, MD as planned   12/26/20 [name] MD- Patient was seen for Microalbuminuria due to DM. Patient started Metformin  1000 MG daily and discontinued Dapagliflozin and Lisinopril 5 MG.   Recent consult visits:  None noted   Hospital visits:  None in previous 6 months    Objective:  Lab Results  Component Value Date   CREATININE 0.77 03/06/2021   CREATININE 0.68 11/18/2020   CREATININE 0.79 05/04/2020    Lab Results  Component Value Date   HGBA1C 6.6 (A) 06/05/2021   Last diabetic Eye exam:  Lab Results  Component Value Date/Time   HMDIABEYEEXA No Retinopathy 05/11/2020 12:00 AM    Last diabetic Foot exam:  Lab Results  Component Value Date/Time   HMDIABFOOTEX yes 02/07/2009 12:00 AM        Component Value Date/Time   CHOL 113 11/18/2020 0923   TRIG 119 11/18/2020 0923   HDL 41 (L) 11/18/2020 0923   CHOLHDL 2.8 11/18/2020 0923   VLDL 24 06/20/2016 0939   LDLCALC 51 11/18/2020 0923   LDLDIRECT 104 (H) 09/14/2011 0935    Hepatic Function Latest Ref Rng & Units 11/18/2020 10/05/2019 07/22/2018  Total Protein 6.1 - 8.1 g/dL 6.7 6.8 6.5  Albumin 3.6 - 5.1 g/dL - - -  AST 10 - 35 U/L 15 28 26   ALT 6 - 29 U/L 9 24 19   Alk Phosphatase 33 - 130 U/L - - -  Total Bilirubin 0.2 - 1.2 mg/dL 0.3 0.4 0.4  Bilirubin, Direct 0.0 - 0.3 mg/dL - - -    No results found for: TSH, FREET4  No flowsheet data found.  Lab Results  Component Value Date/Time   VD25OH 32 07/22/2018 09:03 AM   VD25OH 22 (  L) 11/24/2015 11:00 AM   Social History   Tobacco Use  Smoking Status Former   Packs/day: 0.10   Years: 3.00   Pack years: 0.30   Types: Cigarettes  Smokeless Tobacco Former   BP Readings from Last 3 Encounters:  06/05/21 (!) 132/52  03/06/21 (!) 145/48  02/27/21 (!) 131/53   Pulse Readings from Last 3 Encounters:  06/05/21 82  03/06/21 90  02/27/21 85   Wt Readings from Last 3 Encounters:  06/05/21 221 lb (100.2 kg)  03/06/21 220 lb (99.8 kg)  02/27/21 219 lb (99.3 kg)    Assessment: Review of patient past medical history, allergies, medications, health status, including review  of consultants reports, laboratory and other test data, was performed as part of comprehensive evaluation and provision of chronic care management services.   SDOH:  (Social Determinants of Health) assessments and interventions performed:    CCM Care Plan  No Known Allergies  Medications Reviewed Today     Reviewed by Darius Bump, [facility] (Pharmacist) on 06/06/21 at 1326  Med List Status: <None>   Medication Order Taking? Sig Documenting Provider Last Dose Status Informant  Accu-Chek FastClix Lancets MISC [id number] Yes TEST TWICE A DAY  Patient taking differently: TEST ONCE A DAY   Hali Marry, MD Taking Active   aspirin 81 MG tablet [id number] Yes Take 81 mg by mouth daily. [provider] Taking Active   bisacodyl (DULCOLAX) 5 MG EC tablet [id number] Yes Take 5 mg by mouth daily as needed for moderate constipation. [provider] Taking Active   Blood Glucose Monitoring Suppl [facility] VERIO) w/Device Drucie Opitz [id number] Yes USE TO TEST ONCE DAILY. DX: E11.9 Hali Marry, MD Taking Active   Calcium Carb-Cholecalciferol (CALCIUM 600/VITAMIN D3 PO) [id number] Yes Take 1 tablet by mouth daily. [provider] Taking Active Self  Dulaglutide (TRULICITY) 1.5 LK/4.4WN SOPN [id number] Yes Inject 1.5 mg into the skin once a week. Hali Marry, MD Taking Active            Med Note [name] Jun 06, 2021  1:21 PM) Taking on Fridays  famotidine (PEPCID) 20 MG tablet [id number] Yes Take 20 mg by mouth daily. [provider] Taking Active   fish oil-omega-3 fatty acids 1000 MG capsule [id number] Yes Take 1 g by mouth daily. [provider] Taking Active   lisinopril (ZESTRIL) 5 MG tablet [id number] Yes Take 1 tablet (5 mg total) by mouth daily. Hali Marry, MD Taking Active   metFORMIN (GLUCOPHAGE-XR) 500 MG 24 hr tablet [id number] Yes TAKE 2 TABLETS BY MOUTH EVERY DAY WITH BREAKFAST Hali Marry, MD Taking  Active   Multiple Vitamins-Minerals (PRESERVISION AREDS PO) [id number] Yes Take 1 tablet by mouth in the morning and at bedtime. One tablet in the morning and one in the evening [provider] Taking Active   omeprazole (PRILOSEC) 40 MG capsule [id number] Yes TAKE 1 CAPSULE BY MOUTH EVERY DAY Hali Marry, MD Taking Active   [facility] VERIO test strip [id number] Yes USE TO TEST BLOOD SUGAR ONCE DAILY Hali Marry, MD Taking Active   polyethylene glycol (MIRALAX / GLYCOLAX) 17 g packet [id number] Yes Take 17 g by mouth daily. [provider] Taking Active   psyllium (METAMUCIL) 58.6 % packet [id number] Yes Take 1 packet by mouth daily. [provider] Taking Active   rosuvastatin (CRESTOR) 20 MG tablet [id number] Yes TAKE 1 TABLET BY MOUTH EVERY DAY Hali Marry, MD  Taking Active             Patient Active Problem List   Diagnosis Date Noted   Edema of right foot 03/06/2021   Great toe pain, right 03/06/2021   Combined forms of age-related cataract of left eye 06/02/2020   Aortic valve sclerosis 11/23/2019   Family history of lung cancer 11/09/2019   Orthopnea 11/09/2019   PND (paroxysmal nocturnal dyspnea) 11/09/2019   Esophagitis 11/09/2019   BMI 34.0-34.9,adult 07/03/2019   Microalbuminuria due to type 2 diabetes mellitus ([place]) 07/20/2016   Lumbar degenerative disc disease 05/03/2014   Status post lumbar surgery 09/03/2013   Spinal stenosis of lumbar region without neurogenic claudication 08/30/2013   Tear of meniscus of knee 05/29/2013   Osteoarthritis of left knee 05/28/2012   Controlled diabetes mellitus type 2 with complications ([place]) 93/23/5573   HYPERCHOLESTEROLEMIA, PURE 02/03/2007   BREAST MASS, LEFT 02/03/2007   POSTMENOPAUSAL STATUS 02/03/2007    Immunization History  Administered Date(s) Administered   Fluad Quad(high Dose 65+) 07/01/2019   Influenza Split 08/14/2011   Influenza Whole 07/15/2006, 08/12/2007,  08/06/2008, 09/06/2009, 06/30/2010   Influenza, High Dose Seasonal PF 07/20/2016, 06/25/2017, 07/17/2018, 07/22/2020   Influenza, Seasonal, Injecte, Preservative Fre 09/19/2012   Influenza,inj,Quad PF,6+ Mos 08/11/2013, 06/18/2014, 08/24/2015   PFIZER Comirnaty(Gray Top)Covid-19 Tri-Sucrose Vaccine 05/02/2021   PFIZER(Purple Top)SARS-COV-2 Vaccination 12/17/2019, 01/13/2020, 07/22/2020   Pneumococcal Conjugate-13 09/24/2014   Pneumococcal Polysaccharide-23 07/15/2006   Td 10/16/2003   Tdap 11/12/2013   Zoster Recombinat (Shingrix) 02/20/2017   Zoster, Live 10/15/2010    Conditions to be addressed/monitored: HLD and DMII  There are no care plans that you recently modified to display for this patient.   Medication Assistance: Application for Trulicity  medication assistance program. in process.  Anticipated assistance start date TBD.  See plan of care for additional detail.  Patient's preferred pharmacy is:  [facility], [place] [address] Phone: [phone] Fax: [phone]  [facility] [place] [address] [place] [address] Phone: [phone] Fax: [phone]  Uses pill box? Yes Pt endorses 100% compliance  Follow Up:  Patient agrees to Care Plan and Follow-up.  Plan: Telephone follow up appointment with care management team member scheduled for:  1 month  Darius Bump

## 2021-07-27 NOTE — Telephone Encounter (Signed)
Unsuccessful attempt x2 to contact patient for follow-up phone call for CCM services with pharmacist.  Will route to schedule team for reschedule.

## 2021-08-01 ENCOUNTER — Telehealth: Payer: Self-pay | Admitting: *Deleted

## 2021-08-01 NOTE — Chronic Care Management (AMB) (Signed)
  Care Management   Note  08/01/2021 Name: LAIYAH EXLINE MRN: [id number] DOB: Nov 14, 1933  Chauncey Cruel Safran is a 87 y.o. year old female who is a primary care patient of Hali Marry, MD and is actively engaged with the care management team. I reached out to [name] by phone today to assist with re-scheduling a follow up visit with the Pharmacist  Follow up plan: 2nd  Unsuccessful telephone outreach attempt made. A HIPAA compliant phone message was left for the patient providing contact information and requesting a return call.   Julian Hy, [place] Management  Direct Dial: [phone]

## 2021-08-07 NOTE — Chronic Care Management (AMB) (Signed)
  Care Management   Note  08/07/2021 Name: Diana Parsons MRN: [id number] DOB: 09-28-1934  [name] is a 87 y.o. year old female who is a primary care patient of Hali Marry, MD and is actively engaged with the care management team. I reached out to [name] by phone today to assist with re-scheduling a follow up visit with the Pharmacist  Follow up plan: We have been unable to make contact with the patient for follow up. The care management team is available to follow up with the patient after provider conversation with the patient regarding recommendation for care management engagement and subsequent re-referral to the care management team.   Julian Hy, [place] Management  Direct Dial: [phone]

## 2021-08-08 NOTE — Chronic Care Management (AMB) (Signed)
  Care Management   Note  08/08/2021 Name: LANAY ZINDA MRN: [id number] DOB: 1934-04-20  Chauncey Cruel Herrington is a 87 y.o. year old female who is a primary care patient of Hali Marry, MD and is actively engaged with the care management team. I reached out to [name] by phone today to assist with scheduling a follow up visit with the Pharmacist  Follow up plan: Telephone appointment with care management team member scheduled for: 08/11/2021  Julian Hy, [place], [place] Management  Direct Dial: [phone]

## 2021-08-11 ENCOUNTER — Ambulatory Visit (INDEPENDENT_AMBULATORY_CARE_PROVIDER_SITE_OTHER): Payer: Medicare HMO | Admitting: Pharmacist

## 2021-08-11 ENCOUNTER — Other Ambulatory Visit: Payer: Self-pay

## 2021-08-11 DIAGNOSIS — E7800 Pure hypercholesterolemia, unspecified: Secondary | ICD-10-CM

## 2021-08-11 DIAGNOSIS — E118 Type 2 diabetes mellitus with unspecified complications: Secondary | ICD-10-CM

## 2021-08-11 NOTE — Patient Instructions (Signed)
Visit Information  PATIENT GOALS:  Goals Addressed             This Visit's Progress    Medication Management       Patient Goals/Self-Care Activities Over the next 90 days, patient will:  collaborate with provider on medication access solutions  Follow Up Plan: Telephone appointment with care management team member scheduled for: 3 month        Patient verbalizes understanding of instructions provided today and agrees to view in [place].   Telephone follow up appointment with care management team member scheduled for: 3 months  Diana Parsons

## 2021-08-11 NOTE — Progress Notes (Signed)
Chronic Care Management Pharmacy Note  08/11/2021 Name:  Diana Parsons MRN:  [id number] DOB:  [birth year]  Summary: addressed HLD, and primarily DM.  Patient is approved for trulicity via [employer] through 10/14/21.   Patient requests refill of lancets.  Recommendations/Changes made from today's visit: Advised patient to pick up new forms for Trulicity 1062 re-enrollment, will leave copy at front desk for her to fill out and return.  Plan: f/u with pharmacist in 3 months  Subjective: Diana Parsons is an 87 y.o. year old female who is a primary patient of [place], Diana Kocher, Parsons.  The CCM team was consulted for assistance with disease management and care coordination needs.    Engaged with patient face to face for follow up visit in response to provider referral for pharmacy case management and/or care coordination services.   Consent to Services:  The patient was given information about Chronic Care Management services, agreed to services, and gave verbal consent prior to initiation of services.  Please see initial visit note for detailed documentation.   Patient Care Team: Diana Parsons as PCP - General Diana Parsons (Orthopedic Surgery) Diana Parsons as Pharmacist (Pharmacist)  03/06/21 Diana Planas PA-C- Patient was seen for Great toe pain of the right foot. Labs were ordered and patient started a short course of Prednisone 50 MG for 5 days. Follow up with PCP prn.   02/27/21 Diana Lecher Parsons(PCP) Patient was seen for Controlled Type 2 diabetes. Labs were ordered and a referral to [facility]was placed. Patients Dulaglutide was increased to 1.5 MG subcu weekly. Famotidine was discontinued and pt advised to follow up in 14 weeks for DM follow up.   02/03/21 Diana Lecher Parsons- Patient was seen for Colon cancer screening as a part of her Annual Wellness. Referral was placed to [facility] for colonoscopy.Follow-up with Diana Parsons as planned    12/26/20 Diana Lecher Parsons- Patient was seen for Microalbuminuria due to DM. Patient started Metformin 1000 MG daily and discontinued Dapagliflozin and Lisinopril 5 MG.   Recent consult visits:  None noted   Parsons visits:  None in previous 6 months    Objective:  Lab Results  Component Value Date   CREATININE 0.77 03/06/2021   CREATININE 0.68 11/18/2020   CREATININE 0.79 05/04/2020    Lab Results  Component Value Date   HGBA1C 6.6 (A) 06/05/2021   Last diabetic Eye exam:  Lab Results  Component Value Date/Time   HMDIABEYEEXA No Retinopathy 05/11/2020 12:00 AM    Last diabetic Foot exam:  Lab Results  Component Value Date/Time   HMDIABFOOTEX yes 02/07/2009 12:00 AM        Component Value Date/Time   CHOL 113 11/18/2020 0923   TRIG 119 11/18/2020 0923   HDL 41 (L) 11/18/2020 0923   CHOLHDL 2.8 11/18/2020 0923   VLDL 24 06/20/2016 0939   LDLCALC 51 11/18/2020 0923   LDLDIRECT 104 (H) 09/14/2011 0935    Hepatic Function Latest Ref Rng & Units 11/18/2020 10/05/2019 07/22/2018  Total Protein 6.1 - 8.1 g/dL 6.7 6.8 6.5  Albumin 3.6 - 5.1 g/dL - - -  AST 10 - 35 U/L 15 28 26   ALT 6 - 29 U/L 9 24 19   Alk Phosphatase 33 - 130 U/L - - -  Total Bilirubin 0.2 - 1.2 mg/dL 0.3 0.4 0.4  Bilirubin, Direct 0.0 - 0.3 mg/dL - - -    No results found for: TSH, FREET4  No  flowsheet data found.  Lab Results  Component Value Date/Time   VD25OH 32 07/22/2018 09:03 AM   VD25OH 22 (L) 11/24/2015 11:00 AM   Social History   Tobacco Use  Smoking Status Former   Packs/day: 0.10   Years: 3.00   Pack years: 0.30   Types: Cigarettes  Smokeless Tobacco Former   BP Readings from Last 3 Encounters:  06/05/21 (!) 132/52  03/06/21 (!) 145/48  02/27/21 (!) 131/53   Pulse Readings from Last 3 Encounters:  06/05/21 82  03/06/21 90  02/27/21 85   Wt Readings from Last 3 Encounters:  06/05/21 221 lb (100.2 kg)  03/06/21 220 lb (99.8 kg)  02/27/21 219 lb (99.3 kg)     Assessment: Review of patient past medical history, allergies, medications, health status, including review of consultants reports, laboratory and other test data, was performed as part of comprehensive evaluation and provision of chronic care management services.   SDOH:  (Social Determinants of Health) assessments and interventions performed:    CCM Care Plan  No Known Allergies  Medications Reviewed Today     Reviewed by Diana Parsons, [facility] (Pharmacist) on 06/06/21 at 1326  Med List Status: <None>   Medication Order Taking? Sig Documenting Provider Last Dose Status Informant  Accu-Chek FastClix Lancets MISC [id number] Yes TEST TWICE A DAY  Patient taking differently: TEST ONCE A DAY   Diana Parsons Taking Active   aspirin 81 MG tablet [id number] Yes Take 81 mg by mouth daily. Provider, Historical, Parsons Taking Active   bisacodyl (DULCOLAX) 5 MG EC tablet [id number] Yes Take 5 mg by mouth daily as needed for moderate constipation. Provider, Historical, Parsons Taking Active   Blood Glucose Monitoring Suppl [facility] VERIO) w/Device Drucie Opitz [id number] Yes USE TO TEST ONCE DAILY. DX: E11.9 Diana Parsons Taking Active   Calcium Carb-Cholecalciferol (CALCIUM 600/VITAMIN D3 PO) [id number] Yes Take 1 tablet by mouth daily. Provider, Historical, Parsons Taking Active Self  Dulaglutide (TRULICITY) 1.5 TD/3.2KG SOPN [id number] Yes Inject 1.5 mg into the skin once a week. Diana Parsons Taking Active            Med Note [name] Jun 06, 2021  1:21 PM) Taking on Fridays  famotidine (PEPCID) 20 MG tablet [id number] Yes Take 20 mg by mouth daily. Provider, Historical, Parsons Taking Active   fish oil-omega-3 fatty acids 1000 MG capsule [id number] Yes Take 1 g by mouth daily. Provider, Historical, Parsons Taking Active   lisinopril (ZESTRIL) 5 MG tablet [id number] Yes Take 1 tablet (5 mg total) by mouth daily. Diana Parsons Taking Active   metFORMIN (GLUCOPHAGE-XR) 500 MG  24 hr tablet [id number] Yes TAKE 2 TABLETS BY MOUTH EVERY DAY WITH BREAKFAST Diana Parsons Taking Active   Multiple Vitamins-Minerals (PRESERVISION AREDS PO) [id number] Yes Take 1 tablet by mouth in the morning and at bedtime. One tablet in the morning and one in the evening Provider, Historical, Parsons Taking Active   omeprazole (PRILOSEC) 40 MG capsule [id number] Yes TAKE 1 CAPSULE BY MOUTH EVERY DAY Diana Parsons Taking Active   [facility] VERIO test strip [id number] Yes USE TO TEST BLOOD SUGAR ONCE DAILY Diana Parsons Taking Active   polyethylene glycol (MIRALAX / GLYCOLAX) 17 g packet [id number] Yes Take 17 g by mouth daily. Provider, Historical, Parsons Taking Active   psyllium (METAMUCIL) 58.6 % packet [id number] Yes Take 1 packet by mouth daily. Provider, Historical, Parsons Taking  Active   rosuvastatin (CRESTOR) 20 MG tablet [id number] Yes TAKE 1 TABLET BY MOUTH EVERY DAY Diana Parsons Taking Active             Patient Active Problem List   Diagnosis Date Noted   Edema of right foot 03/06/2021   Great toe pain, right 03/06/2021   Combined forms of age-related cataract of left eye 06/02/2020   Aortic valve sclerosis 11/23/2019   Family history of lung cancer 11/09/2019   Orthopnea 11/09/2019   PND (paroxysmal nocturnal dyspnea) 11/09/2019   Esophagitis 11/09/2019   BMI 34.0-34.9,adult 07/03/2019   Microalbuminuria due to type 2 diabetes mellitus ([place]) 07/20/2016   Lumbar degenerative disc disease 05/03/2014   Status post lumbar surgery 09/03/2013   Spinal stenosis of lumbar region without neurogenic claudication 08/30/2013   Tear of meniscus of knee 05/29/2013   Osteoarthritis of left knee 05/28/2012   Controlled diabetes mellitus type 2 with complications ([place]) 09/32/6712   HYPERCHOLESTEROLEMIA, PURE 02/03/2007   BREAST MASS, LEFT 02/03/2007   POSTMENOPAUSAL STATUS 02/03/2007    Immunization History  Administered Date(s) Administered   Fluad  Quad(high Dose 65+) 07/01/2019   Influenza Split 08/14/2011   Influenza Whole 07/15/2006, 08/12/2007, 08/06/2008, 09/06/2009, 06/30/2010   Influenza, High Dose Seasonal PF 07/20/2016, 06/25/2017, 07/17/2018, 07/22/2020   Influenza, Seasonal, Injecte, Preservative Fre 09/19/2012   Influenza,inj,Quad PF,6+ Mos 08/11/2013, 06/18/2014, 08/24/2015   PFIZER Comirnaty(Gray Top)Covid-19 Tri-Sucrose Vaccine 05/02/2021   PFIZER(Purple Top)SARS-COV-2 Vaccination 12/17/2019, 01/13/2020, 07/22/2020   Pneumococcal Conjugate-13 09/24/2014   Pneumococcal Polysaccharide-23 07/15/2006   Td 10/16/2003   Tdap 11/12/2013   Zoster Recombinat (Shingrix) 02/20/2017   Zoster, Live 10/15/2010    Conditions to be addressed/monitored: HLD and DMII  Care Plan : Medication Management  Updates made by Diana Parsons, [place] since 08/11/2021 12:00 AM     Problem: HLD, DM      Long-Range Goal: Disease Progression Prevention   Start Date: 06/06/2021  Recent Progress: On track  Priority: High  Note:   Current Barriers:  Unable to independently afford treatment regimen  Pharmacist Clinical Goal(s):  Over the next 90 days, patient will verbalize ability to afford treatment regimen through collaboration with PharmD and provider.   Interventions: 1:1 collaboration with Diana Parsons regarding development and update of comprehensive plan of care as evidenced by provider attestation and co-signature Inter-disciplinary care team collaboration (see longitudinal plan of care) Comprehensive medication review performed; medication list updated in electronic medical record  Diabetes:  Controlled; current treatment:trulicity 4.5YK weekly, metformin XR 1020m daily; a1c 6.6  Current glucose readings: fasting glucose: 120-130s  Denies hypoglycemic/hyperglycemic symptoms  Current meal patterns: breakfast: cereal with milk & blueberries, bran or shredded wheat with coffee, sometimes boiled egg + applesauce, piece  of toast, or oatmeal with fruit; lunch: sandwich, or leftovers; dinner: chicken or fish & vegetables; snacks: potato chips, cheese puffs; drinks: unsweet iced tea (adds truvia substitute), diet peach tea, diet ginger ale, water  Current exercise: struggles, not very active. Likes to read a lot. Uses a cane to walk  Recommended continue current regimen Assessed patient finances. [facility]icity [facility] approved through 10/14/21. Advised patient to pick up new forms for 2023 re-enrollment, will leave copy at front desk for her to fill out and return. Hyperlipidemia:  Controlled; current treatment:rosuvastatin 262mdaily; LDL 51  Recommended continue current regimen   Patient Goals/Self-Care Activities Over the next 90 days, patient will:  collaborate with provider on medication  access solutions  Follow Up Plan: Telephone appointment with care management team member scheduled for: 3 month      Medication Assistance: Application for Trulicity  medication assistance program. in process.  Anticipated assistance start date TBD.  See plan of care for additional detail.  Patient's preferred pharmacy is:  [facility], [place] [address] Phone: [phone] Fax: [phone]  [facility], [place] [address] [place] [address] Phone: [phone] Fax: [phone]  Uses pill box? Yes Pt endorses 100% compliance  Follow Up:  Patient agrees to Care Plan and Follow-up.  Plan: Telephone follow up appointment with care management team member scheduled for:  3 month  Diana Parsons

## 2021-08-14 ENCOUNTER — Other Ambulatory Visit: Payer: Self-pay | Admitting: Family Medicine

## 2021-08-14 DIAGNOSIS — E118 Type 2 diabetes mellitus with unspecified complications: Secondary | ICD-10-CM | POA: Diagnosis not present

## 2021-08-14 DIAGNOSIS — E7800 Pure hypercholesterolemia, unspecified: Secondary | ICD-10-CM

## 2021-08-14 DIAGNOSIS — E119 Type 2 diabetes mellitus without complications: Secondary | ICD-10-CM

## 2021-08-14 DIAGNOSIS — K209 Esophagitis, unspecified without bleeding: Secondary | ICD-10-CM

## 2021-08-15 ENCOUNTER — Other Ambulatory Visit: Payer: Self-pay | Admitting: *Deleted

## 2021-08-15 DIAGNOSIS — E119 Type 2 diabetes mellitus without complications: Secondary | ICD-10-CM

## 2021-08-15 MED ORDER — ACCU-CHEK FASTCLIX LANCETS MISC: 12 refills | Status: DC

## 2021-08-15 NOTE — Telephone Encounter (Signed)
Famotidine rx written by historical provider.

## 2021-10-05 ENCOUNTER — Other Ambulatory Visit: Payer: Self-pay

## 2021-10-05 ENCOUNTER — Ambulatory Visit (INDEPENDENT_AMBULATORY_CARE_PROVIDER_SITE_OTHER): Payer: Medicare HMO | Admitting: Family Medicine

## 2021-10-05 ENCOUNTER — Encounter: Payer: Self-pay | Admitting: Family Medicine

## 2021-10-05 VITALS — BP 130/47 | HR 83 | Ht 66.0 in | Wt 222.0 lb

## 2021-10-05 DIAGNOSIS — E118 Type 2 diabetes mellitus with unspecified complications: Secondary | ICD-10-CM | POA: Diagnosis not present

## 2021-10-05 DIAGNOSIS — Z79899 Other long term (current) drug therapy: Secondary | ICD-10-CM | POA: Diagnosis not present

## 2021-10-05 DIAGNOSIS — K5909 Other constipation: Secondary | ICD-10-CM | POA: Insufficient documentation

## 2021-10-05 DIAGNOSIS — E119 Type 2 diabetes mellitus without complications: Secondary | ICD-10-CM | POA: Diagnosis not present

## 2021-10-05 LAB — POCT GLYCOSYLATED HEMOGLOBIN (HGB A1C): Hemoglobin A1C: 6.5 % — AB (ref 4.0–5.6)

## 2021-10-05 NOTE — Assessment & Plan Note (Addendum)
Well controlled. Continue current regimen. Follow up in  6 mo. she is really doing fantastic.  The Trulicity has been amazing and really kept her sugars well regulated she does get patient assistance for that and did bring in some additional forms for [place] to complete for renewal for next year.

## 2021-10-05 NOTE — Assessment & Plan Note (Signed)
She was on a regimen of MiraLAX daily and fiber supplementation but says it just got to the point where it was the most making her feel nauseated doing it daily she is still taking the fiber.  But still struggles with more pebble-like stools.  And sometimes can go days without a bowel movement.  We discussed maybe adding a daily magnesium supplement.  Certainly Trulicity could be contributing a little bit to the constipation as well but I do think this was going on even before then.  Or maybe even restarting the MiraLAX if needed.  Could also consider one of the prescription alternatives such as Linzess or Amitiza.  But sometimes cost can be a barrier.

## 2021-10-05 NOTE — Progress Notes (Signed)
Established Patient Office Visit  Subjective:  Patient ID: Diana Parsons, female    DOB: 01-30-1934  Age: 87 y.o. MRN: [id number]  CC:  Chief Complaint  Patient presents with   Diabetes   Hypertension    HPI KAISLEE CHAO presents for   Diabetes - no hypoglycemic events. No wounds or sores that are not healing well. No increased thirst or urination. Checking glucose at home. Taking medications as prescribed without any side effects.  Did bring in home log of blood sugars and blood pressures.  Overall blood sugars look fantastic.  I did not see any low blood sugars.  Currently on Trulicity 1.5 mg.  Also on a daily statin and metformin and an ACE inhibitor.  She thought she had an eye exam scheduled for January but is not sure.  No past medical history on file.  Past Surgical History:  Procedure Laterality Date   ABDOMINAL HYSTERECTOMY  87 yrs old   left shoulder sx s/p fall  2001   has pin in place   lumbarp spine surgery  07/2014   Dr. Lurene Shadow    Family History  Problem Relation Age of Onset   Cancer Mother        colon   Diabetes Sister    Diabetes Brother    Cancer Brother        lung cancer    Social History   Socioeconomic History   Marital status: Legally Separated    Spouse name: Not on file   Number of children: 1   Years of education: 13   Highest education level: Some college, no degree  Occupational History    Comment: Retired  Tobacco Use   Smoking status: Former    Packs/day: 0.10    Years: 3.00    Pack years: 0.30    Types: Cigarettes   Smokeless tobacco: Former  Substance and Sexual Activity   Alcohol use: No   Drug use: No   Sexual activity: Not on file  Other Topics Concern   Not on file  Social History Narrative   Lives alone. She has a son; who lives close by. She likes to read and belongs to book club.   Social Determinants of Health   Financial Resource Strain: Low Risk    Difficulty of Paying Living Expenses: Not hard at all   Food Insecurity: No Food Insecurity   Worried About Charity fundraiser in the Last Year: Never true   [place] in the Last Year: Never true  Transportation Needs: No Transportation Needs   Lack of Transportation (Medical): No   Lack of Transportation (Non-Medical): No  Physical Activity: Inactive   Days of Exercise per Week: 0 days   Minutes of Exercise per Session: 0 min  Stress: No Stress Concern Present   Feeling of Stress : Not at all  Social Connections: Moderately Integrated   Frequency of Communication with Friends and Family: More than three times a week   Frequency of Social Gatherings with Friends and Family: More than three times a week   Attends Religious Services: More than 4 times per year   Active Member of [employer] or Organizations: Yes   Attends Music therapist: More than 4 times per year   Marital Status: Separated  Intimate Partner Violence: Not At Risk   Fear of Current or Ex-Partner: No   Emotionally Abused: No   Physically Abused: No   Sexually Abused: No  Outpatient Medications Prior to Visit  Medication Sig Dispense Refill   aspirin 81 MG tablet Take 81 mg by mouth daily.     bisacodyl (DULCOLAX) 5 MG EC tablet Take 5 mg by mouth daily as needed for moderate constipation.     Blood Glucose Monitoring Suppl (ONETOUCH VERIO) w/Device KIT USE TO TEST ONCE DAILY. DX: E11.9 1 kit PRN   Calcium Carb-Cholecalciferol (CALCIUM 600/VITAMIN D3 PO) Take 1 tablet by mouth daily.     famotidine (PEPCID) 20 MG tablet TAKE 1 TABLET BY MOUTH EVERYDAY AT BEDTIME 90 tablet 4   fish oil-omega-3 fatty acids 1000 MG capsule Take 1 g by mouth daily.     Lancets (ONETOUCH DELICA PLUS [id number]) MISC USE TO TEST TWICE A DAY 200 each 12   lisinopril (ZESTRIL) 5 MG tablet Take 1 tablet (5 mg total) by mouth daily. 90 tablet 3   metFORMIN (GLUCOPHAGE-XR) 500 MG 24 hr tablet TAKE 2 TABLETS BY MOUTH EVERY DAY WITH BREAKFAST 180 tablet 1   Multiple  Vitamins-Minerals (PRESERVISION AREDS PO) Take 1 tablet by mouth in the morning and at bedtime. One tablet in the morning and one in the evening     omeprazole (PRILOSEC) 40 MG capsule TAKE 1 CAPSULE BY MOUTH EVERY DAY 90 capsule 3   ONETOUCH VERIO test strip USE TO TEST BLOOD SUGAR ONCE DAILY 100 strip 12   polyethylene glycol (MIRALAX / GLYCOLAX) 17 g packet Take 17 g by mouth daily.     rosuvastatin (CRESTOR) 20 MG tablet TAKE 1 TABLET BY MOUTH EVERY DAY 90 tablet 3   TRULICITY 1.5 GB/1.5VV SOPN INJECT 1.5 MG INTO THE SKIN ONCE A WEEK. 2 mL 3   Accu-Chek FastClix Lancets MISC TEST ONCE A DAY 204 each 12   psyllium (METAMUCIL) 58.6 % packet Take 1 packet by mouth daily.     No facility-administered medications prior to visit.    No Known Allergies  ROS Review of Systems    Objective:    Physical Exam Constitutional:      Appearance: Normal appearance. She is well-developed.  HENT:     Head: Normocephalic and atraumatic.  Cardiovascular:     Rate and Rhythm: Normal rate and regular rhythm.     Heart sounds: Normal heart sounds.  Pulmonary:     Effort: Pulmonary effort is normal.     Breath sounds: Normal breath sounds.  Skin:    General: Skin is warm and dry.  Neurological:     Mental Status: She is alert and oriented to person, place, and time.  Psychiatric:        Behavior: Behavior normal.    BP (!) 130/47    Pulse 83    Ht 5' 6"  (1.676 m)    Wt 222 lb (100.7 kg)    SpO2 96%    BMI 35.83 kg/m  Wt Readings from Last 3 Encounters:  10/05/21 222 lb (100.7 kg)  06/05/21 221 lb (100.2 kg)  03/06/21 220 lb (99.8 kg)     There are no preventive care reminders to display for this patient.   There are no preventive care reminders to display for this patient.  No results found for: TSH No results found for: WBC, HGB, HCT, MCV, PLT Lab Results  Component Value Date   NA 139 03/06/2021   K 4.4 03/06/2021   CO2 28 03/06/2021   GLUCOSE 88 03/06/2021   BUN 17  03/06/2021   CREATININE 0.77 03/06/2021   BILITOT 0.3 11/18/2020  ALKPHOS 62 06/20/2016   AST 15 11/18/2020   ALT 9 11/18/2020   PROT 6.7 11/18/2020   ALBUMIN 3.8 06/20/2016   CALCIUM 9.5 03/06/2021   Lab Results  Component Value Date   CHOL 113 11/18/2020   Lab Results  Component Value Date   HDL 41 (L) 11/18/2020   Lab Results  Component Value Date   LDLCALC 51 11/18/2020   Lab Results  Component Value Date   TRIG 119 11/18/2020   Lab Results  Component Value Date   CHOLHDL 2.8 11/18/2020   Lab Results  Component Value Date   HGBA1C 6.5 (A) 10/05/2021      Assessment & Plan:   Problem List Items Addressed This Visit       Digestive   Chronic constipation    She was on a regimen of MiraLAX daily and fiber supplementation but says it just got to the point where it was the most making her feel nauseated doing it daily she is still taking the fiber.  But still struggles with more pebble-like stools.  And sometimes can go days without a bowel movement.  We discussed maybe adding a daily magnesium supplement.  Certainly Trulicity could be contributing a little bit to the constipation as well but I do think this was going on even before then.  Or maybe even restarting the MiraLAX if needed.  Could also consider one of the prescription alternatives such as Linzess or Amitiza.  But sometimes cost can be a barrier.        Endocrine   Controlled diabetes mellitus type 2 with complications ([place])    Well controlled. Continue current regimen. Follow up in  6 mo. she is really doing fantastic.  The Trulicity has been amazing and really kept her sugars well regulated she does get patient assistance for that and did bring in some additional forms for [place] to complete for renewal for next year.      Relevant Orders   POCT glycosylated hemoglobin (Hb A1C) (Completed)   BASIC METABOLIC PANEL WITH GFR   B12   Other Visit Diagnoses     Medication management    -  Primary    Relevant Orders   BASIC METABOLIC PANEL WITH GFR   B12      Would like to screen for B12 deficiency since she has been on metformin for quite a long time.  No orders of the defined types were placed in this encounter.   Follow-up: Return in about 4 months (around 02/03/2022) for Diabetes follow-up.    [name], MD

## 2021-10-06 LAB — BASIC METABOLIC PANEL WITH GFR
BUN: 12 mg/dL (ref 7–25)
CO2: 29 mmol/L (ref 20–32)
Calcium: 9 mg/dL (ref 8.6–10.4)
Chloride: 103 mmol/L (ref 98–110)
Creat: 0.67 mg/dL (ref 0.60–0.95)
Glucose, Bld: 122 mg/dL — ABNORMAL HIGH (ref 65–99)
Potassium: 4.5 mmol/L (ref 3.5–5.3)
Sodium: 141 mmol/L (ref 135–146)
eGFR: 86 mL/min/{1.73_m2} (ref >=60)

## 2021-10-06 LAB — VITAMIN B12: Vitamin B-12: 261 pg/mL (ref 200–1100)

## 2021-10-10 NOTE — Progress Notes (Signed)
Hi Diana Parsons, your metabolic panel looks good.  B12 is normal but it is on the low end of normal so would recommend starting an over-the-counter B12 supplement daily.  Or if you want to take a B complex that would be fine as well.

## 2021-10-24 ENCOUNTER — Other Ambulatory Visit: Payer: Self-pay

## 2021-10-24 ENCOUNTER — Ambulatory Visit (INDEPENDENT_AMBULATORY_CARE_PROVIDER_SITE_OTHER): Payer: Medicare Other | Admitting: Pharmacist

## 2021-10-24 DIAGNOSIS — E7800 Pure hypercholesterolemia, unspecified: Secondary | ICD-10-CM

## 2021-10-24 DIAGNOSIS — E118 Type 2 diabetes mellitus with unspecified complications: Secondary | ICD-10-CM

## 2021-10-24 DIAGNOSIS — E1129 Type 2 diabetes mellitus with other diabetic kidney complication: Secondary | ICD-10-CM

## 2021-10-24 NOTE — Progress Notes (Signed)
Chronic Care Management Pharmacy Note  10/24/2021 Name:  Diana Parsons MRN:  [id number] DOB:  August 01, 1934  Summary: addressed HLD, HTN, and primarily DM. Reviewed and updated medication list. Patient assistance for trulicity was submitted to office 10/05/21 by patient, faxed, and we are awaiting approval from company. Patient states they provided her extra drug supply at end of year, so she has 6 injections left.  Recommendations/Changes made from today's visit: No changes at this time, await trulicity PAP approval.  Plan: f/u with pharmacist in 1 month  Subjective: Diana Parsons is an 88 y.o. year old female who is a primary patient of Metheney, Rene Kocher, MD.  The CCM team was consulted for assistance with disease management and care coordination needs.    Engaged with patient by telephone for follow up visit in response to provider referral for pharmacy case management and/or care coordination services.   Consent to Services:  The patient was given information about Chronic Care Management services, agreed to services, and gave verbal consent prior to initiation of services.  Please see initial visit note for detailed documentation.   Patient Care Team: Hali Marry, MD as PCP - General Artist Pais MD (Orthopedic Surgery) Darius Bump, [facility] as Pharmacist (Pharmacist)  03/06/21 [name] PA-C- Patient was seen for Great toe pain of the right foot. Labs were ordered and patient started a short course of Prednisone 50 MG for 5 days. Follow up with PCP prn.   02/27/21 [name] MD(PCP) Patient was seen for Controlled Type 2 diabetes. Labs were ordered and a referral to [facility]was placed. Patients Dulaglutide was increased to 1.5 MG subcu weekly. Famotidine was discontinued and pt advised to follow up in 14 weeks for DM follow up.   02/03/21 [name] MD- Patient was seen for Colon cancer screening as a part of her Annual Wellness. Referral was placed  to [facility] for colonoscopy.Follow-up with Hali Marry, MD as planned   12/26/20 [name] MD- Patient was seen for Microalbuminuria due to DM. Patient started Metformin 1000 MG daily and discontinued Dapagliflozin and Lisinopril 5 MG.   Recent consult visits:  None noted   Hospital visits:  None in previous 6 months    Objective:  Lab Results  Component Value Date   CREATININE 0.67 10/05/2021   CREATININE 0.77 03/06/2021   CREATININE 0.68 11/18/2020    Lab Results  Component Value Date   HGBA1C 6.5 (A) 10/05/2021   Last diabetic Eye exam:  Lab Results  Component Value Date/Time   HMDIABEYEEXA No Retinopathy 05/11/2020 12:00 AM    Last diabetic Foot exam:  Lab Results  Component Value Date/Time   HMDIABFOOTEX yes 02/07/2009 12:00 AM        Component Value Date/Time   CHOL 113 11/18/2020 0923   TRIG 119 11/18/2020 0923   HDL 41 (L) 11/18/2020 0923   CHOLHDL 2.8 11/18/2020 0923   VLDL 24 06/20/2016 0939   LDLCALC 51 11/18/2020 0923   LDLDIRECT 104 (H) 09/14/2011 0935    Hepatic Function Latest Ref Rng & Units 11/18/2020 10/05/2019 07/22/2018  Total Protein 6.1 - 8.1 g/dL 6.7 6.8 6.5  Albumin 3.6 - 5.1 g/dL - - -  AST 10 - 35 U/L 15 28 26   ALT 6 - 29 U/L 9 24 19   Alk Phosphatase 33 - 130 U/L - - -  Total Bilirubin 0.2 - 1.2 mg/dL 0.3 0.4 0.4  Bilirubin, Direct 0.0 - 0.3 mg/dL - - -  No results found for: TSH, FREET4  No flowsheet data found.  Lab Results  Component Value Date/Time   VD25OH 32 07/22/2018 09:03 AM   VD25OH 22 (L) 11/24/2015 11:00 AM   Social History   Tobacco Use  Smoking Status Former   Packs/day: 0.10   Years: 3.00   Pack years: 0.30   Types: Cigarettes  Smokeless Tobacco Former   BP Readings from Last 3 Encounters:  10/05/21 (!) 130/47  06/05/21 (!) 132/52  03/06/21 (!) 145/48   Pulse Readings from Last 3 Encounters:  10/05/21 83  06/05/21 82  03/06/21 90   Wt Readings from Last 3 Encounters:  10/05/21  222 lb (100.7 kg)  06/05/21 221 lb (100.2 kg)  03/06/21 220 lb (99.8 kg)    Assessment: Review of patient past medical history, allergies, medications, health status, including review of consultants reports, laboratory and other test data, was performed as part of comprehensive evaluation and provision of chronic care management services.   SDOH:  (Social Determinants of Health) assessments and interventions performed:    CCM Care Plan  No Known Allergies  Medications Reviewed Today     Reviewed by Darius Bump, [facility] (Pharmacist) on 10/24/21 at 86  Med List Status: <None>   Medication Order Taking? Sig Documenting Provider Last Dose Status Informant  aspirin 81 MG tablet [id number] Yes Take 81 mg by mouth daily. [provider] Taking Active   bisacodyl (DULCOLAX) 5 MG EC tablet [id number] Yes Take 5 mg by mouth daily as needed for moderate constipation. [provider] Taking Active   Blood Glucose Monitoring Suppl [facility] VERIO) w/Device KIT [id number] Yes USE TO TEST ONCE DAILY. DX: E11.9 Hali Marry, MD Taking Active   Calcium Carb-Cholecalciferol (CALCIUM 600/VITAMIN D3 PO) [id number] Yes Take 1 tablet by mouth daily. [provider] Taking Active Self  famotidine (PEPCID) 20 MG tablet [id number] Yes TAKE 1 TABLET BY MOUTH EVERYDAY AT BEDTIME Hali Marry, MD Taking Active   fish oil-omega-3 fatty acids 1000 MG capsule [id number] Yes Take 1 g by mouth daily. [provider] Taking Active   Lancets (ONETOUCH DELICA PLUS [id number]) [place] [id number] Yes USE TO TEST TWICE A DAY Hali Marry, MD Taking Active   lisinopril (ZESTRIL) 5 MG tablet [id number] Yes Take 1 tablet (5 mg total) by mouth daily. Hali Marry, MD Taking Active   Magnesium 200 MG TABS [id number] Yes Take 600 mg by mouth daily. [provider] Taking Active   metFORMIN (GLUCOPHAGE-XR) 500 MG 24 hr tablet [id number] Yes TAKE 2 TABLETS BY MOUTH  EVERY DAY WITH BREAKFAST Hali Marry, MD Taking Active   Methylcobalamin 5000 MCG TBDP [id number] Yes Take 1 tablet by mouth daily. [provider] Taking Active   Multiple Vitamins-Minerals (PRESERVISION AREDS PO) [id number] Yes Take 1 tablet by mouth in the morning and at bedtime. One tablet in the morning and one in the evening [provider] Taking Active   omeprazole (PRILOSEC) 40 MG capsule [id number] Yes TAKE 1 CAPSULE BY MOUTH EVERY DAY Hali Marry, MD Taking Active   [facility] VERIO test strip [id number] Yes USE TO TEST BLOOD SUGAR ONCE DAILY Hali Marry, MD Taking Active   polyethylene glycol (MIRALAX / GLYCOLAX) 17 g packet [id number] Yes Take 17 g by mouth daily. [provider] Taking Active   rosuvastatin (CRESTOR) 20 MG tablet [id number] Yes TAKE 1 TABLET BY MOUTH EVERY DAY Hali Marry, MD Taking Active   TRULICITY 1.5 EU/2.3NT  SOPN [id number] Yes INJECT 1.5 MG INTO THE SKIN ONCE A WEEK. Hali Marry, MD Taking Active             Patient Active Problem List   Diagnosis Date Noted   Chronic constipation 10/05/2021   Edema of right foot 03/06/2021   Great toe pain, right 03/06/2021   Combined forms of age-related cataract of left eye 06/02/2020   Aortic valve sclerosis 11/23/2019   Family history of lung cancer 11/09/2019   Orthopnea 11/09/2019   PND (paroxysmal nocturnal dyspnea) 11/09/2019   Esophagitis 11/09/2019   BMI 34.0-34.9,adult 07/03/2019   Microalbuminuria due to type 2 diabetes mellitus ([place]) 07/20/2016   Lumbar degenerative disc disease 05/03/2014   Status post lumbar surgery 09/03/2013   Spinal stenosis of lumbar region without neurogenic claudication 08/30/2013   Tear of meniscus of knee 05/29/2013   Osteoarthritis of left knee 05/28/2012   Controlled diabetes mellitus type 2 with complications ([place]) 71/69/6789   HYPERCHOLESTEROLEMIA, PURE 02/03/2007   BREAST MASS, LEFT 02/03/2007    POSTMENOPAUSAL STATUS 02/03/2007    Immunization History  Administered Date(s) Administered   Fluad Quad(high Dose 65+) 07/01/2019   Influenza Split 08/14/2011   Influenza Whole 07/15/2006, 08/12/2007, 08/06/2008, 09/06/2009, 06/30/2010   Influenza, High Dose Seasonal PF 07/20/2016, 06/25/2017, 07/17/2018, 07/22/2020, 08/22/2021   Influenza, Seasonal, Injecte, Preservative Fre 09/19/2012   Influenza,inj,Quad PF,6+ Mos 08/11/2013, 06/18/2014, 08/24/2015   PFIZER Comirnaty(Gray Top)Covid-19 Tri-Sucrose Vaccine 05/02/2021   PFIZER(Purple Top)SARS-COV-2 Vaccination 12/17/2019, 01/13/2020, 07/22/2020   Pfizer Covid-19 Vaccine Bivalent Booster 69yr & up 08/22/2021   Pneumococcal Conjugate-13 09/24/2014   Pneumococcal Polysaccharide-23 07/15/2006   Td 10/16/2003   Tdap 11/12/2013   Zoster Recombinat (Shingrix) 02/20/2017   Zoster, Live 10/15/2010    Conditions to be addressed/monitored: HLD and DMII  Care Plan : Medication Management  Updates made by KDarius Bump RGibsonsince 10/24/2021 12:00 AM     Problem: HLD, DM      Long-Range Goal: Disease Progression Prevention   Start Date: 06/06/2021  Recent Progress: On track  Priority: High  Note:   Current Barriers:  Unable to independently afford treatment regimen  Pharmacist Clinical Goal(s):  Over the next 30 days, patient will verbalize ability to afford treatment regimen through collaboration with PharmD and provider.   Interventions: 1:1 collaboration with MHali Marry MD regarding development and update of comprehensive plan of care as evidenced by provider attestation and co-signature Inter-disciplinary care team collaboration (see longitudinal plan of care) Comprehensive medication review performed; medication list updated in electronic medical record  Diabetes:  Controlled; current treatment:trulicity 13.8BOweekly, metformin XR 10048mdaily; a1c 6.5  Current glucose readings: fasting glucose:  120-130s  Denies hypoglycemic/hyperglycemic symptoms  Current meal patterns: breakfast: cereal with milk & blueberries, bran or shredded wheat with coffee, sometimes boiled egg + applesauce, piece of toast, or oatmeal with fruit; lunch: sandwich, or leftovers; dinner: chicken or fish & vegetables; snacks: potato chips, cheese puffs; drinks: unsweet iced tea (adds truvia substitute), diet peach tea, diet ginger ale, water  Current exercise: struggles, not very active. Likes to read a lot. Uses a cane to walk  Recommended continue current regimen Assessed patient finances. Patient submitted forms for trulicity PAP [id number] to our office 10/05/21, faxed, and we are awaiting approval status from company. Patient has 6 injections left as of our 10/24/21 discussion.  Hypertension  Controlled; current treatment: lisinopril 61m92maily  Readings at home: 120/60, 113/62  Recommended continue current regimen Hyperlipidemia:  Controlled; current  treatment:rosuvastatin 32m daily; LDL 51  Recommended continue current regimen  Patient Goals/Self-Care Activities Over the next 30 days, patient will:  collaborate with provider on medication access solutions  Follow Up Plan: Telephone appointment with care management team member scheduled for: 1 month       Medication Assistance: Application for Trulicity  medication assistance program. in process.  Anticipated assistance start date TBD.  See plan of care for additional detail.  [id number] [place] [address]Phone: [phone]: [phone]  Uses pill box? Yes Pt endorses 100% compliance  Follow Up:  Patient agrees to Care Plan and Follow-up.  Plan: Telephone follow up appointment with care management team member scheduled for:  1 month  KLarinda Buttery PharmD Clinical Pharmacist [phone]

## 2021-10-24 NOTE — Patient Instructions (Signed)
Visit Information  Thank you for taking time to visit with me today. Please don't hesitate to contact me if I can be of assistance to you before our next scheduled telephone appointment.  Following are the goals we discussed today:  Patient Goals/Self-Care Activities Over the next 30 days, patient will:  collaborate with provider on medication access solutions  Follow Up Plan: Telephone appointment with care management team member scheduled for: 1 month  Please call the care guide team at [phone] if you need to cancel or reschedule your appointment.    Patient verbalizes understanding of instructions provided today and agrees to view in [place].    Darius Bump

## 2021-10-25 DIAGNOSIS — H524 Presbyopia: Secondary | ICD-10-CM | POA: Diagnosis not present

## 2021-10-25 DIAGNOSIS — E119 Type 2 diabetes mellitus without complications: Secondary | ICD-10-CM | POA: Diagnosis not present

## 2021-10-27 DIAGNOSIS — H524 Presbyopia: Secondary | ICD-10-CM | POA: Diagnosis not present

## 2021-11-14 DIAGNOSIS — E1169 Type 2 diabetes mellitus with other specified complication: Secondary | ICD-10-CM | POA: Diagnosis not present

## 2021-11-14 DIAGNOSIS — E7800 Pure hypercholesterolemia, unspecified: Secondary | ICD-10-CM | POA: Diagnosis not present

## 2021-11-14 DIAGNOSIS — I10 Essential (primary) hypertension: Secondary | ICD-10-CM | POA: Diagnosis not present

## 2021-11-14 DIAGNOSIS — Z7984 Long term (current) use of oral hypoglycemic drugs: Secondary | ICD-10-CM

## 2021-11-23 ENCOUNTER — Other Ambulatory Visit: Payer: Self-pay

## 2021-11-23 ENCOUNTER — Ambulatory Visit (INDEPENDENT_AMBULATORY_CARE_PROVIDER_SITE_OTHER): Payer: Medicare Other | Admitting: Pharmacist

## 2021-11-23 DIAGNOSIS — E7800 Pure hypercholesterolemia, unspecified: Secondary | ICD-10-CM

## 2021-11-23 DIAGNOSIS — E113213 Type 2 diabetes mellitus with mild nonproliferative diabetic retinopathy with macular edema, bilateral: Secondary | ICD-10-CM | POA: Diagnosis not present

## 2021-11-23 DIAGNOSIS — Z7984 Long term (current) use of oral hypoglycemic drugs: Secondary | ICD-10-CM | POA: Diagnosis not present

## 2021-11-23 DIAGNOSIS — E118 Type 2 diabetes mellitus with unspecified complications: Secondary | ICD-10-CM

## 2021-11-23 DIAGNOSIS — Z961 Presence of intraocular lens: Secondary | ICD-10-CM | POA: Diagnosis not present

## 2021-11-23 DIAGNOSIS — H353231 Exudative age-related macular degeneration, bilateral, with active choroidal neovascularization: Secondary | ICD-10-CM | POA: Diagnosis not present

## 2021-11-23 DIAGNOSIS — Z7985 Long-term (current) use of injectable non-insulin antidiabetic drugs: Secondary | ICD-10-CM | POA: Diagnosis not present

## 2021-11-23 NOTE — Patient Instructions (Signed)
Visit Information  Thank you for taking time to visit with me today. Please don't hesitate to contact me if I can be of assistance to you before our next scheduled telephone appointment.  Following are the goals we discussed today:   Patient Goals/Self-Care Activities Over the next 90 days, patient will:  Take medications as prescribed  Follow Up Plan: Telephone appointment with care management team member scheduled for: 3-5 months  Please call the care guide team at [phone] if you need to cancel or reschedule your appointment.    Patient verbalizes understanding of instructions and care plan provided today and agrees to view in [place]. Active MyChart status confirmed with patient.    Diana Parsons

## 2021-11-23 NOTE — Progress Notes (Signed)
Chronic Care Management Pharmacy Note  11/23/2021 Name:  Diana Parsons MRN:  [id number] DOB:  02/22/1934  Summary: addressed HLD, HTN, and primarily DM. Patient assistance for trulicity was approved for 2023, and patient received another shipment of medication supply!  Recommendations/Changes made from today's visit: No changes at this time, patient doing well.  Plan: f/u with pharmacist in 3-5 months  Subjective: Diana Parsons is an 88 y.o. year old female who is a primary patient of Metheney, Rene Kocher, MD.  The CCM team was consulted for assistance with disease management and care coordination needs.    Engaged with patient by telephone for follow up visit in response to provider referral for pharmacy case management and/or care coordination services.   Consent to Services:  The patient was given information about Chronic Care Management services, agreed to services, and gave verbal consent prior to initiation of services.  Please see initial visit note for detailed documentation.   Patient Care Team: Hali Marry, MD as PCP - General Artist Pais MD (Orthopedic Surgery) Darius Bump, [facility] as Pharmacist (Pharmacist)  03/06/21 [name] PA-C- Patient was seen for Great toe pain of the right foot. Labs were ordered and patient started a short course of Prednisone 50 MG for 5 days. Follow up with PCP prn.   02/27/21 [name] MD(PCP) Patient was seen for Controlled Type 2 diabetes. Labs were ordered and a referral to [facility]was placed. Patients Dulaglutide was increased to 1.5 MG subcu weekly. Famotidine was discontinued and pt advised to follow up in 14 weeks for DM follow up.   02/03/21 [name] MD- Patient was seen for Colon cancer screening as a part of her Annual Wellness. Referral was placed to [facility] for colonoscopy.Follow-up with Hali Marry, MD as planned   12/26/20 [name] MD- Patient was seen for Microalbuminuria due  to DM. Patient started Metformin 1000 MG daily and discontinued Dapagliflozin and Lisinopril 5 MG.   Recent consult visits:  None noted   Hospital visits:  None in previous 6 months    Objective:  Lab Results  Component Value Date   CREATININE 0.67 10/05/2021   CREATININE 0.77 03/06/2021   CREATININE 0.68 11/18/2020    Lab Results  Component Value Date   HGBA1C 6.5 (A) 10/05/2021   Last diabetic Eye exam:  Lab Results  Component Value Date/Time   HMDIABEYEEXA No Retinopathy 05/11/2020 12:00 AM    Last diabetic Foot exam:  Lab Results  Component Value Date/Time   HMDIABFOOTEX yes 02/07/2009 12:00 AM        Component Value Date/Time   CHOL 113 11/18/2020 0923   TRIG 119 11/18/2020 0923   HDL 41 (L) 11/18/2020 0923   CHOLHDL 2.8 11/18/2020 0923   VLDL 24 06/20/2016 0939   LDLCALC 51 11/18/2020 0923   LDLDIRECT 104 (H) 09/14/2011 0935    Hepatic Function Latest Ref Rng & Units 11/18/2020 10/05/2019 07/22/2018  Total Protein 6.1 - 8.1 g/dL 6.7 6.8 6.5  Albumin 3.6 - 5.1 g/dL - - -  AST 10 - 35 U/L 15 28 26   ALT 6 - 29 U/L 9 24 19   Alk Phosphatase 33 - 130 U/L - - -  Total Bilirubin 0.2 - 1.2 mg/dL 0.3 0.4 0.4  Bilirubin, Direct 0.0 - 0.3 mg/dL - - -    No results found for: TSH, FREET4  No flowsheet data found.  Lab Results  Component Value Date/Time   VD25OH 32 07/22/2018 09:03 AM  VD25OH 22 (L) 11/24/2015 11:00 AM   Social History   Tobacco Use  Smoking Status Former   Packs/day: 0.10   Years: 3.00   Pack years: 0.30   Types: Cigarettes  Smokeless Tobacco Former   BP Readings from Last 3 Encounters:  10/05/21 (!) 130/47  06/05/21 (!) 132/52  03/06/21 (!) 145/48   Pulse Readings from Last 3 Encounters:  10/05/21 83  06/05/21 82  03/06/21 90   Wt Readings from Last 3 Encounters:  10/05/21 222 lb (100.7 kg)  06/05/21 221 lb (100.2 kg)  03/06/21 220 lb (99.8 kg)    Assessment: Review of patient past medical history, allergies,  medications, health status, including review of consultants reports, laboratory and other test data, was performed as part of comprehensive evaluation and provision of chronic care management services.   SDOH:  (Social Determinants of Health) assessments and interventions performed:    CCM Care Plan  No Known Allergies  Medications Reviewed Today     Reviewed by Darius Bump, [facility] (Pharmacist) on 11/23/21 at 1141  Med List Status: <None>   Medication Order Taking? Sig Documenting Provider Last Dose Status Informant  aspirin 81 MG tablet [id number] Yes Take 81 mg by mouth daily. [provider] Taking Active   bisacodyl (DULCOLAX) 5 MG EC tablet [id number] Yes Take 5 mg by mouth daily as needed for moderate constipation. [provider] Taking Active   Blood Glucose Monitoring Suppl [facility] VERIO) w/Device KIT [id number] Yes USE TO TEST ONCE DAILY. DX: E11.9 Hali Marry, MD Taking Active   Calcium Carb-Cholecalciferol (CALCIUM 600/VITAMIN D3 PO) [id number] Yes Take 1 tablet by mouth daily. [provider] Taking Active Self  famotidine (PEPCID) 20 MG tablet [id number] Yes TAKE 1 TABLET BY MOUTH EVERYDAY AT BEDTIME Hali Marry, MD Taking Active   fish oil-omega-3 fatty acids 1000 MG capsule [id number] Yes Take 1 g by mouth daily. [provider] Taking Active   Lancets (ONETOUCH DELICA PLUS WVPXTG62I) [place] [id number] Yes USE TO TEST TWICE A DAY Hali Marry, MD Taking Active   lisinopril (ZESTRIL) 5 MG tablet [id number] Yes Take 1 tablet (5 mg total) by mouth daily. Hali Marry, MD Taking Active   Magnesium 200 MG TABS [id number] Yes Take 600 mg by mouth daily. [provider] Taking Active   metFORMIN (GLUCOPHAGE-XR) 500 MG 24 hr tablet [id number] Yes TAKE 2 TABLETS BY MOUTH EVERY DAY WITH BREAKFAST Hali Marry, MD Taking Active   Methylcobalamin 5000 MCG TBDP [id number] Yes Take 1 tablet by mouth daily.  [provider] Taking Active   Multiple Vitamins-Minerals (PRESERVISION AREDS PO) [id number]  Take 1 tablet by mouth in the morning and at bedtime. One tablet in the morning and one in the evening [provider]  Active   omeprazole (PRILOSEC) 40 MG capsule [id number]  TAKE 1 CAPSULE BY MOUTH EVERY DAY Hali Marry, MD  Active   [facility] VERIO test strip [id number]  USE TO TEST BLOOD SUGAR ONCE DAILY Hali Marry, MD  Active   polyethylene glycol (MIRALAX / GLYCOLAX) 17 g packet [id number] Yes Take 17 g by mouth daily. [provider] Taking Active   rosuvastatin (CRESTOR) 20 MG tablet [id number] Yes TAKE 1 TABLET BY MOUTH EVERY DAY Hali Marry, MD Taking Active   TRULICITY 1.5 KN/3.9JQ SOPN [id number] Yes INJECT 1.5 MG INTO THE SKIN ONCE A WEEK. Hali Marry, MD Taking Active  Patient Active Problem List   Diagnosis Date Noted   Chronic constipation 10/05/2021   Edema of right foot 03/06/2021   Great toe pain, right 03/06/2021   Combined forms of age-related cataract of left eye 06/02/2020   Aortic valve sclerosis 11/23/2019   Family history of lung cancer 11/09/2019   Orthopnea 11/09/2019   PND (paroxysmal nocturnal dyspnea) 11/09/2019   Esophagitis 11/09/2019   BMI 34.0-34.9,adult 07/03/2019   Microalbuminuria due to type 2 diabetes mellitus ([place]) 07/20/2016   Lumbar degenerative disc disease 05/03/2014   Status post lumbar surgery 09/03/2013   Spinal stenosis of lumbar region without neurogenic claudication 08/30/2013   Tear of meniscus of knee 05/29/2013   Osteoarthritis of left knee 05/28/2012   Controlled diabetes mellitus type 2 with complications ([place]) 93/79/0240   HYPERCHOLESTEROLEMIA, PURE 02/03/2007   BREAST MASS, LEFT 02/03/2007   POSTMENOPAUSAL STATUS 02/03/2007    Immunization History  Administered Date(s) Administered   Fluad Quad(high Dose 65+) 07/01/2019   Influenza Split 08/14/2011    Influenza Whole 07/15/2006, 08/12/2007, 08/06/2008, 09/06/2009, 06/30/2010   Influenza, High Dose Seasonal PF 07/20/2016, 06/25/2017, 07/17/2018, 07/22/2020, 08/22/2021   Influenza, Seasonal, Injecte, Preservative Fre 09/19/2012   Influenza,inj,Quad PF,6+ Mos 08/11/2013, 06/18/2014, 08/24/2015   PFIZER Comirnaty(Gray Top)Covid-19 Tri-Sucrose Vaccine 05/02/2021   PFIZER(Purple Top)SARS-COV-2 Vaccination 12/17/2019, 01/13/2020, 07/22/2020   Pfizer Covid-19 Vaccine Bivalent Booster 37yr & up 08/22/2021   Pneumococcal Conjugate-13 09/24/2014   Pneumococcal Polysaccharide-23 07/15/2006   Td 10/16/2003   Tdap 11/12/2013   Zoster Recombinat (Shingrix) 02/20/2017   Zoster, Live 10/15/2010    Conditions to be addressed/monitored: HLD and DMII  Care Plan : Medication Management  Updates made by KDarius Bump RPH since 11/23/2021 12:00 AM     Problem: HLD, DM      Long-Range Goal: Disease Progression Prevention   Start Date: 06/06/2021  Recent Progress: On track  Priority: High  Note:   Current Barriers:  Unable to independently afford treatment regimen  Pharmacist Clinical Goal(s):  Over the next 90 days, patient will verbalize ability to afford treatment regimen through collaboration with PharmD and provider.   Interventions: 1:1 collaboration with MHali Marry MD regarding development and update of comprehensive plan of care as evidenced by provider attestation and co-signature Inter-disciplinary care team collaboration (see longitudinal plan of care) Comprehensive medication review performed; medication list updated in electronic medical record  Diabetes:  Controlled; current treatment:trulicity 19.7DZweekly, metformin XR 10071mdaily; a1c 6.5  Current glucose readings: fasting glucose: 120-130s  Denies hypoglycemic/hyperglycemic symptoms  Current meal patterns: breakfast: cereal with milk & blueberries, bran or shredded wheat with coffee, sometimes boiled egg +  applesauce, piece of toast, or oatmeal with fruit; lunch: sandwich, or leftovers; dinner: chicken or fish & vegetables; snacks: potato chips, cheese puffs; drinks: unsweet iced tea (adds truvia substitute), diet peach tea, diet ginger ale, water  Current exercise: struggles, not very active. Likes to read a lot. Uses a cane to walk  Recommended continue current regimen Assessed patient finances. Patient submitted forms for trulicity PAP [id number] and has received a shipment of medication as of our 11/23/21 discussions.  Hypertension  Controlled; current treatment: lisinopril 68m29maily  Readings at home: 120/60, 113/62  Recommended continue current regimen Hyperlipidemia:  Controlled; current treatment:rosuvastatin 68m22mily; LDL 51  Recommended continue current regimen  Patient Goals/Self-Care Activities Over the next 90 days, patient will:  Take medications as prescribed  Follow Up Plan: Telephone appointment with care management team member scheduled for: 3-5 months  Medication Assistance: Application for Trulicity  medication assistance program. in process.  Anticipated assistance start date TBD.  See plan of care for additional detail.  Patient's preferred pharmacy is:  [facility] [address] [place] [address] Phone: [phone] Fax: [phone]   Uses pill box? Yes Pt endorses 100% compliance  Follow Up:  Patient agrees to Care Plan and Follow-up.  Plan: Telephone follow up appointment with care management team member scheduled for:  3-5 month  Larinda Buttery, PharmD Clinical Pharmacist [facility] [phone]

## 2021-11-30 DIAGNOSIS — E113299 Type 2 diabetes mellitus with mild nonproliferative diabetic retinopathy without macular edema, unspecified eye: Secondary | ICD-10-CM | POA: Diagnosis not present

## 2021-11-30 DIAGNOSIS — H353231 Exudative age-related macular degeneration, bilateral, with active choroidal neovascularization: Secondary | ICD-10-CM | POA: Diagnosis not present

## 2021-11-30 DIAGNOSIS — Z961 Presence of intraocular lens: Secondary | ICD-10-CM | POA: Diagnosis not present

## 2021-11-30 DIAGNOSIS — Z7984 Long term (current) use of oral hypoglycemic drugs: Secondary | ICD-10-CM | POA: Diagnosis not present

## 2021-12-11 ENCOUNTER — Telehealth: Payer: Self-pay | Admitting: Family Medicine

## 2021-12-11 NOTE — Telephone Encounter (Signed)
Pt dropped off list of medications that need to be sent to a new pharmacy. Put in your box.

## 2021-12-12 DIAGNOSIS — E7800 Pure hypercholesterolemia, unspecified: Secondary | ICD-10-CM

## 2021-12-12 DIAGNOSIS — E118 Type 2 diabetes mellitus with unspecified complications: Secondary | ICD-10-CM

## 2021-12-13 ENCOUNTER — Other Ambulatory Visit: Payer: Self-pay

## 2021-12-13 DIAGNOSIS — E118 Type 2 diabetes mellitus with unspecified complications: Secondary | ICD-10-CM

## 2021-12-13 DIAGNOSIS — E785 Hyperlipidemia, unspecified: Secondary | ICD-10-CM

## 2021-12-13 DIAGNOSIS — K209 Esophagitis, unspecified without bleeding: Secondary | ICD-10-CM

## 2021-12-13 MED ORDER — ROSUVASTATIN CALCIUM 20 MG PO TABS: 20.0000 mg | ORAL_TABLET | Freq: Every day | ORAL | 3 refills | Status: DC

## 2021-12-13 MED ORDER — METFORMIN HCL ER 500 MG PO TB24: ORAL_TABLET | ORAL | 1 refills | Status: DC

## 2021-12-13 MED ORDER — LISINOPRIL 5 MG PO TABS: 5.0000 mg | ORAL_TABLET | Freq: Every day | ORAL | 3 refills | Status: DC

## 2021-12-13 MED ORDER — ONETOUCH VERIO VI STRP: ORAL_STRIP | 12 refills | Status: DC

## 2021-12-13 MED ORDER — OMEPRAZOLE 40 MG PO CPDR: 40.0000 mg | DELAYED_RELEASE_CAPSULE | Freq: Every day | ORAL | 3 refills | Status: DC

## 2021-12-13 MED ORDER — FAMOTIDINE 20 MG PO TABS: 20.0000 mg | ORAL_TABLET | Freq: Every day | ORAL | 3 refills | Status: DC

## 2021-12-15 ENCOUNTER — Other Ambulatory Visit: Payer: Self-pay | Admitting: Family Medicine

## 2021-12-15 DIAGNOSIS — E118 Type 2 diabetes mellitus with unspecified complications: Secondary | ICD-10-CM

## 2021-12-19 IMAGING — DX DG TOE GREAT 2+V*R*
3 series · 3 of 3 positions shown · non-contrast
Comparison: No prior.

CLINICAL DATA: Toe pain.

EXAM:
RIGHT GREAT TOE

[toe ap]
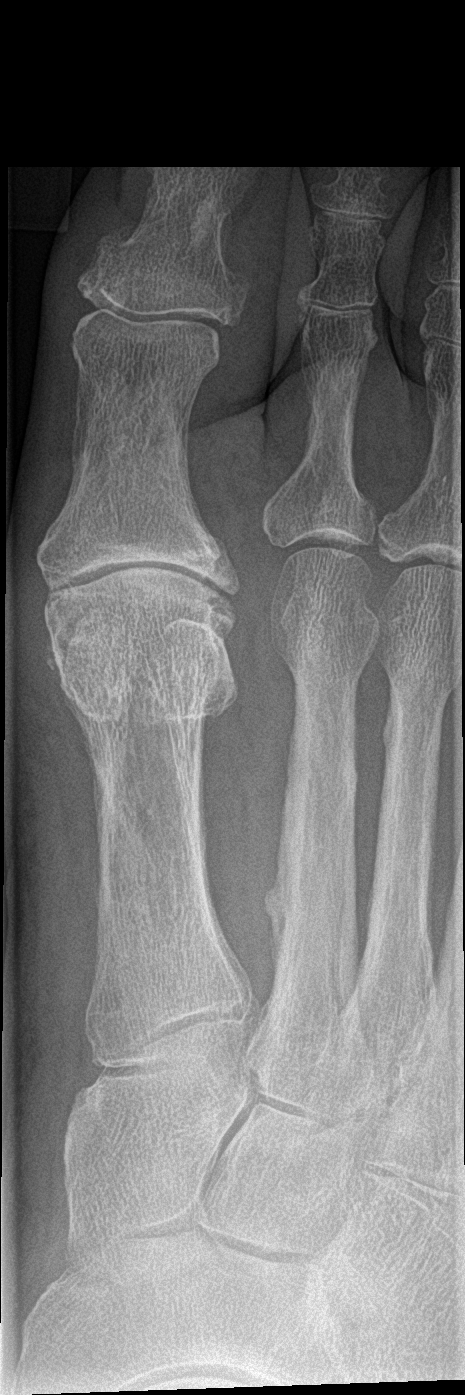

[toe lat]
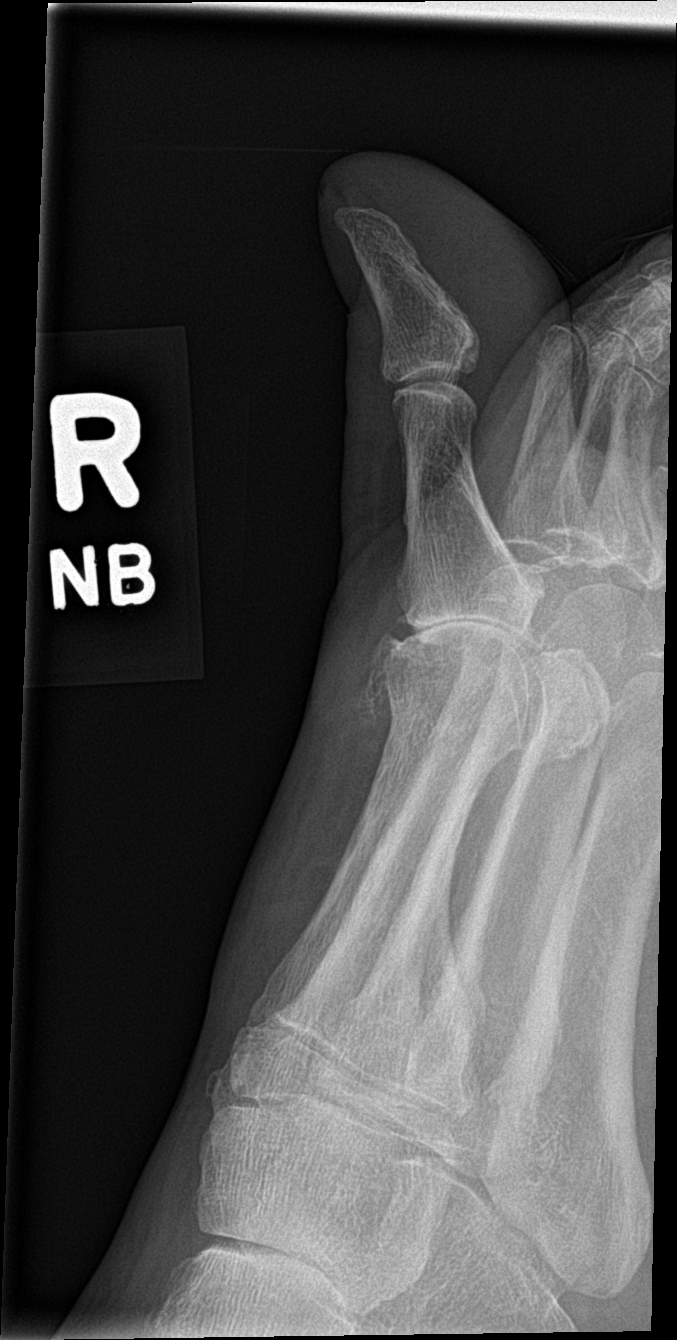

[toe obl]
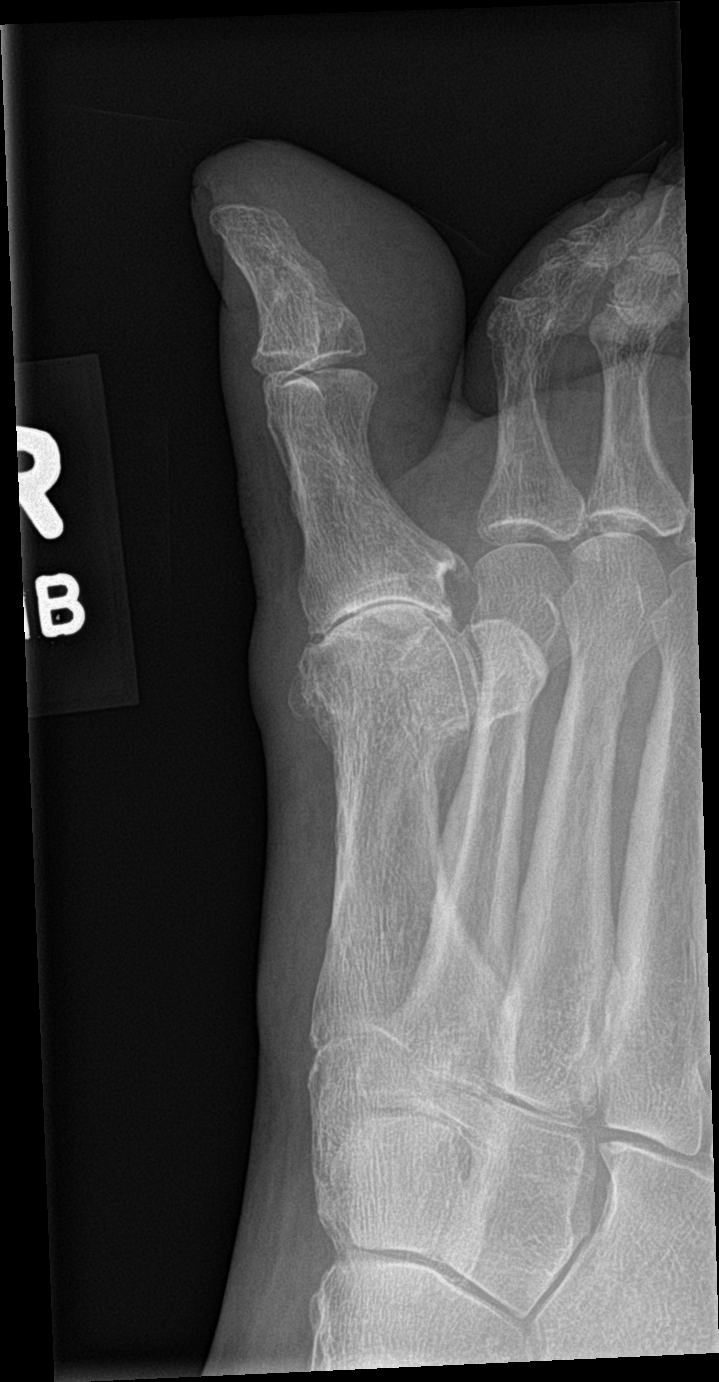

[3 of 3 positions shown; findings below may reference images not displayed]

FINDINGS: Diffuse degenerative change. Degenerative changes most prominent the
first MTP joint. Erosive changes about the base of the proximal
phalanx of the right great toe cannot be excluded. The possibility
of gout should be considered.
IMPRESSION: Diffuse degenerative change, most prominent about the first MTP
joint. Erosive changes of the base of the proximal phalanx of the
right great toe cannot be excluded. The possibility of gout should
be considered.

## 2021-12-21 DIAGNOSIS — Z7984 Long term (current) use of oral hypoglycemic drugs: Secondary | ICD-10-CM | POA: Diagnosis not present

## 2021-12-21 DIAGNOSIS — H353231 Exudative age-related macular degeneration, bilateral, with active choroidal neovascularization: Secondary | ICD-10-CM | POA: Diagnosis not present

## 2021-12-21 DIAGNOSIS — Z961 Presence of intraocular lens: Secondary | ICD-10-CM | POA: Diagnosis not present

## 2021-12-21 DIAGNOSIS — E113299 Type 2 diabetes mellitus with mild nonproliferative diabetic retinopathy without macular edema, unspecified eye: Secondary | ICD-10-CM | POA: Diagnosis not present

## 2022-01-11 ENCOUNTER — Other Ambulatory Visit: Payer: Self-pay | Admitting: Family Medicine

## 2022-01-11 DIAGNOSIS — E785 Hyperlipidemia, unspecified: Secondary | ICD-10-CM

## 2022-01-25 DIAGNOSIS — Z7984 Long term (current) use of oral hypoglycemic drugs: Secondary | ICD-10-CM | POA: Diagnosis not present

## 2022-01-25 DIAGNOSIS — E113293 Type 2 diabetes mellitus with mild nonproliferative diabetic retinopathy without macular edema, bilateral: Secondary | ICD-10-CM | POA: Diagnosis not present

## 2022-01-25 DIAGNOSIS — E113299 Type 2 diabetes mellitus with mild nonproliferative diabetic retinopathy without macular edema, unspecified eye: Secondary | ICD-10-CM | POA: Diagnosis not present

## 2022-01-25 DIAGNOSIS — Z961 Presence of intraocular lens: Secondary | ICD-10-CM | POA: Diagnosis not present

## 2022-01-25 DIAGNOSIS — H353211 Exudative age-related macular degeneration, right eye, with active choroidal neovascularization: Secondary | ICD-10-CM | POA: Diagnosis not present

## 2022-01-25 DIAGNOSIS — H353231 Exudative age-related macular degeneration, bilateral, with active choroidal neovascularization: Secondary | ICD-10-CM | POA: Diagnosis not present

## 2022-02-05 ENCOUNTER — Encounter: Payer: Self-pay | Admitting: Family Medicine

## 2022-02-05 ENCOUNTER — Ambulatory Visit (INDEPENDENT_AMBULATORY_CARE_PROVIDER_SITE_OTHER): Payer: Medicare Other | Admitting: Family Medicine

## 2022-02-05 VITALS — BP 133/46 | HR 88 | Ht 66.0 in | Wt 220.0 lb

## 2022-02-05 DIAGNOSIS — R809 Proteinuria, unspecified: Secondary | ICD-10-CM

## 2022-02-05 DIAGNOSIS — E118 Type 2 diabetes mellitus with unspecified complications: Secondary | ICD-10-CM | POA: Diagnosis not present

## 2022-02-05 DIAGNOSIS — E1129 Type 2 diabetes mellitus with other diabetic kidney complication: Secondary | ICD-10-CM | POA: Diagnosis not present

## 2022-02-05 DIAGNOSIS — E538 Deficiency of other specified B group vitamins: Secondary | ICD-10-CM

## 2022-02-05 DIAGNOSIS — D229 Melanocytic nevi, unspecified: Secondary | ICD-10-CM

## 2022-02-05 DIAGNOSIS — R6889 Other general symptoms and signs: Secondary | ICD-10-CM

## 2022-02-05 MED ORDER — TRULICITY 3 MG/0.5ML ~~LOC~~ SOAJ: 3.0000 mg | SUBCUTANEOUS | 1 refills | Status: DC

## 2022-02-05 NOTE — Assessment & Plan Note (Signed)
Just 5 mg of lisinopril since blood pressure seem to be on the lower end. ?

## 2022-02-05 NOTE — Progress Notes (Signed)
? ?Established Patient Office Visit ? ?Subjective   ?Patient ID: Diana Parsons, female    DOB: [birth year]  Age: 88 y.o. MRN: [id number] ? ?Chief Complaint  ?Patient presents with  ? Diabetes  ? Hypertension  ? ? ?HPI ? ?Diabetes with microalbuminuria.- no hypoglycemic events. No wounds or sores that are not healing well. No increased thirst or urination. Checking glucose at home. Taking medications as prescribed without any side effects.  Currently on Trulicity 1.5 mg.  Taking a daily statin.  Also on metformin and ACE. She is down 2 lbs.   ? ?In December we checked her B12 and it was normal but on the low end of normal so encouraged her to start some over-the-counter B12 supplement.  Plan to recheck levels today. ? ?Brought in home blood pressure log today.  Blood pressures look phenomenal most of them are running in the mid 90s to about 993 systolic and typically in the 71I and 96V diastolic. ? ?She also reports that at night she feels like her throat gets "clogged".  She has been using little bit of saline mist and switch to something that is little bit more of a true spray it did seem to be helpful at least temporarily.  She says sometimes it wakes her up at night.  She has not tried any other sprays. ? ?He also has a mole on her mid chest that she would like me to look at she says it started maybe 8 years ago.  She does not think it is changed in size recently but it is a lot darker than the rest of her moles ? ? ? ?ROS ? ?  ?Objective:  ?  ? ?BP (!) 133/46   Pulse 88   Ht '5\' 6"'$  (1.676 m)   Wt 220 lb (99.8 kg)   SpO2 96%   BMI 35.51 kg/m?  ? ? ?Physical Exam ?Vitals and nursing note reviewed.  ?Constitutional:   ?   Appearance: She is well-developed.  ?HENT:  ?   Head: Normocephalic and atraumatic.  ?Eyes:  ?   General:     ?   Left eye: No discharge.  ?Cardiovascular:  ?   Rate and Rhythm: Normal rate and regular rhythm.  ?   Heart sounds: Normal heart sounds.  ?Pulmonary:  ?   Effort: Pulmonary effort is  normal.  ?   Breath sounds: Normal breath sounds.  ?Skin: ?   General: Skin is warm and dry.  ?Neurological:  ?   Mental Status: She is alert and oriented to person, place, and time.  ?Psychiatric:     ?   Behavior: Behavior normal.  ? ? ? ?No results found for any visits on 02/05/22. ? ?Last hemoglobin A1c ?Lab Results  ?Component Value Date  ? HGBA1C 6.5 (A) 10/05/2021  ? ?  ? ?The ASCVD Risk score (Arnett DK, et al., 2019) failed to calculate for the following reasons: ?  The 2019 ASCVD risk score is only valid for ages 72 to 67 ? ?  ?Assessment & Plan:  ? ?Problem List Items Addressed This Visit   ? ?  ? Endocrine  ? Microalbuminuria due to type 2 diabetes mellitus ([place])  ?  Just 5 mg of lisinopril since blood pressure seem to be on the lower end. ? ?  ?  ? Relevant Medications  ? Dulaglutide (TRULICITY) 3 EL/3.8BO SOPN (Start on 03/12/2022)  ? Other Relevant Orders  ? COMPLETE METABOLIC PANEL WITH GFR  ?  Lipid Panel w/reflex Direct LDL  ? Controlled diabetes mellitus type 2 with complications ([place]) - Primary  ?  A1c looks phenomenal today at 5.2.  We discussed going up on her Trulicity she still has about 7 weeks left of 1.5 mg encouraged her to go ahead and use that up.  When she switches to the 3 mg can I have her cut her metformin in half and just take 1 tab daily instead of 2.  I will see her back in 4 months if she still doing well at that time then the plan will be to hopefully discontinue her metformin completely.  Home blood glucose log looks great.  No hypoglycemic events. ? ?  ?  ? Relevant Medications  ? Dulaglutide (TRULICITY) 3 NT/6.1WE SOPN (Start on 03/12/2022)  ? Other Relevant Orders  ? B12  ? COMPLETE METABOLIC PANEL WITH GFR  ? Lipid Panel w/reflex Direct LDL  ? ?Other Visit Diagnoses   ? ? Low serum vitamin B12      ? Relevant Orders  ? B12  ? Throat congestion      ? Atypical nevus      ? Relevant Orders  ? Ambulatory referral to Dermatology  ? ?  ? ? ?Throat Congestion-For the congestion and  drainage in your throat I like for you to try either Flonase or Nasonex for 2 to 3 weeks to see if it is helpful.  Also consider getting a dust mite cover for your pillowcase.  If this is not helpful then you can switch to Astelin or Astepro which is a nasal antihistamine.  These are all found over-the-counter.  If that is not helpful after 2 to 3 weeks then please let me know and we can refer you to ENT for further work-up.  Also recommend running a humidifier at night. ? ?Low vitamin B12-interestingly says she was diagnosed with B12 deficiency decades ago after her hysterectomy she was actually on B12 injections so well before she started the metformin so it may not be a medication side effect.  Plan to recheck her levels. ? ?Atypical nevus-it is definitely much darker and significantly raised compared to her other moles.  Will refer to dermatology for further evaluation. ? ?Return in about 4 months (around 06/07/2022) for Diabetes follow-up.  ? ? ?[name], MD ? ?

## 2022-02-05 NOTE — Patient Instructions (Signed)
Once you complete your Trulicity 1.5 mg dosing over the next 7 weeks then okay to increase to the 3 mg. ?When you increase to 3 mg on the Trulicity, you can decrease the metformin to once a day in the morning and drop off the second pill. ? ?For the congestion and drainage in your throat I like for you to try either Flonase or Nasonex for 2 to 3 weeks to see if it is helpful.  Also consider getting a dust mite cover for your pillowcase.  If this is not helpful then you can switch to Astelin or Astepro which is a nasal antihistamine.  These are all found over-the-counter.  If that is not helpful after 2 to 3 weeks then please let me know and we can refer you to ENT for further work-up. ?

## 2022-02-05 NOTE — Assessment & Plan Note (Signed)
A1c looks phenomenal today at 5.2.  We discussed going up on her Trulicity she still has about 7 weeks left of 1.5 mg encouraged her to go ahead and use that up.  When she switches to the 3 mg can I have her cut her metformin in half and just take 1 tab daily instead of 2.  I will see her back in 4 months if she still doing well at that time then the plan will be to hopefully discontinue her metformin completely.  Home blood glucose log looks great.  No hypoglycemic events. ?

## 2022-02-06 DIAGNOSIS — E1129 Type 2 diabetes mellitus with other diabetic kidney complication: Secondary | ICD-10-CM | POA: Diagnosis not present

## 2022-02-06 DIAGNOSIS — R809 Proteinuria, unspecified: Secondary | ICD-10-CM | POA: Diagnosis not present

## 2022-02-06 DIAGNOSIS — E538 Deficiency of other specified B group vitamins: Secondary | ICD-10-CM | POA: Diagnosis not present

## 2022-02-06 DIAGNOSIS — E118 Type 2 diabetes mellitus with unspecified complications: Secondary | ICD-10-CM | POA: Diagnosis not present

## 2022-02-06 LAB — COMPLETE METABOLIC PANEL WITHOUT GFR
AG Ratio: 1.7 (calc) (ref 1.0–2.5)
ALT: 9 U/L (ref 6–29)
AST: 14 U/L (ref 10–35)
Albumin: 4.2 g/dL (ref 3.6–5.1)
Alkaline phosphatase (APISO): 77 U/L (ref 37–153)
BUN: 14 mg/dL (ref 7–25)
CO2: 30 mmol/L (ref 20–32)
Calcium: 9.3 mg/dL (ref 8.6–10.4)
Chloride: 101 mmol/L (ref 98–110)
Creat: 0.72 mg/dL (ref 0.60–0.95)
Globulin: 2.5 g/dL (ref 1.9–3.7)
Glucose, Bld: 115 mg/dL — ABNORMAL HIGH (ref 65–99)
Potassium: 4.5 mmol/L (ref 3.5–5.3)
Sodium: 139 mmol/L (ref 135–146)
Total Bilirubin: 0.4 mg/dL (ref 0.2–1.2)
Total Protein: 6.7 g/dL (ref 6.1–8.1)
eGFR: 82 mL/min/1.73m2

## 2022-02-06 LAB — LIPID PANEL W/REFLEX DIRECT LDL
Cholesterol: 112 mg/dL
HDL: 44 mg/dL — ABNORMAL LOW
LDL Cholesterol (Calc): 46 mg/dL
Non-HDL Cholesterol (Calc): 68 mg/dL
Total CHOL/HDL Ratio: 2.5 (calc)
Triglycerides: 134 mg/dL

## 2022-02-06 LAB — VITAMIN B12: Vitamin B-12: 472 pg/mL (ref 200–1100)

## 2022-02-07 ENCOUNTER — Other Ambulatory Visit: Payer: Self-pay | Admitting: *Deleted

## 2022-02-07 DIAGNOSIS — E118 Type 2 diabetes mellitus with unspecified complications: Secondary | ICD-10-CM

## 2022-02-07 NOTE — Progress Notes (Signed)
Hi Diana Parsons, your metabolic panel looks great.  LDL cholesterol is at goal.  Your B12 looks great.

## 2022-02-08 DIAGNOSIS — L578 Other skin changes due to chronic exposure to nonionizing radiation: Secondary | ICD-10-CM | POA: Diagnosis not present

## 2022-02-08 DIAGNOSIS — L814 Other melanin hyperpigmentation: Secondary | ICD-10-CM | POA: Diagnosis not present

## 2022-02-08 DIAGNOSIS — L821 Other seborrheic keratosis: Secondary | ICD-10-CM | POA: Diagnosis not present

## 2022-02-08 DIAGNOSIS — D485 Neoplasm of uncertain behavior of skin: Secondary | ICD-10-CM | POA: Diagnosis not present

## 2022-03-05 ENCOUNTER — Ambulatory Visit (INDEPENDENT_AMBULATORY_CARE_PROVIDER_SITE_OTHER): Payer: Medicare Other | Admitting: Family Medicine

## 2022-03-05 DIAGNOSIS — Z780 Asymptomatic menopausal state: Secondary | ICD-10-CM | POA: Diagnosis not present

## 2022-03-05 DIAGNOSIS — Z0000 Encounter for general adult medical examination without abnormal findings: Secondary | ICD-10-CM | POA: Diagnosis not present

## 2022-03-05 NOTE — Patient Instructions (Addendum)
[facility] Maintenance Summary and Written Plan of Care  Ms. Dechert ,  Thank you for allowing me to perform your Medicare Annual Wellness Visit and for your ongoing commitment to your health.   Health Maintenance & Immunization History Health Maintenance  Topic Date Due   FOOT EXAM  03/06/2022 (Originally 02/27/2022)   COLONOSCOPY (Pts 45-77yr Insurance coverage will need to be confirmed)  06/05/2022 (Originally 05/30/2018)   OPHTHALMOLOGY EXAM  10/05/2022 (Originally 05/18/2021)   Zoster Vaccines- Shingrix (2 of 2) 01/04/2023 (Originally 04/17/2017)   HEMOGLOBIN A1C  04/05/2022   INFLUENZA VACCINE  05/15/2022   TETANUS/TDAP  11/13/2023   Pneumonia Vaccine 88 Years old  Completed   DEXA SCAN  Completed   COVID-19 Vaccine  Completed   HPV VACCINES  Aged Out   Immunization History  Administered Date(s) Administered   Fluad Quad(high Dose 65+) 07/01/2019   Influenza Split 08/14/2011   Influenza Whole 07/15/2006, 08/12/2007, 08/06/2008, 09/06/2009, 06/30/2010   Influenza, High Dose Seasonal PF 07/20/2016, 06/25/2017, 07/17/2018, 07/22/2020, 08/22/2021   Influenza, Seasonal, Injecte, Preservative Fre 09/19/2012   Influenza,inj,Quad PF,6+ Mos 08/11/2013, 06/18/2014, 08/24/2015   PFIZER Comirnaty(Gray Top)Covid-19 Tri-Sucrose Vaccine 05/02/2021   PFIZER(Purple Top)SARS-COV-2 Vaccination 12/17/2019, 01/13/2020, 07/22/2020   Pfizer Covid-19 Vaccine Bivalent Booster 86yr& up 08/22/2021   Pneumococcal Conjugate-13 09/24/2014   Pneumococcal Polysaccharide-23 07/15/2006   Td 10/16/2003   Tdap 11/12/2013   Zoster Recombinat (Shingrix) 02/20/2017   Zoster, Live 10/15/2010    These are the patient goals that we discussed:  Goals Addressed               This Visit's Progress     Patient Stated (pt-stated)        03/05/2022 AWV Goal: Diabetes Management  Patient will maintain an A1C level below 7.0 Patient will not develop any diabetic foot  complications Patient will not experience any hypoglycemic episodes over the next 3 months Patient will notify our office of any CBG readings outside of the provider recommended range by calling [phone] will adhere to provider recommendations for diabetes management  Patient Self Management Activities take all medications as prescribed and report any negative side effects monitor and record blood sugar readings as directed adhere to a low carbohydrate diet that incorporates lean proteins, vegetables, whole grains, low glycemic fruits check feet daily noting any sores, cracks, injuries, or callous formations see PCP or podiatrist if she notices any changes in her legs, feet, or toenails Patient will visit PCP and have an A1C level checked every 3 to 6 months as directed  have a yearly eye exam to monitor for vascular changes associated with diabetes and will request that the report be sent to her pcp.  consult with her PCP regarding any changes in her health or new or worsening symptoms          This is a list of Health Maintenance Items that are overdue or due now: Colorectal cancer screening   - discuss with PCP regarding CRC screening Shingrix vaccine Eye exam - have records faxed over Bone density scan  Orders/Referrals Placed Today: Orders Placed This Encounter  Procedures   DEXAScan    Standing Status:   Future    Standing Expiration Date:   03/06/2023    Scheduling Instructions:     Please call patient to schedule.    Order Specific Question:   Reason for exam:    Answer:   Post menopausal    Order Specific Question:  Preferred imaging location?    Answer:   [facility]    (Contact our referral department at [phone] if you have not spoken with someone about your referral appointment within the next 5 days)    Follow-up Plan Follow-up with Hali Marry, MD as planned Schedule your shingrix vaccine at your pharmacy. Medicare  wellness visit in one year.  Patient will access AVS on my chart.      Health Maintenance, Female Adopting a healthy lifestyle and getting preventive care are important in promoting health and wellness. Ask your health care provider about: The right schedule for you to have regular tests and exams. Things you can do on your own to prevent diseases and keep yourself healthy. What should I know about diet, weight, and exercise? Eat a healthy diet  Eat a diet that includes plenty of vegetables, fruits, low-fat dairy products, and lean protein. Do not eat a lot of foods that are high in solid fats, added sugars, or sodium. Maintain a healthy weight Body mass index (BMI) is used to identify weight problems. It estimates body fat based on height and weight. Your health care provider can help determine your BMI and help you achieve or maintain a healthy weight. Get regular exercise Get regular exercise. This is one of the most important things you can do for your health. Most adults should: Exercise for at least 150 minutes each week. The exercise should increase your heart rate and make you sweat (moderate-intensity exercise). Do strengthening exercises at least twice a week. This is in addition to the moderate-intensity exercise. Spend less time sitting. Even light physical activity can be beneficial. Watch cholesterol and blood lipids Have your blood tested for lipids and cholesterol at 86 years of age, then have this test every 5 years. Have your cholesterol levels checked more often if: Your lipid or cholesterol levels are high. You are older than 86 years of age. You are at high risk for heart disease. What should I know about cancer screening? Depending on your health history and family history, you may need to have cancer screening at various ages. This may include screening for: Breast cancer. Cervical cancer. Colorectal cancer. Skin cancer. Lung cancer. What should I know about  heart disease, diabetes, and high blood pressure? Blood pressure and heart disease High blood pressure causes heart disease and increases the risk of stroke. This is more likely to develop in people who have high blood pressure readings or are overweight. Have your blood pressure checked: Every 3-5 years if you are 48-38 years of age. Every year if you are 45 years old or older. Diabetes Have regular diabetes screenings. This checks your fasting blood sugar level. Have the screening done: Once every three years after age 42 if you are at a normal weight and have a low risk for diabetes. More often and at a younger age if you are overweight or have a high risk for diabetes. What should I know about preventing infection? Hepatitis B If you have a higher risk for hepatitis B, you should be screened for this virus. Talk with your health care provider to find out if you are at risk for hepatitis B infection. Hepatitis C Testing is recommended for: Everyone born from [birth year] through 1963. Anyone with known risk factors for hepatitis C. Sexually transmitted infections (STIs) Get screened for STIs, including gonorrhea and chlamydia, if: You are sexually active and are younger than 86 years of age. You are older than 86 years  of age and your health care provider tells you that you are at risk for this type of infection. Your sexual activity has changed since you were last screened, and you are at increased risk for chlamydia or gonorrhea. Ask your health care provider if you are at risk. Ask your health care provider about whether you are at high risk for HIV. Your health care provider may recommend a prescription medicine to help prevent HIV infection. If you choose to take medicine to prevent HIV, you should first get tested for HIV. You should then be tested every 3 months for as long as you are taking the medicine. Pregnancy If you are about to stop having your period (premenopausal) and you may  become pregnant, seek counseling before you get pregnant. Take 400 to 800 micrograms (mcg) of folic acid every day if you become pregnant. Ask for birth control (contraception) if you want to prevent pregnancy. Osteoporosis and menopause Osteoporosis is a disease in which the bones lose minerals and strength with aging. This can result in bone fractures. If you are 61 years old or older, or if you are at risk for osteoporosis and fractures, ask your health care provider if you should: Be screened for bone loss. Take a calcium or vitamin D supplement to lower your risk of fractures. Be given hormone replacement therapy (HRT) to treat symptoms of menopause. Follow these instructions at home: Alcohol use Do not drink alcohol if: Your health care provider tells you not to drink. You are pregnant, may be pregnant, or are planning to become pregnant. If you drink alcohol: Limit how much you have to: 0-1 drink a day. Know how much alcohol is in your drink. In the [place] one drink equals one 12 oz bottle of beer (355 mL), one 5 oz glass of wine (148 mL), or one 1 oz glass of hard liquor (44 mL). Lifestyle Do not use any products that contain nicotine or tobacco. These products include cigarettes, chewing tobacco, and vaping devices, such as e-cigarettes. If you need help quitting, ask your health care provider. Do not use street drugs. Do not share needles. Ask your health care provider for help if you need support or information about quitting drugs. General instructions Schedule regular health, dental, and eye exams. Stay current with your vaccines. Tell your health care provider if: You often feel depressed. You have ever been abused or do not feel safe at home. Summary Adopting a healthy lifestyle and getting preventive care are important in promoting health and wellness. Follow your health care provider's instructions about healthy diet, exercising, and getting tested or screened for  diseases. Follow your health care provider's instructions on monitoring your cholesterol and blood pressure. This information is not intended to replace advice given to you by your health care provider. Make sure you discuss any questions you have with your health care provider. Document Revised: 02/20/2021 Document Reviewed: 02/20/2021 Elsevier Patient Education  [place].

## 2022-03-05 NOTE — Progress Notes (Signed)
MEDICARE ANNUAL WELLNESS VISIT  03/05/2022  Telephone Visit Disclaimer This Medicare AWV was conducted by telephone due to national recommendations for restrictions regarding the COVID-19 Pandemic (e.g. social distancing).  I verified, using two identifiers, that I am speaking with [name] or their authorized healthcare agent. I discussed the limitations, risks, security, and privacy concerns of performing an evaluation and management service by telephone and the potential availability of an in-person appointment in the future. The patient expressed understanding and agreed to proceed.  Location of Patient: Home Location of Provider (nurse):  Provider home  Subjective:    JENISHA FAISON is a 88 y.o. female patient of Metheney, Rene Kocher, MD who had a Medicare Annual Wellness Visit today via telephone. Bree is Retired and lives alone. she has 1 child. she reports that she is socially active and does interact with friends/family regularly. she is minimally physically active and enjoys reading.  Patient Care Team: Hali Marry, MD as PCP - General Artist Pais MD (Orthopedic Surgery) Darius Bump, [facility] as Pharmacist (Pharmacist)     03/05/2022    3:25 PM 02/03/2021    3:55 PM 02/12/2014   10:43 AM  Advanced Directives  Does Patient Have a Medical Advance Directive? Yes Yes Patient has advance directive, copy not in chart  Type of Advance Directive Living will [place];Living will [place];Living will  Does patient want to make changes to medical advance directive? No - Patient declined No - Patient declined   Copy of [place] in Chart?  No - copy requested     Hospital Utilization Over the Past 12 Months: # of hospitalizations or ER visits: 0 # of surgeries: 0  Review of Systems    Patient reports that her overall health is better compared to last year.  History obtained from chart review and the  patient  Patient Reported Readings (BP, Pulse, CBG, Weight, etc) none  Pain Assessment Pain : 0-10 Pain Score: 4  Pain Type: Acute pain Pain Location: Foot Pain Orientation: Right, Left Pain Descriptors / Indicators: Burning Pain Onset: More than a month ago Pain Frequency: Intermittent Pain Relieving Factors: none  Pain Relieving Factors: none  Current Medications & Allergies (verified) Allergies as of 03/05/2022   No Known Allergies      Medication List        Accurate as of Mar 05, 2022  3:43 PM. If you have any questions, ask your nurse or doctor.          aspirin 81 MG tablet Take 81 mg by mouth daily.   bisacodyl 5 MG EC tablet Commonly known as: DULCOLAX Take 5 mg by mouth daily as needed for moderate constipation.   CALCIUM 600/VITAMIN D3 PO Take 1 tablet by mouth daily.   famotidine 20 MG tablet Commonly known as: PEPCID Take 1 tablet (20 mg total) by mouth daily.   fish oil-omega-3 fatty acids 1000 MG capsule Take 1 g by mouth daily.   lisinopril 5 MG tablet Commonly known as: ZESTRIL Take 1 tablet (5 mg total) by mouth daily.   Magnesium 200 MG Tabs Take 600 mg by mouth daily.   metFORMIN 500 MG 24 hr tablet Commonly known as: GLUCOPHAGE-XR TAKE 2 TABLETS BY MOUTH EVERY DAY WITH BREAKFAST   Methylcobalamin 5000 MCG Tbdp Take 1 tablet by mouth daily.   OneTouch Delica Plus [id number] Misc USE TO TEST TWICE A DAY   OneTouch Verio  test strip Generic drug: glucose blood Dx DM E11.8. Check fasting blood sugar every morning and after largest meal of the day a couple days a week.   OneTouch Verio w/Device Kit USE TO TEST ONCE DAILY. DX: E11.9   polyethylene glycol 17 g packet Commonly known as: MIRALAX / GLYCOLAX Take 17 g by mouth daily.   rosuvastatin 20 MG tablet Commonly known as: CRESTOR TAKE 1 TABLET BY MOUTH EVERY DAY   Trulicity 3 EH/2.0NO Sopn Generic drug: Dulaglutide Inject 3 mg as directed once a week. Start taking  on: Mar 12, 2022        History (reviewed): Past Medical History:  Diagnosis Date   Arthritis    Cancer ([place])    Cataract    Diabetes mellitus without complication ([place])    GERD (gastroesophageal reflux disease)    Past Surgical History:  Procedure Laterality Date   ABDOMINAL HYSTERECTOMY  88 yrs old   EYE SURGERY  Cataracts august 2021   left shoulder sx s/p fall  10/16/1999   has pin in place   lumbarp spine surgery  07/15/2014   Dr. Lurene Shadow   SPINE SURGERY     Family History  Problem Relation Age of Onset   Cancer Mother        colon   Diabetes Sister    Diabetes Brother    Cancer Brother        lung cancer   Social History   Socioeconomic History   Marital status: Legally Separated    Spouse name: Not on file   Number of children: 1   Years of education: 13   Highest education level: Some college, no degree  Occupational History    Comment: Retired  Tobacco Use   Smoking status: Former    Packs/day: 0.10    Years: 3.00    Pack years: 0.30    Types: Cigarettes   Smokeless tobacco: Former  Substance and Sexual Activity   Alcohol use: No   Drug use: No   Sexual activity: Never  Other Topics Concern   Not on file  Social History Narrative   Lives alone. She has a son; who lives close by. She likes to read and belongs to book club.   Social Determinants of Health   Financial Resource Strain: Low Risk    Difficulty of Paying Living Expenses: Not very hard  Food Insecurity: No Food Insecurity   Worried About Charity fundraiser in the Last Year: Never true   Ran Out of Food in the Last Year: Never true  Transportation Needs: No Transportation Needs   Lack of Transportation (Medical): No   Lack of Transportation (Non-Medical): No  Physical Activity: Insufficiently Active   Days of Exercise per Week: 2 days   Minutes of Exercise per Session: 20 min  Stress: No Stress Concern Present   Feeling of Stress : Not at all  Social Connections: Moderately  Integrated   Frequency of Communication with Friends and Family: More than three times a week   Frequency of Social Gatherings with Friends and Family: Twice a week   Attends Religious Services: More than 4 times per year   Active Member of [employer] or Organizations: Yes   Attends Archivist Meetings: More than 4 times per year   Marital Status: Divorced    Activities of Daily Living    03/02/2022   11:35 AM  In your present state of health, do you have any difficulty performing the following  activities:  Hearing? 0  Vision? 0  Difficulty concentrating or making decisions? 0  Walking or climbing stairs? 1  Comment due to neuropathy  Dressing or bathing? 0  Doing errands, shopping? 0  Preparing Food and eating ? N  Using the Toilet? N  In the past six months, have you accidently leaked urine? Y  Comment stress incontinence  Do you have problems with loss of bowel control? N  Managing your Medications? N  Managing your Finances? N  Housekeeping or managing your Housekeeping? N    Patient Education/ Literacy How often do you need to have someone help you when you read instructions, pamphlets, or other written materials from your doctor or pharmacy?: 1 - Never What is the last grade level you completed in school?: 12th grade and some college  Exercise Current Exercise Habits: The patient does not participate in regular exercise at present, Exercise limited by: neurologic condition(s);orthopedic condition(s) (diabetic neuropathy and knee pain.)  Diet Patient reports consuming 3 meals a day and 2 snack(s) a day Patient reports that her primary diet is: Regular Patient reports that she does have regular access to food.   Depression Screen    03/05/2022    3:29 PM 10/05/2021   10:38 AM 02/03/2021    3:56 PM 08/01/2020   11:25 AM 07/01/2019   11:40 AM 04/14/2018   11:17 AM 01/16/2018    1:30 PM  PHQ 2/9 Scores  PHQ - 2 Score 0 0 0 0 0 0 0     Fall Risk    03/02/2022    11:35 AM 10/05/2021   10:38 AM 02/03/2021    3:56 PM 02/03/2020   11:11 AM 07/01/2019   11:40 AM  [place] in the past year? 0 0 0 1 0  Number falls in past yr: 0 0 0 0 0  Injury with Fall? 0 0 0 1 0  Risk for fall due to : No Fall Risks No Fall Risks No Fall Risks Other (Comment)   Risk for fall due to: Comment    she slipped off of a podium and hit her knee   Follow up Falls evaluation completed Falls prevention discussed;Falls evaluation completed Falls evaluation completed Falls prevention discussed   Comment    advised to watch her footing      Objective:  Chauncey Cruel [place] seemed alert and oriented and she participated appropriately during our telephone visit.  Blood Pressure Weight BMI  BP Readings from Last 3 Encounters:  02/05/22 (!) 133/46  10/05/21 (!) 130/47  06/05/21 (!) 132/52   Wt Readings from Last 3 Encounters:  02/05/22 220 lb (99.8 kg)  10/05/21 222 lb (100.7 kg)  06/05/21 221 lb (100.2 kg)   BMI Readings from Last 1 Encounters:  02/05/22 35.51 kg/m    *Unable to obtain current vital signs, weight, and BMI due to telephone visit type  Hearing/Vision  Stanton Kidney did not seem to have difficulty with hearing/understanding during the telephone conversation Reports that she has had a formal eye exam by an eye care professional within the past year Reports that she has not had a formal hearing evaluation within the past year *Unable to fully assess hearing and vision during telephone visit type  Cognitive Function:    03/05/2022    3:33 PM 02/03/2021    4:03 PM  6CIT Screen  What Year? 0 points 0 points  What month? 0 points 0 points  What time? 0 points 0 points  Count back from 20 0 points 0 points  Months in reverse 0 points 0 points  Repeat phrase 2 points 0 points  Total Score 2 points 0 points   (Normal:0-7, Significant for Dysfunction: >8)  Normal Cognitive Function Screening: Yes   Immunization & Health Maintenance Record Immunization History   Administered Date(s) Administered   Fluad Quad(high Dose 65+) 07/01/2019   Influenza Split 08/14/2011   Influenza Whole 07/15/2006, 08/12/2007, 08/06/2008, 09/06/2009, 06/30/2010   Influenza, High Dose Seasonal PF 07/20/2016, 06/25/2017, 07/17/2018, 07/22/2020, 08/22/2021   Influenza, Seasonal, Injecte, Preservative Fre 09/19/2012   Influenza,inj,Quad PF,6+ Mos 08/11/2013, 06/18/2014, 08/24/2015   PFIZER Comirnaty(Gray Top)Covid-19 Tri-Sucrose Vaccine 05/02/2021   PFIZER(Purple Top)SARS-COV-2 Vaccination 12/17/2019, 01/13/2020, 07/22/2020   Pfizer Covid-19 Vaccine Bivalent Booster 17yr & up 08/22/2021   Pneumococcal Conjugate-13 09/24/2014   Pneumococcal Polysaccharide-23 07/15/2006   Td 10/16/2003   Tdap 11/12/2013   Zoster Recombinat (Shingrix) 02/20/2017   Zoster, Live 10/15/2010    Health Maintenance  Topic Date Due   FOOT EXAM  03/06/2022 (Originally 02/27/2022)   COLONOSCOPY (Pts 45-422yrInsurance coverage will need to be confirmed)  06/05/2022 (Originally 05/30/2018)   OPHTHALMOLOGY EXAM  10/05/2022 (Originally 05/18/2021)   Zoster Vaccines- Shingrix (2 of 2) 01/04/2023 (Originally 04/17/2017)   HEMOGLOBIN A1C  04/05/2022   INFLUENZA VACCINE  05/15/2022   TETANUS/TDAP  11/13/2023   Pneumonia Vaccine [age]Years old  Completed   DEXA SCAN  Completed   COVID-19 Vaccine  Completed   HPV VACCINES  Aged Out       Assessment  This is a routine wellness examination for [employer] Health Maintenance: Due or Overdue There are no preventive care reminders to display for this patient.   MaChauncey Crueloteet does not need a referral for Community Assistance: Care Management:   no Social Work:    no Prescription Assistance:  no Nutrition/Diabetes Education:  no   Plan:  Personalized Goals  Goals Addressed               This Visit's Progress     Patient Stated (pt-stated)        03/05/2022 AWV Goal: Diabetes Management  Patient will maintain an A1C level below  7.0 Patient will not develop any diabetic foot complications Patient will not experience any hypoglycemic episodes over the next 3 months Patient will notify our office of any CBG readings outside of the provider recommended range by calling [phone] will adhere to provider recommendations for diabetes management  Patient Self Management Activities take all medications as prescribed and report any negative side effects monitor and record blood sugar readings as directed adhere to a low carbohydrate diet that incorporates lean proteins, vegetables, whole grains, low glycemic fruits check feet daily noting any sores, cracks, injuries, or callous formations see PCP or podiatrist if she notices any changes in her legs, feet, or toenails Patient will visit PCP and have an A1C level checked every 3 to 6 months as directed  have a yearly eye exam to monitor for vascular changes associated with diabetes and will request that the report be sent to her pcp.  consult with her PCP regarding any changes in her health or new or worsening symptoms        Personalized Health Maintenance & Screening Recommendations  Colorectal cancer screening   - discuss with PCP regarding CRC screening Shingrix vaccine Eye exam - have records faxed over Bone density scan  Lung Cancer Screening Recommended: no (Low Dose CT Chest  recommended if Age 89-80 years, 30 pack-year currently smoking OR have quit w/in past 15 years) Hepatitis C Screening recommended: no HIV Screening recommended: no  Advanced Directives: Written information was not prepared per patient's request.  Referrals & Orders Orders Placed This Encounter  Procedures   [place]    Follow-up Plan Follow-up with Hali Marry, MD as planned Schedule your shingrix vaccine at your pharmacy. Medicare wellness visit in one year.  Patient will access AVS on my chart.   I have personally reviewed and noted the following in the  patient's chart:   Medical and social history Use of alcohol, tobacco or illicit drugs  Current medications and supplements Functional ability and status Nutritional status Physical activity Advanced directives List of other physicians Hospitalizations, surgeries, and ER visits in previous 12 months Vitals Screenings to include cognitive, depression, and falls Referrals and appointments  In addition, I have reviewed and discussed with [name] certain preventive protocols, quality metrics, and best practice recommendations. A written personalized care plan for preventive services as well as general preventive health recommendations is available and can be mailed to the patient at her request.      Tinnie Gens, RN  03/05/2022

## 2022-03-07 ENCOUNTER — Ambulatory Visit (INDEPENDENT_AMBULATORY_CARE_PROVIDER_SITE_OTHER): Payer: Medicare Other

## 2022-03-07 ENCOUNTER — Encounter: Payer: Self-pay | Admitting: Family Medicine

## 2022-03-07 DIAGNOSIS — M85852 Other specified disorders of bone density and structure, left thigh: Secondary | ICD-10-CM | POA: Diagnosis not present

## 2022-03-07 DIAGNOSIS — Z0000 Encounter for general adult medical examination without abnormal findings: Secondary | ICD-10-CM | POA: Diagnosis not present

## 2022-03-07 DIAGNOSIS — Z780 Asymptomatic menopausal state: Secondary | ICD-10-CM

## 2022-03-07 DIAGNOSIS — M8580 Other specified disorders of bone density and structure, unspecified site: Secondary | ICD-10-CM | POA: Insufficient documentation

## 2022-03-07 NOTE — Progress Notes (Signed)
HI Diana Parsons,   Your bone density test shows a T score -1.6.  This is in the mildly thin bone category called osteopenia.   The current recommendation for osteopenia (mildly thin bones) treatment includes:   #1 calcium-total of 1200 mg of calcium daily.  If you eat a very calcium rich diet you may be able to obtain that without a supplement.  If not, then I recommend calcium 500 mg twice a day.  There are several products over-the-counter such as Caltrate D and Viactiv chews which are great options that contain calcium and vitamin D. #2 vitamin D-recommend 800 international units daily. #3 exercise-recommend 30 minutes of weightbearing exercise 3 days a week.  Resistance training ,such as doing bands and light weights, can be particularly helpful.

## 2022-03-08 DIAGNOSIS — Z7984 Long term (current) use of oral hypoglycemic drugs: Secondary | ICD-10-CM | POA: Diagnosis not present

## 2022-03-08 DIAGNOSIS — H02836 Dermatochalasis of left eye, unspecified eyelid: Secondary | ICD-10-CM | POA: Diagnosis not present

## 2022-03-08 DIAGNOSIS — H35363 Drusen (degenerative) of macula, bilateral: Secondary | ICD-10-CM | POA: Diagnosis not present

## 2022-03-08 DIAGNOSIS — Z961 Presence of intraocular lens: Secondary | ICD-10-CM | POA: Diagnosis not present

## 2022-03-08 DIAGNOSIS — H3554 Dystrophies primarily involving the retinal pigment epithelium: Secondary | ICD-10-CM | POA: Diagnosis not present

## 2022-03-08 DIAGNOSIS — H02833 Dermatochalasis of right eye, unspecified eyelid: Secondary | ICD-10-CM | POA: Diagnosis not present

## 2022-03-08 DIAGNOSIS — E113299 Type 2 diabetes mellitus with mild nonproliferative diabetic retinopathy without macular edema, unspecified eye: Secondary | ICD-10-CM | POA: Diagnosis not present

## 2022-03-08 DIAGNOSIS — H353231 Exudative age-related macular degeneration, bilateral, with active choroidal neovascularization: Secondary | ICD-10-CM | POA: Diagnosis not present

## 2022-03-15 DIAGNOSIS — H353231 Exudative age-related macular degeneration, bilateral, with active choroidal neovascularization: Secondary | ICD-10-CM | POA: Diagnosis not present

## 2022-03-15 DIAGNOSIS — Z961 Presence of intraocular lens: Secondary | ICD-10-CM | POA: Diagnosis not present

## 2022-03-15 DIAGNOSIS — Z7984 Long term (current) use of oral hypoglycemic drugs: Secondary | ICD-10-CM | POA: Diagnosis not present

## 2022-03-15 DIAGNOSIS — E113293 Type 2 diabetes mellitus with mild nonproliferative diabetic retinopathy without macular edema, bilateral: Secondary | ICD-10-CM | POA: Diagnosis not present

## 2022-03-15 DIAGNOSIS — Z7985 Long-term (current) use of injectable non-insulin antidiabetic drugs: Secondary | ICD-10-CM | POA: Diagnosis not present

## 2022-03-15 DIAGNOSIS — H353221 Exudative age-related macular degeneration, left eye, with active choroidal neovascularization: Secondary | ICD-10-CM | POA: Diagnosis not present

## 2022-03-27 ENCOUNTER — Ambulatory Visit (INDEPENDENT_AMBULATORY_CARE_PROVIDER_SITE_OTHER): Payer: Medicare Other | Admitting: Pharmacist

## 2022-03-27 DIAGNOSIS — E118 Type 2 diabetes mellitus with unspecified complications: Secondary | ICD-10-CM

## 2022-03-27 DIAGNOSIS — E7800 Pure hypercholesterolemia, unspecified: Secondary | ICD-10-CM

## 2022-03-27 NOTE — Patient Instructions (Signed)
Visit Information  Thank you for taking time to visit with me today. Please don't hesitate to contact me if I can be of assistance to you before our next scheduled telephone appointment.  Following are the goals we discussed today:  Patient Goals/Self-Care Activities Over the next 90 days, patient will:  Take medications as prescribed  Follow Up Plan: Telephone appointment with care management team member scheduled for: 3-6 months  Please call the care guide team at [phone] if you need to cancel or reschedule your appointment.    Patient verbalizes understanding of instructions and care plan provided today and agrees to view in [place]. Active MyChart status and patient understanding of how to access instructions and care plan via MyChart confirmed with patient.     Diana Parsons

## 2022-03-27 NOTE — Progress Notes (Signed)
Chronic Care Management Pharmacy Note  03/27/2022 Name:  Diana Parsons MRN:  [id number] DOB:  [birth year]  Summary: addressed HLD, HTN, and primarily DM. Patient assistance for trulicity continues to run smoothly, she has increased her dose to 71m weekly, and decreased metformin to 1 tablet daily under guidance of PCP.  Patient requests refill on metformin, wants RX adjusted to once daily.  BG readings: ~130s, (112-141) Last A1c 6.5.   BP readings: 99/54, 113/56, 102/41, 113/55, asymptomatic with these pressures.  Recommendations/Changes made from today's visit: No changes at this time, patient doing well.  Plan: f/u with pharmacist in 3-6 months  Subjective: Diana ZULETAis an 88y.o. year old female who is a primary patient of Metheney, CRene Kocher MD.  The CCM team was consulted for assistance with disease management and care coordination needs.    Engaged with patient by telephone for follow up visit in response to provider referral for pharmacy case management and/or care coordination services.   Consent to Services:  The patient was given information about Chronic Care Management services, agreed to services, and gave verbal consent prior to initiation of services.  Please see initial visit note for detailed documentation.   Patient Care Team: MHali Marry MD as PCP - General HArtist PaisMD (Orthopedic Surgery) KDarius Bump [facility] Pharmacist (Pharmacist)  03/06/21 JIran PlanasPA-C- Patient was seen for Great toe pain of the right foot. Labs were ordered and patient started a short course of Prednisone 50 MG for 5 days. Follow up with PCP prn.   02/27/21 CBeatrice LecherMD(PCP) Patient was seen for Controlled Type 2 diabetes. Labs were ordered [facility] Patients Dulaglutide was increased to 1.5 MG subcu weekly. Famotidine was discontinued and pt advised to follow up in 14 weeks for DM follow up.   02/03/21 CBeatrice Lecher MD- Patient was seen for Colon cancer screening as a part of her Annual Wellness. [facility]Follow-up with MHali Marry MD as planned   12/26/20 CBeatrice LecherMD- Patient was seen for Microalbuminuria due to DM. Patient started Metformin 1000 MG daily and discontinued Dapagliflozin and Lisinopril 5 MG.   Recent consult visits:  None noted   Hospital visits:  None in previous 6 months    Objective:  Lab Results  Component Value Date   CREATININE 0.72 02/06/2022   CREATININE 0.67 10/05/2021   CREATININE 0.77 03/06/2021    Lab Results  Component Value Date   HGBA1C 6.5 (A) 10/05/2021   Last diabetic Eye exam:  Lab Results  Component Value Date/Time   HMDIABEYEEXA No Retinopathy 05/11/2020 12:00 AM    Last diabetic Foot exam:  Lab Results  Component Value Date/Time   HMDIABFOOTEX yes 02/07/2009 12:00 AM        Component Value Date/Time   CHOL 112 02/06/2022 0000   TRIG 134 02/06/2022 0000   HDL 44 (L) 02/06/2022 0000   CHOLHDL 2.5 02/06/2022 0000   VLDL 24 06/20/2016 0939   LDLCALC 46 02/06/2022 0000   LDLDIRECT 104 (H) 09/14/2011 0935       Latest Ref Rng & Units 02/06/2022   12:00 AM 11/18/2020    9:23 AM 10/05/2019   10:16 AM  Hepatic Function  Total Protein 6.1 - 8.1 g/dL 6.7  6.7  6.8   AST 10 - 35 U/L 14  15  28    ALT 6 - 29 U/L 9  9  24   Total Bilirubin 0.2 - 1.2 mg/dL 0.4  0.3  0.4     No results found for: "TSH", "FREET4"      No data to display          Lab Results  Component Value Date/Time   VD25OH 32 07/22/2018 09:03 AM   VD25OH 22 (L) 11/24/2015 11:00 AM   Social History   Tobacco Use  Smoking Status Former   Packs/day: 0.10   Years: 3.00   Total pack years: 0.30   Types: Cigarettes  Smokeless Tobacco Former   BP Readings from Last 3 Encounters:  02/05/22 (!) 133/46  10/05/21 (!) 130/47  06/05/21 (!) 132/52   Pulse Readings from Last 3 Encounters:  02/05/22 88  10/05/21  83  06/05/21 82   Wt Readings from Last 3 Encounters:  02/05/22 220 lb (99.8 kg)  10/05/21 222 lb (100.7 kg)  06/05/21 221 lb (100.2 kg)    Assessment: Review of patient past medical history, allergies, medications, health status, including review of consultants reports, laboratory and other test data, was performed as part of comprehensive evaluation and provision of chronic care management services.   SDOH:  (Social Determinants of Health) assessments and interventions performed:    CCM Care Plan  No Known Allergies  Medications Reviewed Today     Reviewed by Darius Bump, [facility] (Pharmacist) on 03/27/22 at 1428  Med List Status: <None>   Medication Order Taking? Sig Documenting Provider Last Dose Status Informant  aspirin 81 MG tablet [id number] Yes Take 81 mg by mouth daily. [provider] Taking Active   bisacodyl (DULCOLAX) 5 MG EC tablet [id number] Yes Take 5 mg by mouth daily as needed for moderate constipation. [provider] Taking Active   Blood Glucose Monitoring Suppl [facility] VERIO) w/Device KIT [id number] Yes USE TO TEST ONCE DAILY. DX: E11.9 Hali Marry, MD Taking Active   CALCIUM-MAGNESIUM-VITAMIN D PO [id number] Yes Take 3 tablets by mouth daily. 346m Calcium 131mMagnesium 35m3minc 200 IU Vitamin D3 [provider] Taking Active   Dulaglutide (TRULICITY) 3 MG/GY/1.8HUPN [id number] Inject 3 mg as directed once a week. MetHali MarryD Taking Active   famotidine (PEPCID) 20 MG tablet [id number] Take 1 tablet (20 mg total) by mouth daily. MetHali MarryD Taking Active   fish oil-omega-3 fatty acids 1000 MG capsule [id number] Take 1 g by mouth daily. [provider] Taking Active   glucose blood (ONETOUCH VERIO) test strip [id number] Dx DM E11.8. Check fasting blood sugar every morning and after largest meal of the day a couple days a week. MetHali MarryD Taking Active   Lancets  (ONETOUCH DELICA PLUS [id number] USE TO TEST TWICE A DAY MetHali MarryD Taking Active   lisinopril (ZESTRIL) 5 MG tablet [id number] Take 1 tablet (5 mg total) by mouth daily. MetHali MarryD Taking Active   metFORMIN (GLUCOPHAGE-XR) 500 MG 24 hr tablet [id number] TAKE 2 TABLETS BY MOUTH EVERY DAY WITH BREAKFAST  Patient taking differently: Take 500 mg by mouth daily.   MetHali MarryD Taking Active   Methylcobalamin 5000 MCG TBDP [id number] Take 1 tablet by mouth daily. [provider] Taking Active   Multiple Vitamins-Minerals (PRESERVISION AREDS PO) [id number] Take 1 tablet by mouth in the morning and at bedtime. [provider] Taking Active   polyethylene glycol (MIRALAX / GLYCOLAX) 17 g packet [id number]  Yes Take 17 g by mouth daily as needed. [provider] Taking Active   rosuvastatin (CRESTOR) 20 MG tablet [id number] Yes TAKE 1 TABLET BY MOUTH EVERY DAY Hali Marry, MD Taking Active             Patient Active Problem List   Diagnosis Date Noted   Osteopenia 03/07/2022   Chronic constipation 10/05/2021   Edema of right foot 03/06/2021   Great toe pain, right 03/06/2021   Combined forms of age-related cataract of left eye 06/02/2020   Aortic valve sclerosis 11/23/2019   Family history of lung cancer 11/09/2019   Orthopnea 11/09/2019   PND (paroxysmal nocturnal dyspnea) 11/09/2019   Esophagitis 11/09/2019   BMI 34.0-34.9,adult 07/03/2019   Microalbuminuria due to type 2 diabetes mellitus ([place]) 07/20/2016   Lumbar degenerative disc disease 05/03/2014   Status post lumbar surgery 09/03/2013   Spinal stenosis of lumbar region without neurogenic claudication 08/30/2013   Tear of meniscus of knee 05/29/2013   Osteoarthritis of left knee 05/28/2012   Controlled diabetes mellitus type 2 with complications ([place]) 40/98/1191   HYPERCHOLESTEROLEMIA, PURE 02/03/2007   BREAST MASS, LEFT  02/03/2007   POSTMENOPAUSAL STATUS 02/03/2007    Immunization History  Administered Date(s) Administered   Fluad Quad(high Dose 65+) 07/01/2019   Influenza Split 08/14/2011   Influenza Whole 07/15/2006, 08/12/2007, 08/06/2008, 09/06/2009, 06/30/2010   Influenza, High Dose Seasonal PF 07/20/2016, 06/25/2017, 07/17/2018, 07/22/2020, 08/22/2021   Influenza, Seasonal, Injecte, Preservative Fre 09/19/2012   Influenza,inj,Quad PF,6+ Mos 08/11/2013, 06/18/2014, 08/24/2015   PFIZER Comirnaty(Gray Top)Covid-19 Tri-Sucrose Vaccine 05/02/2021   PFIZER(Purple Top)SARS-COV-2 Vaccination 12/17/2019, 01/13/2020, 07/22/2020   Pfizer Covid-19 Vaccine Bivalent Booster 24yr & up 08/22/2021   Pneumococcal Conjugate-13 09/24/2014   Pneumococcal Polysaccharide-23 07/15/2006   Td 10/16/2003   Tdap 11/12/2013   Zoster Recombinat (Shingrix) 02/20/2017   Zoster, Live 10/15/2010    Conditions to be addressed/monitored: HLD and DMII  Care Plan : Medication Management  Updates made by KDarius Bump RBranchdalesince 03/27/2022 12:00 AM     Problem: HLD, DM      Long-Range Goal: Disease Progression Prevention   Start Date: 06/06/2021  Recent Progress: On track  Priority: High  Note:   Current Barriers:  None at present  Pharmacist Clinical Goal(s):  Over the next 90 days, patient will verbalize ability to afford treatment regimen through collaboration with PharmD and provider.   Interventions: 1:1 collaboration with MHali Marry MD regarding development and update of comprehensive plan of care as evidenced by provider attestation and co-signature Inter-disciplinary care team collaboration (see longitudinal plan of care) Comprehensive medication review performed; medication list updated in electronic medical record  Diabetes:  Controlled; current treatment:trulicity 333mweekly, metformin XR 50043maily;  Current glucose readings: fasting glucose: 120-130s  Denies hypoglycemic/hyperglycemic  symptoms  Current meal patterns: breakfast: cereal with milk & blueberries, bran or shredded wheat with coffee, sometimes boiled egg + applesauce, piece of toast, or oatmeal with fruit; lunch: sandwich, or leftovers; dinner: chicken or fish & vegetables; snacks: potato chips, cheese puffs; drinks: unsweet iced tea (adds truvia substitute), diet peach tea, diet ginger ale, water  Current exercise: struggles, not very active. Likes to read a lot. Uses a cane to walk  Recommended continue current regimen Assessed patient finances. Patient submitted forms for trulicity PAP [id number] and receiving shipments.  Hypertension  Controlled; current treatment: lisinopril 5mg36mily  Readings at home: 120/60, 113/62  Recommended continue current regimen Hyperlipidemia:  Controlled; current treatment:rosuvastatin 20mg60mly;  LDL 51  Recommended continue current regimen  Patient Goals/Self-Care Activities Over the next 90 days, patient will:  Take medications as prescribed  Follow Up Plan: Telephone appointment with care management team member scheduled for: 3-6 months         Medication Assistance: Application for Trulicity  medication assistance program. in process.  Anticipated assistance start date TBD.  See plan of care for additional detail.  Patient's preferred pharmacy is:  [place], [place] - [address] [place] [address] Phone: [phone] Fax: [phone]  [facility] [address] [place], [place] [address] Phone: [phone] Fax: [phone]   Uses pill box? Yes Pt endorses 100% compliance  Follow Up:  Patient agrees to Care Plan and Follow-up.  Plan: Telephone follow up appointment with care management team member scheduled for:  3-6 month  Larinda Buttery, PharmD Clinical Pharmacist [facility] [phone]

## 2022-03-28 MED ORDER — METFORMIN HCL ER 500 MG PO TB24: 500.0000 mg | ORAL_TABLET | Freq: Every day | ORAL | 1 refills | Status: DC

## 2022-03-28 NOTE — Progress Notes (Addendum)
Doing great on just 1 tab daily of metformin.  New prescription sent to pharmacy with updated Sigg to reflect how she is actually taking the medication doing well with increased dose of Trulicity.  Meds ordered this encounter  Medications   metFORMIN (GLUCOPHAGE-XR) 500 MG 24 hr tablet    Sig: Take 1 tablet (500 mg total) by mouth daily with breakfast.    Dispense:  90 tablet    Refill:  1   Encounter details: CCM Time Spent       Value Time User   Time spent with patient (minutes)  30 03/27/2022  2:30 PM Darius Bump, RPH   Time spent performing Chart review  30 03/27/2022  2:30 PM Darius Bump, [facility]   Total time (minutes)  60 03/27/2022  2:30 PM Darius Bump, [place]       I have personally reviewed this encounter including the documentation in this note and have collaborated with the care management provider regarding care management and care coordination activities to include development and update of the comprehensive care plan. I am certifying that I agree with the content of this note and encounter as supervising physician/advanced practice provider.

## 2022-03-28 NOTE — Addendum Note (Signed)
Addended by: [name] D on: 03/28/2022 07:48 AM   Modules accepted: Orders

## 2022-04-11 DIAGNOSIS — M25562 Pain in left knee: Secondary | ICD-10-CM | POA: Diagnosis not present

## 2022-04-12 DIAGNOSIS — H353231 Exudative age-related macular degeneration, bilateral, with active choroidal neovascularization: Secondary | ICD-10-CM | POA: Diagnosis not present

## 2022-04-12 DIAGNOSIS — H02833 Dermatochalasis of right eye, unspecified eyelid: Secondary | ICD-10-CM | POA: Diagnosis not present

## 2022-04-12 DIAGNOSIS — H02836 Dermatochalasis of left eye, unspecified eyelid: Secondary | ICD-10-CM | POA: Diagnosis not present

## 2022-04-12 DIAGNOSIS — Z961 Presence of intraocular lens: Secondary | ICD-10-CM | POA: Diagnosis not present

## 2022-04-13 DIAGNOSIS — I10 Essential (primary) hypertension: Secondary | ICD-10-CM

## 2022-04-13 DIAGNOSIS — E785 Hyperlipidemia, unspecified: Secondary | ICD-10-CM

## 2022-04-13 DIAGNOSIS — E1159 Type 2 diabetes mellitus with other circulatory complications: Secondary | ICD-10-CM

## 2022-05-17 DIAGNOSIS — H353211 Exudative age-related macular degeneration, right eye, with active choroidal neovascularization: Secondary | ICD-10-CM | POA: Diagnosis not present

## 2022-05-17 DIAGNOSIS — Z961 Presence of intraocular lens: Secondary | ICD-10-CM | POA: Diagnosis not present

## 2022-05-17 DIAGNOSIS — H353231 Exudative age-related macular degeneration, bilateral, with active choroidal neovascularization: Secondary | ICD-10-CM | POA: Diagnosis not present

## 2022-06-05 ENCOUNTER — Ambulatory Visit (INDEPENDENT_AMBULATORY_CARE_PROVIDER_SITE_OTHER): Payer: Medicare Other

## 2022-06-05 ENCOUNTER — Ambulatory Visit (INDEPENDENT_AMBULATORY_CARE_PROVIDER_SITE_OTHER): Payer: Medicare Other | Admitting: Sports Medicine

## 2022-06-05 DIAGNOSIS — M1812 Unilateral primary osteoarthritis of first carpometacarpal joint, left hand: Secondary | ICD-10-CM | POA: Diagnosis not present

## 2022-06-05 DIAGNOSIS — M2550 Pain in unspecified joint: Secondary | ICD-10-CM

## 2022-06-05 DIAGNOSIS — M180 Bilateral primary osteoarthritis of first carpometacarpal joints: Secondary | ICD-10-CM

## 2022-06-05 DIAGNOSIS — M1811 Unilateral primary osteoarthritis of first carpometacarpal joint, right hand: Secondary | ICD-10-CM | POA: Diagnosis not present

## 2022-06-05 MED ORDER — CELECOXIB 200 MG PO CAPS: ORAL_CAPSULE | ORAL | 2 refills | Status: DC

## 2022-06-05 NOTE — Assessment & Plan Note (Addendum)
Diana Parsons is a very pleasant 88 year old female, she has bilateral hand pain, localized at the thumb basal joint, with gelling and worse in the mornings, no numbness or paresthesias into the fingers. She has tenderness at the thumb basal joint and a negative Tinel's and Phalen sign suggesting against carpal tunnel syndrome. She has a negative Finkelstein sign suggesting against de Quervain's tendinitis. Proceeding with conservative treatment including x-rays, home conditioning, Celebrex. Return to see me in about 6 weeks for injections if not better.  Update: Peripheral erosive changes noted on x-rays, adding rheumatoid work-up.

## 2022-06-05 NOTE — Progress Notes (Addendum)
    Procedures performed today:    None.  Independent interpretation of notes and tests performed by another provider:   None.  Brief History, Exam, Impression, and Recommendations:    Primary osteoarthritis of both first carpometacarpal joints Diana Parsons is a very pleasant 88 year old female, she has bilateral hand pain, localized at the thumb basal joint, with gelling and worse in the mornings, no numbness or paresthesias into the fingers. She has tenderness at the thumb basal joint and a negative Tinel's and Phalen sign suggesting against carpal tunnel syndrome. She has a negative Finkelstein sign suggesting against de Quervain's tendinitis. Proceeding with conservative treatment including x-rays, home conditioning, Celebrex. Return to see me in about 6 weeks for injections if not better.  Update: Peripheral erosive changes noted on x-rays, adding rheumatoid work-up.  Chronic process with exacerbation and pharmacologic intervention  ____________________________________________ Gwen Her. Dianah Field, M.D., ABFM., CAQSM., AME. Primary Care and Sports Medicine [facility]  Adjunct Professor of [place] of [facility] of Medicine  Risk manager

## 2022-06-07 ENCOUNTER — Encounter: Payer: Self-pay | Admitting: Family Medicine

## 2022-06-07 ENCOUNTER — Ambulatory Visit (INDEPENDENT_AMBULATORY_CARE_PROVIDER_SITE_OTHER): Payer: Medicare Other | Admitting: Family Medicine

## 2022-06-07 VITALS — BP 128/43 | HR 80 | Wt 223.0 lb

## 2022-06-07 DIAGNOSIS — E118 Type 2 diabetes mellitus with unspecified complications: Secondary | ICD-10-CM

## 2022-06-07 DIAGNOSIS — E7800 Pure hypercholesterolemia, unspecified: Secondary | ICD-10-CM | POA: Diagnosis not present

## 2022-06-07 DIAGNOSIS — H3530 Unspecified macular degeneration: Secondary | ICD-10-CM

## 2022-06-07 DIAGNOSIS — E1129 Type 2 diabetes mellitus with other diabetic kidney complication: Secondary | ICD-10-CM | POA: Diagnosis not present

## 2022-06-07 DIAGNOSIS — R809 Proteinuria, unspecified: Secondary | ICD-10-CM

## 2022-06-07 LAB — POCT GLYCOSYLATED HEMOGLOBIN (HGB A1C): HbA1c, POC (controlled diabetic range): 6.5 % (ref 0.0–7.0)

## 2022-06-07 LAB — POCT UA - MICROALBUMIN
Albumin/Creatinine Ratio, Urine, POC: ABNORMAL
Creatinine, POC: 300 mg/dL
Microalbumin Ur, POC: 150 mg/L

## 2022-06-07 MED ORDER — TRULICITY 4.5 MG/0.5ML ~~LOC~~ SOAJ: 4.5000 mg | SUBCUTANEOUS | 3 refills | Status: DC

## 2022-06-07 NOTE — Progress Notes (Signed)
Established Patient Office Visit  Subjective   Patient ID: Diana Parsons, female    DOB: Feb 04, 1934  Age: 88 y.o. MRN: [id number]  Chief Complaint  Patient presents with   Diabetes    HPI  Diabetes - no hypoglycemic events. No wounds or sores that are not healing well. No increased thirst or urination. Checking glucose at home. Taking medications as prescribed without any side effects.  He is currently getting her Trulicity through [facility].  Would like to get off of the 1 tab of metformin that she still taking.  She has been seeing her eye doctor regularly for her macular degeneration and her right is a little worse than her left she has been getting injections monthly.  Simply started on Celebrex by Dr. Dianah Field, sports medicine for her thumb arthritis she has been doing the exercises as well she says its actually been pretty helpful and has provided some significant pain relief.     ROS    Objective:     BP (!) 128/43   Pulse 80   Wt 223 lb (101.2 kg)   SpO2 96%   BMI 35.99 kg/m    Physical Exam Vitals and nursing note reviewed.  Constitutional:      Appearance: She is well-developed.  HENT:     Head: Normocephalic and atraumatic.  Cardiovascular:     Rate and Rhythm: Normal rate and regular rhythm.     Heart sounds: Normal heart sounds.  Pulmonary:     Effort: Pulmonary effort is normal.     Breath sounds: Normal breath sounds.  Skin:    General: Skin is warm and dry.  Neurological:     Mental Status: She is alert and oriented to person, place, and time.  Psychiatric:        Behavior: Behavior normal.     Results for orders placed or performed in visit on 06/07/22  POCT UA - Microalbumin  Result Value Ref Range   Microalbumin Ur, POC 150 mg/L   Creatinine, POC 300 mg/dL   Albumin/Creatinine Ratio, Urine, POC abnormal   POCT HgB A1C  Result Value Ref Range   Hemoglobin A1C     HbA1c POC (<> result, manual entry)     HbA1c, POC  (prediabetic range)     HbA1c, POC (controlled diabetic range) 6.5 0.0 - 7.0 %      The ASCVD Risk score (Arnett DK, et al., 2019) failed to calculate for the following reasons:   The 2019 ASCVD risk score is only valid for ages 32 to 31    Assessment & Plan:   Problem List Items Addressed This Visit       Endocrine   Microalbuminuria due to type 2 diabetes mellitus ([place])    Spilling some protein in the urine.  A1c at goal.  Continue with lisinopril.      Relevant Medications   Dulaglutide (TRULICITY) 4.5 LP/3.7TK SOPN   Controlled diabetes mellitus type 2 with complications (HCC) - Primary    Crease Trulicity to 4.5 with the hope of discontinuing the metformin.  We will need to submit new prescription to [facility].      Relevant Medications   Dulaglutide (TRULICITY) 4.5 WI/0.9BD SOPN   Other Relevant Orders   POCT UA - Microalbumin (Completed)   POCT HgB A1C (Completed)     Other   Macular degeneration    Following only with ophthalmology.      HYPERCHOLESTEROLEMIA, PURE    Continue statin  Return in about 4 months (around 09/24/2022) for Diabetes follow-up.    [name], MD

## 2022-06-07 NOTE — Assessment & Plan Note (Signed)
Spilling some protein in the urine.  A1c at goal.  Continue with lisinopril.

## 2022-06-07 NOTE — Assessment & Plan Note (Signed)
Crease Trulicity to 4.5 with the hope of discontinuing the metformin.  We will need to submit new prescription to [facility].

## 2022-06-07 NOTE — Assessment & Plan Note (Signed)
Following only with ophthalmology.

## 2022-06-07 NOTE — Assessment & Plan Note (Signed)
Continue statin. 

## 2022-06-08 DIAGNOSIS — M2550 Pain in unspecified joint: Secondary | ICD-10-CM | POA: Diagnosis not present

## 2022-06-08 NOTE — Addendum Note (Signed)
Addended by: Silverio Decamp on: 06/08/2022 09:38 AM   Modules accepted: Orders

## 2022-06-12 LAB — CBC WITH DIFFERENTIAL/PLATELET
Absolute Monocytes: 562 cells/uL (ref 200–950)
Basophils Absolute: 67 cells/uL (ref 0–200)
Basophils Relative: 0.9 %
Eosinophils Absolute: 163 cells/uL (ref 15–500)
Eosinophils Relative: 2.2 %
HCT: 38.1 % (ref 35.0–45.0)
Hemoglobin: 12.5 g/dL (ref 11.7–15.5)
Lymphs Abs: 1658 cells/uL (ref 850–3900)
MCH: 28.4 pg (ref 27.0–33.0)
MCHC: 32.8 g/dL (ref 32.0–36.0)
MCV: 86.6 fL (ref 80.0–100.0)
MPV: 12.4 fL (ref 7.5–12.5)
Monocytes Relative: 7.6 %
Neutro Abs: 4951 cells/uL (ref 1500–7800)
Neutrophils Relative %: 66.9 %
Platelets: 168 10*3/uL (ref 140–400)
RBC: 4.4 10*6/uL (ref 3.80–5.10)
RDW: 14.3 % (ref 11.0–15.0)
Total Lymphocyte: 22.4 %
WBC: 7.4 10*3/uL (ref 3.8–10.8)

## 2022-06-12 LAB — LUPUS(12) PANEL
Anti Nuclear Antibody (ANA): NEGATIVE
C3 Complement: 164 mg/dL
C4 Complement: 33 mg/dL
ENA SM Ab Ser-aCnc: <1 AI
Rheumatoid fact SerPl-aCnc: <14 IU/mL (ref <14)
Ribosomal P Protein Ab: <1 AI
SM/RNP: <1 AI
SSA (Ro) (ENA) Antibody, IgG: <1 AI
SSB (La) (ENA) Antibody, IgG: <1 AI
Scleroderma (Scl-70) (ENA) Antibody, IgG: <1 AI
Thyroperoxidase Ab SerPl-aCnc: 1 IU/mL (ref <9)
ds DNA Ab: 1 IU/mL

## 2022-06-12 LAB — RHEUMATOID FACTOR (IGA, IGG, IGM)
Rheumatoid Factor (IgA): <5 U (ref <=6)
Rheumatoid Factor (IgG): 5 U (ref <=6)
Rheumatoid Factor (IgM): <5 U (ref <=6)

## 2022-06-12 LAB — CYCLIC CITRUL PEPTIDE ANTIBODY, IGG: Cyclic Citrullin Peptide Ab: <16 UNITS

## 2022-06-12 LAB — COMPREHENSIVE METABOLIC PANEL
AG Ratio: 1.7 (calc) (ref 1.0–2.5)
ALT: 11 U/L (ref 6–29)
AST: 15 U/L (ref 10–35)
Albumin: 4.1 g/dL (ref 3.6–5.1)
Alkaline phosphatase (APISO): 71 U/L (ref 37–153)
BUN: 14 mg/dL (ref 7–25)
CO2: 28 mmol/L (ref 20–32)
Calcium: 9.1 mg/dL (ref 8.6–10.4)
Chloride: 104 mmol/L (ref 98–110)
Creat: 0.82 mg/dL (ref 0.60–0.95)
Globulin: 2.4 g/dL (calc) (ref 1.9–3.7)
Glucose, Bld: 130 mg/dL — ABNORMAL HIGH (ref 65–99)
Potassium: 4.6 mmol/L (ref 3.5–5.3)
Sodium: 140 mmol/L (ref 135–146)
Total Bilirubin: 0.3 mg/dL (ref 0.2–1.2)
Total Protein: 6.5 g/dL (ref 6.1–8.1)

## 2022-06-12 LAB — SEDIMENTATION RATE: Sed Rate: 29 mm/h (ref 0–30)

## 2022-06-12 LAB — URIC ACID: Uric Acid, Serum: 3.1 mg/dL (ref 2.5–7.0)

## 2022-06-12 LAB — CK: Total CK: 57 U/L (ref 29–143)

## 2022-06-21 DIAGNOSIS — Z961 Presence of intraocular lens: Secondary | ICD-10-CM | POA: Diagnosis not present

## 2022-06-21 DIAGNOSIS — Z79620 Long term (current) use of immunosuppressive biologic: Secondary | ICD-10-CM | POA: Diagnosis not present

## 2022-06-21 DIAGNOSIS — H353231 Exudative age-related macular degeneration, bilateral, with active choroidal neovascularization: Secondary | ICD-10-CM | POA: Diagnosis not present

## 2022-07-09 DIAGNOSIS — H26491 Other secondary cataract, right eye: Secondary | ICD-10-CM | POA: Diagnosis not present

## 2022-07-09 DIAGNOSIS — E119 Type 2 diabetes mellitus without complications: Secondary | ICD-10-CM | POA: Diagnosis not present

## 2022-07-09 DIAGNOSIS — H02834 Dermatochalasis of left upper eyelid: Secondary | ICD-10-CM | POA: Diagnosis not present

## 2022-07-09 DIAGNOSIS — H02831 Dermatochalasis of right upper eyelid: Secondary | ICD-10-CM | POA: Diagnosis not present

## 2022-07-16 ENCOUNTER — Ambulatory Visit (INDEPENDENT_AMBULATORY_CARE_PROVIDER_SITE_OTHER): Payer: Medicare Other | Admitting: Sports Medicine

## 2022-07-16 ENCOUNTER — Encounter: Payer: Self-pay | Admitting: Sports Medicine

## 2022-07-16 DIAGNOSIS — M180 Bilateral primary osteoarthritis of first carpometacarpal joints: Secondary | ICD-10-CM | POA: Diagnosis not present

## 2022-07-16 NOTE — Assessment & Plan Note (Signed)
Diana Parsons returns, she is a very pleasant 88 year old female, she has had bilateral hand pain localized at the thumb basal joint, we obtained x-rays that showed some potential erosive changes concerning for rheumatoid arthritis, I obtained a full rheumatoid work-up that was negative. We added Celebrex, home conditioning, and she returns today symptom-free, continue Celebrex, she will get annual renal function screening with her PCP, and if insufficient improvement or worsening of pain we will proceed with bilateral CMC injections.

## 2022-07-16 NOTE — Progress Notes (Signed)
    Procedures performed today:    None.  Independent interpretation of notes and tests performed by another provider:   None.  Brief History, Exam, Impression, and Recommendations:    Primary osteoarthritis of both first carpometacarpal joints Diana Parsons returns, she is a very pleasant 88 year old female, she has had bilateral hand pain localized at the thumb basal joint, we obtained x-rays that showed some potential erosive changes concerning for rheumatoid arthritis, I obtained a full rheumatoid work-up that was negative. We added Celebrex, home conditioning, and she returns today symptom-free, continue Celebrex, she will get annual renal function screening with her PCP, and if insufficient improvement or worsening of pain we will proceed with bilateral CMC injections.   ____________________________________________ Gwen Her. Dianah Field, M.D., ABFM., CAQSM., AME. Primary Care and Sports Medicine [facility]  Adjunct Professor of [place] of [facility] [employer] [facility] of Medicine  Risk manager

## 2022-07-17 ENCOUNTER — Ambulatory Visit: Payer: Medicare Other | Admitting: Sports Medicine

## 2022-07-18 DIAGNOSIS — H3589 Other specified retinal disorders: Secondary | ICD-10-CM | POA: Diagnosis not present

## 2022-07-18 DIAGNOSIS — H35362 Drusen (degenerative) of macula, left eye: Secondary | ICD-10-CM | POA: Diagnosis not present

## 2022-07-18 DIAGNOSIS — H353231 Exudative age-related macular degeneration, bilateral, with active choroidal neovascularization: Secondary | ICD-10-CM | POA: Diagnosis not present

## 2022-07-18 DIAGNOSIS — E113213 Type 2 diabetes mellitus with mild nonproliferative diabetic retinopathy with macular edema, bilateral: Secondary | ICD-10-CM | POA: Diagnosis not present

## 2022-08-22 DIAGNOSIS — H35363 Drusen (degenerative) of macula, bilateral: Secondary | ICD-10-CM | POA: Diagnosis not present

## 2022-08-22 DIAGNOSIS — H353231 Exudative age-related macular degeneration, bilateral, with active choroidal neovascularization: Secondary | ICD-10-CM | POA: Diagnosis not present

## 2022-08-22 DIAGNOSIS — H3554 Dystrophies primarily involving the retinal pigment epithelium: Secondary | ICD-10-CM | POA: Diagnosis not present

## 2022-08-22 DIAGNOSIS — Z961 Presence of intraocular lens: Secondary | ICD-10-CM | POA: Diagnosis not present

## 2022-08-23 ENCOUNTER — Ambulatory Visit (INDEPENDENT_AMBULATORY_CARE_PROVIDER_SITE_OTHER): Payer: Medicare Other | Admitting: Sports Medicine

## 2022-08-23 ENCOUNTER — Ambulatory Visit (INDEPENDENT_AMBULATORY_CARE_PROVIDER_SITE_OTHER): Payer: Medicare Other

## 2022-08-23 DIAGNOSIS — M1712 Unilateral primary osteoarthritis, left knee: Secondary | ICD-10-CM

## 2022-08-23 MED ORDER — TRIAMCINOLONE ACETONIDE 40 MG/ML IJ SUSP
40.0000 mg | Freq: Once | INTRAMUSCULAR | Status: AC
  Administered 2022-08-23: 40 mg via INTRAMUSCULAR

## 2022-08-23 NOTE — Assessment & Plan Note (Signed)
This is a very pleasant 88 year old female, she has known left knee osteoarthritis, we last treated this in 2014, she had some recurrence of pain, she did have an injection with [facility] back in June. Desiring repeat injection today. We injected her left knee, return as needed, we can certainly try viscosupplementation again, last done in 2014.

## 2022-08-23 NOTE — Progress Notes (Signed)
    Procedures performed today:    Procedure: Real-time Ultrasound Guided injection of the left knee Device: Samsung HS60  Verbal informed consent obtained.  Time-out conducted.  Noted no overlying erythema, induration, or other signs of local infection.  Skin prepped in a sterile fashion.  Local anesthesia: Topical Ethyl chloride.  With sterile technique and under real time ultrasound guidance: Mild effusion noted, 1 cc Kenalog 40, 2 cc lidocaine, 2 cc bupivacaine injected easily Completed without difficulty  Advised to call if fevers/chills, erythema, induration, drainage, or persistent bleeding.  Images permanently stored and available for review in PACS.  Impression: Technically successful ultrasound guided injection.  Independent interpretation of notes and tests performed by another provider:   None.  Brief History, Exam, Impression, and Recommendations:    Primary osteoarthritis of left knee This is a very pleasant 88 year old female, she has known left knee osteoarthritis, we last treated this in 2014, she had some recurrence of pain, she did have an injection with [facility] back in June. Desiring repeat injection today. We injected her left knee, return as needed, we can certainly try viscosupplementation again, last done in 2014.    ____________________________________________ Gwen Her. Dianah Field, M.D., ABFM., CAQSM., AME. Primary Care and Sports Medicine [facility]  Adjunct Professor of [place] of [place] of Medicine  Risk manager

## 2022-08-24 ENCOUNTER — Other Ambulatory Visit: Payer: Self-pay | Admitting: Sports Medicine

## 2022-08-24 DIAGNOSIS — M180 Bilateral primary osteoarthritis of first carpometacarpal joints: Secondary | ICD-10-CM

## 2022-08-28 ENCOUNTER — Ambulatory Visit (INDEPENDENT_AMBULATORY_CARE_PROVIDER_SITE_OTHER): Payer: Medicare Other | Admitting: Pharmacist

## 2022-08-28 DIAGNOSIS — E7800 Pure hypercholesterolemia, unspecified: Secondary | ICD-10-CM

## 2022-08-28 DIAGNOSIS — E118 Type 2 diabetes mellitus with unspecified complications: Secondary | ICD-10-CM

## 2022-08-28 NOTE — Progress Notes (Signed)
Chronic Care Management Pharmacy Note  08/28/2022 Name:  Diana Parsons MRN:  [id number] DOB:  [birth year]  Summary: addressed HLD, HTN, and primarily DM. Patient assistance for trulicity continues to run smoothly, her paperwork is processed for 2024.  She states she will begin the increased trulicity dosing of 8.6PY this coming Saturday 09/01/22. She had left over 23m pens of trulicity so was using them up first. The goal is to increase trulicity dosing and if tolerated, come off metformin entirely. She describes she has had no issue with GI side effects from previous dose increases.  BG readings at home: 120-130s.   BP readings: 100/50s, asymptomatic with these softer pressures.  Recommendations/Changes made from today's visit: No changes at this time, patient doing well. Continue with current plan.   Plan: f/u with pharmacist in 1-2 months to assess trulicity dose increase   Subjective: Diana Parsons an 88y.o. year old female who is a primary patient of Metheney, CRene Kocher MD.  The CCM team was consulted for assistance with disease management and care coordination needs.    Engaged with patient by telephone for follow up visit in response to provider referral for pharmacy case management and/or care coordination services.   Consent to Services:  The patient was given information about Chronic Care Management services, agreed to services, and gave verbal consent prior to initiation of services.  Please see initial visit note for detailed documentation.   Patient Care Team: MHali Marry MD as PCP - General HArtist PaisMD (Orthopedic Surgery) KDarius Bump [facility] Pharmacist (Pharmacist)  03/06/21 JIran PlanasPA-C- Patient was seen for Great toe pain of the right foot. Labs were ordered and patient started a short course of Prednisone 50 MG for 5 days. Follow up with PCP prn.   02/27/21 CBeatrice LecherMD(PCP) Patient was seen for Controlled Type 2  diabetes. Labs were ordered [facility] Patients Dulaglutide was increased to 1.5 MG subcu weekly. Famotidine was discontinued and pt advised to follow up in 14 weeks for DM follow up.   02/03/21 CBeatrice LecherMD- Patient was seen for Colon cancer screening as a part of her Annual Wellness. [facility]Follow-up with MHali Marry MD as planned   12/26/20 CBeatrice LecherMD- Patient was seen for Microalbuminuria due to DM. Patient started Metformin 1000 MG daily and discontinued Dapagliflozin and Lisinopril 5 MG.   Recent consult visits:  None noted   Hospital visits:  None in previous 6 months    Objective:  Lab Results  Component Value Date   CREATININE 0.82 06/08/2022   CREATININE 0.72 02/06/2022   CREATININE 0.67 10/05/2021    Lab Results  Component Value Date   HGBA1C 6.5 06/07/2022   Last diabetic Eye exam:  Lab Results  Component Value Date/Time   HMDIABEYEEXA No Retinopathy 05/11/2020 12:00 AM    Last diabetic Foot exam:  Lab Results  Component Value Date/Time   HMDIABFOOTEX yes 02/07/2009 12:00 AM        Component Value Date/Time   CHOL 112 02/06/2022 0000   TRIG 134 02/06/2022 0000   HDL 44 (L) 02/06/2022 0000   CHOLHDL 2.5 02/06/2022 0000   VLDL 24 06/20/2016 0939   LDLCALC 46 02/06/2022 0000   LDLDIRECT 104 (H) 09/14/2011 0935       Latest Ref Rng & Units 06/08/2022   12:00 AM 02/06/2022   12:00 AM 11/18/2020  9:23 AM  Hepatic Function  Total Protein 6.1 - 8.1 g/dL 6.5  6.7  6.7   AST 10 - 35 U/L _0 ALT 6 - 29 U/L _1 Total Bilirubin 0.2 - 1.2 mg/dL 0.3  0.4  0.3     No results found for: "TSH", "FREET4"     Latest Ref Rng & Units 06/08/2022   12:00 AM  CBC  WBC 3.8 - 10.8 Thousand/uL 7.4   Hemoglobin 11.7 - 15.5 g/dL 12.5   Hematocrit 35.0 - 45.0 % 38.1   Platelets 140 - 400 Thousand/uL 168     Lab Results  Component Value Date/Time   VD25OH 32  07/22/2018 09:03 AM   VD25OH 22 (L) 11/24/2015 11:00 AM   Social History   Tobacco Use  Smoking Status Former   Packs/day: 0.10   Years: 3.00   Total pack years: 0.30   Types: Cigarettes  Smokeless Tobacco Former   BP Readings from Last 3 Encounters:  07/16/22 117/67  06/07/22 (!) 128/43  02/05/22 (!) 133/46   Pulse Readings from Last 3 Encounters:  07/16/22 86  06/07/22 80  02/05/22 88   Wt Readings from Last 3 Encounters:  06/07/22 223 lb (101.2 kg)  02/05/22 220 lb (99.8 kg)  10/05/21 222 lb (100.7 kg)    Assessment: Review of patient past medical history, allergies, medications, health status, including review of consultants reports, laboratory and other test data, was performed as part of comprehensive evaluation and provision of chronic care management services.   SDOH:  (Social Determinants of Health) assessments and interventions performed:  SDOH Interventions    Flowsheet Row Office Visit from 02/03/2021 in [facility] Interventions Intervention Not Indicated  Housing Interventions Intervention Not Indicated  Transportation Interventions Intervention Not Indicated  Financial Strain Interventions Intervention Not Indicated  Physical Activity Interventions Intervention Not Indicated  Stress Interventions Intervention Not Indicated  Social Connections Interventions Intervention Not Indicated       CCM Care Plan  No Known Allergies  Medications Reviewed Today     Reviewed by Dema Severin, CMA (Certified Medical Assistant) on 07/16/22 at 1417  Med List Status: <None>   Medication Order Taking? Sig Documenting Provider Last Dose Status Informant  aspirin 81 MG tablet [id number] Yes Take 81 mg by mouth daily. [provider] Taking Active   bisacodyl (DULCOLAX) 5 MG EC tablet [id number] Yes Take 5 mg by mouth daily as needed for moderate constipation. [provider] Taking Active   Blood Glucose Monitoring Suppl [facility] VERIO) w/Device KIT [id number] Yes USE TO TEST ONCE DAILY. DX: E11.9 Hali Marry, MD Taking Active   CALCIUM-MAGNESIUM-VITAMIN D PO [id number] Yes Take 3 tablets by mouth daily. 372m Calcium 1359mMagnesium 52m21minc 200 IU Vitamin D3 [provider] Taking Active   celecoxib (CELEBREX) 200 MG capsule [id number] One to 2 tablets by mouth daily as needed for pain. TheSilverio DecampD Taking Active   Dulaglutide (TRULICITY) 4.5 MG/JM/4.2ASPBonney [id number] Inject 4.5 mg as directed once a week. MetHali MarryD Taking Active   famotidine (PEPCID) 20 MG tablet [id number] Take 1 tablet (20 mg total) by mouth daily. MetHali MarryD Taking Active   fish oil-omega-3 fatty acids 1000 MG capsule [id number] Take 1 g by mouth daily. [provider] Taking Active   glucose  blood (ONETOUCH VERIO) test strip [id number] Yes Dx DM E11.8. Check fasting blood sugar every morning and after largest meal of the day a couple days a week. Hali Marry, MD Taking Active   Lancets (ONETOUCH DELICA PLUS STMHDQ22W) [place] [id number] Yes USE TO TEST TWICE A DAY Hali Marry, MD Taking Active   lisinopril (ZESTRIL) 5 MG tablet [id number] Yes Take 1 tablet (5 mg total) by mouth daily. Hali Marry, MD Taking Active   metFORMIN (GLUCOPHAGE-XR) 500 MG 24 hr tablet [id number] Yes Take 1 tablet (500 mg total) by mouth daily with breakfast. Hali Marry, MD Taking Active   Methylcobalamin 5000 MCG TBDP [id number] Yes Take 1 tablet by mouth daily. [provider] Taking Active   Multiple Vitamins-Minerals (PRESERVISION AREDS PO) [id number] Yes Take 1 tablet by mouth in the morning and at bedtime. [provider] Taking Active   polyethylene glycol (MIRALAX / GLYCOLAX) 17 g packet [id number] Yes Take 17 g by mouth daily as needed. [provider] Taking Active    rosuvastatin (CRESTOR) 20 MG tablet [id number] Yes TAKE 1 TABLET BY MOUTH EVERY DAY Hali Marry, MD Taking Active             Patient Active Problem List   Diagnosis Date Noted   Macular degeneration 06/07/2022   Primary osteoarthritis of both first carpometacarpal joints 06/05/2022   Osteopenia 03/07/2022   Chronic constipation 10/05/2021   Edema of right foot 03/06/2021   Great toe pain, right 03/06/2021   Combined forms of age-related cataract of left eye 06/02/2020   Aortic valve sclerosis 11/23/2019   Family history of lung cancer 11/09/2019   Orthopnea 11/09/2019   PND (paroxysmal nocturnal dyspnea) 11/09/2019   Esophagitis 11/09/2019   BMI 34.0-34.9,adult 07/03/2019   Microalbuminuria due to type 2 diabetes mellitus ([place]) 07/20/2016   Lumbar degenerative disc disease 05/03/2014   Status post lumbar surgery 09/03/2013   Spinal stenosis of lumbar region without neurogenic claudication 08/30/2013   Tear of meniscus of knee 05/29/2013   Primary osteoarthritis of left knee 05/28/2012   Controlled diabetes mellitus type 2 with complications ([place]) 66/29/4765   HYPERCHOLESTEROLEMIA, PURE 02/03/2007   BREAST MASS, LEFT 02/03/2007   POSTMENOPAUSAL STATUS 02/03/2007    Immunization History  Administered Date(s) Administered   Fluad Quad(high Dose 65+) 07/01/2019   Influenza Split 08/14/2011   Influenza Whole 07/15/2006, 08/12/2007, 08/06/2008, 09/06/2009, 06/30/2010   Influenza, High Dose Seasonal PF 07/20/2016, 06/25/2017, 07/17/2018, 07/22/2020, 08/22/2021   Influenza, Seasonal, Injecte, Preservative Fre 09/19/2012   Influenza,inj,Quad PF,6+ Mos 08/11/2013, 06/18/2014, 08/24/2015   PFIZER Comirnaty(Gray Top)Covid-19 Tri-Sucrose Vaccine 05/02/2021   PFIZER(Purple Top)SARS-COV-2 Vaccination 12/17/2019, 01/13/2020, 07/22/2020, 05/02/2021, 08/22/2021   Pfizer Covid-19 Vaccine Bivalent Booster 86yr & up 08/22/2021   Pneumococcal Conjugate-13 09/24/2014    Pneumococcal Polysaccharide-23 07/15/2006   Td 10/16/2003   Tdap 11/12/2013   Zoster Recombinat (Shingrix) 02/20/2017   Zoster, Live 10/15/2010    Conditions to be addressed/monitored: HLD and DMII  There are no care plans that you recently modified to display for this patient.        Medication Assistance: Application for Trulicity  medication assistance program. in process.  Anticipated assistance start date TBD.  See plan of care for additional detail.  [facility]Phone: [phone]: [phone] [phone] [place] [address]  [place] [address] Phone: [phone] Fax: [phone]   Uses pill box? Yes Pt endorses 100% compliance  Follow Up:  Patient agrees to Care Plan and Follow-up.  Plan: Telephone follow up appointment with care management team member scheduled for:  1-2 month  Larinda Buttery, PharmD Clinical Pharmacist [facility] [phone]

## 2022-09-03 ENCOUNTER — Other Ambulatory Visit: Payer: Self-pay | Admitting: Sports Medicine

## 2022-09-03 DIAGNOSIS — M180 Bilateral primary osteoarthritis of first carpometacarpal joints: Secondary | ICD-10-CM

## 2022-09-17 ENCOUNTER — Telehealth: Payer: Self-pay

## 2022-09-17 NOTE — Telephone Encounter (Signed)
Tamey left VM and states steroid shot she got on 08/23/22 did not work she wanted [place] to get her started on the gel supplementation please advise.

## 2022-09-18 ENCOUNTER — Other Ambulatory Visit: Payer: Self-pay | Admitting: Family Medicine

## 2022-09-18 DIAGNOSIS — E118 Type 2 diabetes mellitus with unspecified complications: Secondary | ICD-10-CM

## 2022-09-18 NOTE — Telephone Encounter (Signed)
Will do, routing back to you and Christal, Visco approval please, left knee only.

## 2022-09-20 NOTE — Telephone Encounter (Signed)
Submitted through My Vis[employer].

## 2022-09-25 NOTE — Telephone Encounter (Signed)
Benefits Investigation Details received from [employer] Injection: Orthovisc  Medical:Once the out of pocket has been met ,the patient is covered 100% PA required: No Pharmacy: Product is not covered through pharmacy plan.  Specialty Pharmacy: [facility]  May fill through: Buy and Reader Copay/Coinsurance: [phone] Product Copay: 20%(140) Administration Coinsurance: 0 Administration Copay: 0 Out of Pocket Max: [id number] (met: [id number]) .  Left VM for patient to call back and discuss.

## 2022-09-26 DIAGNOSIS — H3554 Dystrophies primarily involving the retinal pigment epithelium: Secondary | ICD-10-CM | POA: Diagnosis not present

## 2022-09-26 DIAGNOSIS — H353231 Exudative age-related macular degeneration, bilateral, with active choroidal neovascularization: Secondary | ICD-10-CM | POA: Diagnosis not present

## 2022-09-26 DIAGNOSIS — H35363 Drusen (degenerative) of macula, bilateral: Secondary | ICD-10-CM | POA: Diagnosis not present

## 2022-09-26 DIAGNOSIS — H3589 Other specified retinal disorders: Secondary | ICD-10-CM | POA: Diagnosis not present

## 2022-09-26 NOTE — Telephone Encounter (Signed)
I spoke with patient and she has an appointment on the 21st and that appointment will be for her gel injections.

## 2022-10-04 ENCOUNTER — Ambulatory Visit: Payer: Self-pay

## 2022-10-04 ENCOUNTER — Ambulatory Visit (INDEPENDENT_AMBULATORY_CARE_PROVIDER_SITE_OTHER): Payer: Medicare Other | Admitting: Sports Medicine

## 2022-10-04 DIAGNOSIS — M1712 Unilateral primary osteoarthritis, left knee: Secondary | ICD-10-CM

## 2022-10-04 MED ORDER — HYALURONAN 30 MG/2ML IX SOSY
30.0000 mg | PREFILLED_SYRINGE | Freq: Once | INTRA_ARTICULAR | Status: AC
  Administered 2022-10-04: 30 mg via INTRA_ARTICULAR

## 2022-10-04 NOTE — Progress Notes (Signed)
    Procedures performed today:    Procedure: Real-time Ultrasound Guided injection of the left knee Device: Samsung HS60  Verbal informed consent obtained.  Time-out conducted.  Noted no overlying erythema, induration, or other signs of local infection.  Skin prepped in a sterile fashion.  Local anesthesia: Topical Ethyl chloride.  With sterile technique and under real time ultrasound guidance: Mild effusion noted, 30 mg/2 mL of OrthoVisc (sodium hyaluronate) in a prefilled syringe was injected easily into the knee through a 22-gauge needle. Completed without difficulty  Advised to call if fevers/chills, erythema, induration, drainage, or persistent bleeding.  Images permanently stored and available for review in PACS.  Impression: Technically successful ultrasound guided injection.  Independent interpretation of notes and tests performed by another provider:   None.  Brief History, Exam, Impression, and Recommendations:    Primary osteoarthritis of left knee Orthovisc 1 of 4 at left knee, return in 1 week for #2 of 4, we last did Orthovisc in 2014 and she did well.    ____________________________________________ Gwen Her. Dianah Field, M.D., ABFM., CAQSM., AME. Primary Care and Sports Medicine [place] [facility]  Adjunct Professor of [place] of [facility] of Medicine  Risk manager

## 2022-10-04 NOTE — Assessment & Plan Note (Signed)
Orthovisc 1 of 4 at left knee, return in 1 week for #2 of 4, we last did Orthovisc in 2014 and she did well.

## 2022-10-10 ENCOUNTER — Ambulatory Visit (INDEPENDENT_AMBULATORY_CARE_PROVIDER_SITE_OTHER): Payer: Medicare Other | Admitting: Family Medicine

## 2022-10-10 ENCOUNTER — Encounter: Payer: Self-pay | Admitting: Family Medicine

## 2022-10-10 VITALS — BP 130/43 | HR 90 | Ht 66.0 in | Wt 225.0 lb

## 2022-10-10 DIAGNOSIS — R809 Proteinuria, unspecified: Secondary | ICD-10-CM | POA: Diagnosis not present

## 2022-10-10 DIAGNOSIS — E118 Type 2 diabetes mellitus with unspecified complications: Secondary | ICD-10-CM

## 2022-10-10 DIAGNOSIS — E1129 Type 2 diabetes mellitus with other diabetic kidney complication: Secondary | ICD-10-CM

## 2022-10-10 LAB — POCT GLYCOSYLATED HEMOGLOBIN (HGB A1C): Hemoglobin A1C: 7.6 % — AB (ref 4.0–5.6)

## 2022-10-10 NOTE — Patient Instructions (Signed)
Please restart what metformin you have left, 1 a day in the morning.  If you feel like you need a refill after couple of weeks then please just give me a call back.  Otherwise I will see you back in 3 months and we will make sure that the A1c is coming down nicely.

## 2022-10-10 NOTE — Assessment & Plan Note (Signed)
A1c is elevated at 7.6 today which is a significant jump from previous of 6.5.  She still has a couple of weeks of metformin left so she is can to restart that and during that time start to make some changes to her diet.  She will call me and let me know if her blood sugars are still a little elevated she did bring in her log today and we will get that scanned in.

## 2022-10-10 NOTE — Assessment & Plan Note (Signed)
Continue daily ACE inhibitor.

## 2022-10-10 NOTE — Progress Notes (Signed)
   Established Patient Office Visit  Subjective   Patient ID: Diana Parsons, female    DOB: 01-13-1934  Age: 88 y.o. MRN: [id number]  No chief complaint on file.   HPI  4 mo f/u for   Diabetes - no hypoglycemic events. No wounds or sores that are not healing well. No increased thirst or urination. Checking glucose at home. Taking medications as prescribed without any side effects.  She is up to 4.5 mg and Trulicity is doing well.  She discontinued the metformin.  She has been eating a little bit more bread lately with bagels in the morning and sandwiches at lunch.  She has been active in volunteering at her church.  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    ROS    Objective:     BP (!) 130/43   Pulse 90   Ht '5\' 6"'$  (1.676 m)   Wt 225 lb (102.1 kg)   SpO2 96%   BMI 36.32 kg/m    Physical Exam Vitals and nursing note reviewed.  Constitutional:      Appearance: She is well-developed.  HENT:     Head: Normocephalic and atraumatic.  Cardiovascular:     Rate and Rhythm: Normal rate and regular rhythm.     Heart sounds: Normal heart sounds.  Pulmonary:     Effort: Pulmonary effort is normal.     Breath sounds: Normal breath sounds.  Skin:    General: Skin is warm and dry.  Neurological:     Mental Status: She is alert and oriented to person, place, and time.  Psychiatric:        Behavior: Behavior normal.      Results for orders placed or performed in visit on 10/10/22  POCT glycosylated hemoglobin (Hb A1C)  Result Value Ref Range   Hemoglobin A1C 7.6 (A) 4.0 - 5.6 %   HbA1c POC (<> result, manual entry)     HbA1c, POC (prediabetic range)     HbA1c, POC (controlled diabetic range)        The ASCVD Risk score (Arnett DK, et al., 2019) failed to calculate for the following reasons:   The 2019 ASCVD risk score is only valid for ages 4 to 67    Assessment & Plan:   Problem List Items Addressed This Visit        Endocrine   Microalbuminuria due to type 2 diabetes mellitus ([place])    Continue daily ACE inhibitor.      Controlled diabetes mellitus type 2 with complications (HCC) - Primary    A1c is elevated at 7.6 today which is a significant jump from previous of 6.5.  She still has a couple of weeks of metformin left so she is can to restart that and during that time start to make some changes to her diet.  She will call me and let me know if her blood sugars are still a little elevated she did bring in her log today and we will get that scanned in.      Relevant Orders   POCT glycosylated hemoglobin (Hb A1C) (Completed)    Return in about 3 months (around 01/09/2023) for Diabetes follow-up.    [name], MD

## 2022-10-12 ENCOUNTER — Ambulatory Visit (INDEPENDENT_AMBULATORY_CARE_PROVIDER_SITE_OTHER): Payer: Medicare Other | Admitting: Sports Medicine

## 2022-10-12 ENCOUNTER — Ambulatory Visit (INDEPENDENT_AMBULATORY_CARE_PROVIDER_SITE_OTHER): Payer: Medicare Other

## 2022-10-12 DIAGNOSIS — M1712 Unilateral primary osteoarthritis, left knee: Secondary | ICD-10-CM

## 2022-10-12 MED ORDER — HYALURONAN 30 MG/2ML IX SOSY
30.0000 mg | PREFILLED_SYRINGE | Freq: Once | INTRA_ARTICULAR | Status: AC
  Administered 2022-10-12: 30 mg via INTRA_ARTICULAR

## 2022-10-12 NOTE — Progress Notes (Signed)
    Procedures performed today:    Procedure: Real-time Ultrasound Guided injection of the left knee Device: Samsung HS60  Verbal informed consent obtained.  Time-out conducted.  Noted no overlying erythema, induration, or other signs of local infection.  Skin prepped in a sterile fashion.  Local anesthesia: Topical Ethyl chloride.  With sterile technique and under real time ultrasound guidance: Mild effusion noted, 30 mg/2 mL of OrthoVisc (sodium hyaluronate) in a prefilled syringe was injected easily into the knee through a 22-gauge needle. Completed without difficulty  Advised to call if fevers/chills, erythema, induration, drainage, or persistent bleeding.  Images permanently stored and available for review in PACS.  Impression: Technically successful ultrasound guided injection.  Independent interpretation of notes and tests performed by another provider:   None.  Brief History, Exam, Impression, and Recommendations:    Primary osteoarthritis of left knee Orthovisc 2 of 4 at left knee, return in 1 week for #3 of 4, she did well back in 2014 with Orthovisc.    ____________________________________________ Gwen Her. Dianah Field, M.D., ABFM., CAQSM., AME. Primary Care and Sports Medicine [place] [facility]  Adjunct Professor of [place] [employer] [facility] of Medicine  Risk manager

## 2022-10-12 NOTE — Assessment & Plan Note (Signed)
Orthovisc 2 of 4 at left knee, return in 1 week for #3 of 4, she did well back in 2014 with Orthovisc.

## 2022-10-19 ENCOUNTER — Ambulatory Visit (INDEPENDENT_AMBULATORY_CARE_PROVIDER_SITE_OTHER): Payer: Medicare Other

## 2022-10-19 ENCOUNTER — Ambulatory Visit (INDEPENDENT_AMBULATORY_CARE_PROVIDER_SITE_OTHER): Payer: Medicare Other | Admitting: Sports Medicine

## 2022-10-19 DIAGNOSIS — M1712 Unilateral primary osteoarthritis, left knee: Secondary | ICD-10-CM

## 2022-10-19 MED ORDER — HYALURONAN 30 MG/2ML IX SOSY
30.0000 mg | PREFILLED_SYRINGE | Freq: Once | INTRA_ARTICULAR | Status: AC
  Administered 2022-10-19: 30 mg via INTRA_ARTICULAR

## 2022-10-19 NOTE — Assessment & Plan Note (Signed)
Orthovisc No. 3 of 4 left knee, return in 1 week for #4 of 4.

## 2022-10-19 NOTE — Progress Notes (Signed)
    Procedures performed today:    Procedure: Real-time Ultrasound Guided injection of the left knee Device: Samsung HS60  Verbal informed consent obtained.  Time-out conducted.  Noted no overlying erythema, induration, or other signs of local infection.  Skin prepped in a sterile fashion.  Local anesthesia: Topical Ethyl chloride.  With sterile technique and under real time ultrasound guidance: Mild effusion noted, 30 mg/2 mL of OrthoVisc (sodium hyaluronate) in a prefilled syringe was injected easily into the knee through a 22-gauge needle. Completed without difficulty  Advised to call if fevers/chills, erythema, induration, drainage, or persistent bleeding.  Images permanently stored and available for review in PACS.  Impression: Technically successful ultrasound guided injection.  Independent interpretation of notes and tests performed by another provider:   None.  Brief History, Exam, Impression, and Recommendations:    Primary osteoarthritis of left knee Orthovisc No. 3 of 4 left knee, return in 1 week for #4 of 4.    ____________________________________________ Gwen Her. Dianah Field, M.D., ABFM., CAQSM., AME. Primary Care and Sports Medicine [place] [facility]  Adjunct Professor of [place] of [facility] of Medicine  Risk manager

## 2022-10-26 ENCOUNTER — Ambulatory Visit (INDEPENDENT_AMBULATORY_CARE_PROVIDER_SITE_OTHER): Payer: Medicare Other | Admitting: Sports Medicine

## 2022-10-26 ENCOUNTER — Ambulatory Visit (INDEPENDENT_AMBULATORY_CARE_PROVIDER_SITE_OTHER): Payer: Medicare Other

## 2022-10-26 DIAGNOSIS — M1712 Unilateral primary osteoarthritis, left knee: Secondary | ICD-10-CM

## 2022-10-26 MED ORDER — HYALURONAN 30 MG/2ML IX SOSY
30.0000 mg | PREFILLED_SYRINGE | Freq: Once | INTRA_ARTICULAR | Status: AC
  Administered 2022-10-26: 30 mg via INTRA_ARTICULAR

## 2022-10-26 NOTE — Progress Notes (Signed)
    Procedures performed today:    Procedure: Real-time Ultrasound Guided injection of the left knee Device: Samsung HS60  Verbal informed consent obtained.  Time-out conducted.  Noted no overlying erythema, induration, or other signs of local infection.  Skin prepped in a sterile fashion.  Local anesthesia: Topical Ethyl chloride.  With sterile technique and under real time ultrasound guidance: Mild effusion noted, 30 mg/2 mL of OrthoVisc (sodium hyaluronate) in a prefilled syringe was injected easily into the knee through a 22-gauge needle. Completed without difficulty  Advised to call if fevers/chills, erythema, induration, drainage, or persistent bleeding.  Images permanently stored and available for review in PACS.  Impression: Technically successful ultrasound guided injection.  Independent interpretation of notes and tests performed by another provider:   None.  Brief History, Exam, Impression, and Recommendations:    Primary osteoarthritis of left knee Orthovisc 4 of 4 left knee, patient is feeling pretty good today with regards to her knee, she is having a bit of discomfort as a low pressure system moves then but overall much better than before the injection return 6 weeks as needed.    ____________________________________________ Gwen Her. Dianah Field, M.D., ABFM., CAQSM., AME. Primary Care and Sports Medicine [place] [facility]  Adjunct Professor of [place] [employer] [facility] of Medicine  Risk manager

## 2022-10-26 NOTE — Addendum Note (Signed)
Addended by: Tarri Glenn A on: 10/26/2022 02:42 PM   Modules accepted: Orders

## 2022-10-26 NOTE — Assessment & Plan Note (Addendum)
Orthovisc 4 of 4 left knee, patient is feeling pretty good today with regards to her knee, she is having a bit of discomfort as a low pressure system moves then but overall much better than before the injection return 6 weeks as needed.

## 2022-11-22 ENCOUNTER — Other Ambulatory Visit: Payer: Self-pay | Admitting: Sports Medicine

## 2022-11-22 DIAGNOSIS — M180 Bilateral primary osteoarthritis of first carpometacarpal joints: Secondary | ICD-10-CM

## 2022-11-24 ENCOUNTER — Encounter: Payer: Self-pay | Admitting: *Deleted

## 2022-11-24 ENCOUNTER — Ambulatory Visit
Admission: EM | Admit: 2022-11-24 | Discharge: 2022-11-24 | Disposition: A | Discharge status: Home / Self Care | Payer: Medicare Other | Attending: Urgent Care | Admitting: Urgent Care

## 2022-11-24 ENCOUNTER — Other Ambulatory Visit: Payer: Self-pay

## 2022-11-24 DIAGNOSIS — U071 COVID-19: Secondary | ICD-10-CM

## 2022-11-24 LAB — POC SARS CORONAVIRUS 2 AG -  ED: SARS Coronavirus 2 Ag: POSITIVE — AB

## 2022-11-24 MED ORDER — PAXLOVID (300/100) 20 X 150 MG & 10 X 100MG PO TBPK: 3.0000 | ORAL_TABLET | Freq: Two times a day (BID) | ORAL | 0 refills | Status: AC

## 2022-11-24 NOTE — Discharge Instructions (Addendum)
You are positive for covid. Read the attached handouts on covid and the antiviral treatment.  Start paxlovid today; take 3 tabs twice daily for 5 days. Monitor for adverse reactions, most commonly nausea, vomiting, diarrhea or headache. If these occur, you may stop the medication.  Rest and stay hydrated with water. Must quarantine x 5 days total, wear N95 mask when out for 5 days after isolation period is completed. Please monitor regression of your symptoms. Some people have tried OTC Quercetin to help fight off illness. OTC oscillococcinum can help with fatigue or body aches.  If any new or worsening symptoms develops, particularly uncontrollable fever, severe shortness of breath or chest pain, please head to the ER.

## 2022-11-24 NOTE — ED Triage Notes (Signed)
Pt states 2 days ago she started with congestion, cough, sneezing. She has been taking mucinex DM yesterday. She took a at home covid test and it was positive today

## 2022-11-24 NOTE — ED Provider Notes (Signed)
[name]    CSN: [id number] Arrival date & time: 11/24/22  1110      History   Chief Complaint Chief Complaint  Patient presents with   Covid Positive   Nasal Congestion    HPI Diana Parsons is a 89 y.o. female.   Pleasant 89 year old female presents today due to concerns of possible COVID.  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  Patient states on Thursday she started having mild nasal congestion and a cough.  She did not go to work on Friday.  She took some over-the-counter Mucinex daytime and nighttime, and states that that seemed to resolve her symptoms.  She feels much better today than she did yesterday.  She never had a fever, checks her temperature at home and it was 98.  States the cough has improved and denies shortness of breath or wheezing.  She took a COVID test today at home and states that the C-line never showed up at the T line was there and is concerned that it possibly is positive.  She states she has had all of her COVID vaccines and boosters, and has never had COVID to date.     Past Medical History:  Diagnosis Date   Arthritis    Cancer ([place])    Cataract    Diabetes mellitus without complication ([place])    GERD (gastroesophageal reflux disease)     Patient Active Problem List   Diagnosis Date Noted   Macular degeneration 06/07/2022   Primary osteoarthritis of both first carpometacarpal joints 06/05/2022   Osteopenia 03/07/2022   Chronic constipation 10/05/2021   Edema of right foot 03/06/2021   Great toe pain, right 03/06/2021   Combined forms of age-related cataract of left eye 06/02/2020   Aortic valve sclerosis 11/23/2019   Family history of lung cancer 11/09/2019   Orthopnea 11/09/2019   PND (paroxysmal nocturnal dyspnea) 11/09/2019   Esophagitis 11/09/2019   BMI 34.0-34.9,adult 07/03/2019   Microalbuminuria due to type 2  diabetes mellitus ([place]) 07/20/2016   Lumbar degenerative disc disease 05/03/2014   Status post lumbar surgery 09/03/2013   Spinal stenosis of lumbar region without neurogenic claudication 08/30/2013   Tear of meniscus of knee 05/29/2013   Primary osteoarthritis of left knee 05/28/2012   Controlled diabetes mellitus type 2 with complications ([place]) XX123456   HYPERCHOLESTEROLEMIA, PURE 02/03/2007   BREAST MASS, LEFT 02/03/2007   POSTMENOPAUSAL STATUS 02/03/2007    Past Surgical History:  Procedure Laterality Date   ABDOMINAL HYSTERECTOMY  89 yrs old   EYE SURGERY  Cataracts august 2021   left shoulder sx s/p fall  10/16/1999   has pin in place   lumbarp spine surgery  07/15/2014   Dr. Lurene Shadow   SPINE SURGERY      OB History   No obstetric history on file.      Home Medications    Prior to Admission medications   Medication Sig Start Date End Date Taking? Authorizing Provider  aspirin 81 MG tablet Take 81 mg by mouth daily.   Yes [provider]  bisacodyl (DULCOLAX) 5 MG EC tablet Take 5 mg by mouth daily as needed for moderate constipation.   Yes [provider]  Blood Glucose Monitoring Suppl (ONETOUCH VERIO) w/Device KIT USE TO TEST ONCE DAILY. DX: E11.9 06/14/21  Yes Hali Marry, MD  CALCIUM-MAGNESIUM-VITAMIN D PO Take 3 tablets by  mouth daily. 3253m Calcium 1377mMagnesium 53m153minc 200 IU Vitamin D3   Yes [provider]  celecoxib (CELEBREX) 200 MG capsule Take 1 to 2 capsules by mouth daily as needed for pain. 09/03/22  Yes TheSilverio DecampD  Dulaglutide (TRULICITY) 4.5 MG/0000000PN Inject 4.5 mg as directed once a week. 06/07/22  Yes MetHali MarryD  famotidine (PEPCID) 20 MG tablet Take 1 tablet (20 mg total) by mouth daily. 12/13/21  Yes MetHali MarryD  fish oil-omega-3 fatty acids 1000 MG capsule Take 1 g by mouth daily.   Yes [provider]  glucose blood (ONETOUCH VERIO) test strip Dx DM  E11.8. Check fasting blood sugar every morning and after largest meal of the day a couple days a week. 12/13/21  Yes MetHali MarryD  Lancets ([facility] PLUS [id number] USE TO TEST TWICE A DAY 08/15/21  Yes MetHali MarryD  lisinopril (ZESTRIL) 5 MG tablet Take 1 tablet (5 mg total) by mouth daily. 12/13/21  Yes MetHali MarryD  nirmatrelvir & ritonavir (PAXLOVID, 300/100,) 20 x 150 MG & 10 x 100MG TBPK Take 3 tablets by mouth 2 (two) times daily for 5 days. 11/24/22 11/29/22 Yes Tamirra Sienkiewicz L, PA  rosuvastatin (CRESTOR) 20 MG tablet TAKE 1 TABLET BY MOUTH EVERY DAY 01/11/22  Yes MetHali MarryD  polyethylene glycol (MIRALAX / GLYCOLAX) 17 g packet Take 17 g by mouth daily as needed.    [provider]    Family History Family History  Problem Relation Age of Onset   Cancer Mother        colon   Diabetes Sister    Diabetes Brother    Cancer Brother        lung cancer    Social History Social History   Tobacco Use   Smoking status: Former    Packs/day: 0.10    Years: 3.00    Total pack years: 0.30    Types: Cigarettes   Smokeless tobacco: Former  Substance Use Topics   Alcohol use: No   Drug use: No     Allergies   Patient has no known allergies.   Review of Systems Review of Systems As per HPI  Physical Exam Triage Vital Signs ED Triage Vitals  Enc Vitals Group     BP 11/24/22 1131 139/75     Pulse Rate 11/24/22 1131 94     Resp 11/24/22 1131 20     Temp 11/24/22 1131 98.8 F (37.1 C)     Temp Source 11/24/22 1131 Oral     SpO2 11/24/22 1131 95 %     Weight --      Height --      Head Circumference --      Peak Flow --      Pain Score 11/24/22 1127 0     Pain Loc --      Pain Edu? --      Excl. in [place]-    No data found.  Updated Vital Signs BP 139/75 (BP Location: Left Arm)   Pulse 94   Temp 98.8 F (37.1 C) (Oral)   Resp 20   SpO2 95%   Visual Acuity Right Eye Distance:   Left Eye  Distance:   Bilateral Distance:    Right Eye Near:   Left Eye Near:    Bilateral Near:     Physical Exam Vitals and nursing note reviewed.  Constitutional:      General: She is not in acute distress.    Appearance: Normal appearance. She is well-developed. She is not ill-appearing, toxic-appearing or diaphoretic.  HENT:     Head: Normocephalic and atraumatic.     Right Ear: Tympanic membrane, ear canal and external ear normal. There is no impacted cerumen.     Left Ear: Tympanic membrane, ear canal and external ear normal. There is no impacted cerumen.     Nose: Nose normal. No congestion or rhinorrhea.     Mouth/Throat:     Mouth: Mucous membranes are moist.     Pharynx: Oropharynx is clear. No oropharyngeal exudate or posterior oropharyngeal erythema.  Eyes:     General: No scleral icterus.       Right eye: No discharge.        Left eye: No discharge.     Extraocular Movements: Extraocular movements intact.     Conjunctiva/sclera: Conjunctivae normal.     Pupils: Pupils are equal, round, and reactive to light.  Cardiovascular:     Rate and Rhythm: Normal rate and regular rhythm.     Heart sounds: No murmur heard. Pulmonary:     Effort: Pulmonary effort is normal. No accessory muscle usage, respiratory distress or retractions.     Breath sounds: Normal breath sounds and air entry. No stridor, decreased air movement or transmitted upper airway sounds. No decreased breath sounds, wheezing, rhonchi or rales.  Chest:     Chest wall: No tenderness.  Musculoskeletal:        General: No swelling.     Cervical back: Normal range of motion and neck supple. No rigidity or tenderness.  Lymphadenopathy:     Cervical: No cervical adenopathy.  Skin:    General: Skin is warm and dry.     Capillary Refill: Capillary refill takes less than 2 seconds.     Findings: No erythema or rash.  Neurological:     General: No focal deficit present.     Mental Status: She is alert and oriented to  person, place, and time.  Psychiatric:        Mood and Affect: Mood normal.      UC Treatments / Results  Labs (all labs ordered are listed, but only abnormal results are displayed) Labs Reviewed  POC SARS CORONAVIRUS 2 AG -  ED - Abnormal; Notable for the following components:      Result Value   SARS Coronavirus 2 Ag Positive (*)    All other components within normal limits    EKG   Radiology No results found.  Procedures Procedures (including critical care time)  Medications Ordered in UC Medications - No data to display  Initial Impression / Assessment and Plan / UC Course  I have reviewed the triage vital signs and the nursing notes.  Pertinent labs & imaging results that were available during my care of the patient were reviewed by me and considered in my medical decision making (see chart for details).     Covid-19 - test confirmed in office that pt does have covid. Pt is on day 3 of sx. Given age and risk factors, will start paxlovid. No Rx interactions. Normal renal function. ER precautions discussed. Additional OTC medications reviewed.   Final Clinical Impressions(s) / UC Diagnoses   Final diagnoses:  [id number]     Discharge Instructions      You are positive for covid. Read the attached handouts on covid and the antiviral treatment.  Start paxlovid today; take 3 tabs twice daily for 5 days. Monitor for adverse reactions, most commonly nausea, vomiting, diarrhea or headache. If these occur, you may stop the medication.  Rest and stay hydrated with water. Must quarantine x 5 days total, wear N95 mask when out for 5 days after isolation period is completed. Please monitor regression of your symptoms. Some people have tried OTC Quercetin to help fight off illness. OTC oscillococcinum can help with fatigue or body aches.  If any new or worsening symptoms develops, particularly uncontrollable fever, severe shortness of breath or chest pain, please head  to the ER.      ED Prescriptions     Medication Sig Dispense Auth. Provider   nirmatrelvir & ritonavir (PAXLOVID, 300/100,) 20 x 150 MG & 10 x 100MG TBPK Take 3 tablets by mouth 2 (two) times daily for 5 days. 30 tablet Cheyanna Strick L, [place]      PDMP not reviewed this encounter.   Chaney Malling, [place] 11/24/22 1233

## 2022-12-10 ENCOUNTER — Other Ambulatory Visit: Payer: Self-pay | Admitting: Sports Medicine

## 2022-12-10 DIAGNOSIS — M180 Bilateral primary osteoarthritis of first carpometacarpal joints: Secondary | ICD-10-CM

## 2022-12-21 DIAGNOSIS — R0982 Postnasal drip: Secondary | ICD-10-CM | POA: Diagnosis not present

## 2022-12-26 ENCOUNTER — Other Ambulatory Visit: Payer: Self-pay | Admitting: Family Medicine

## 2022-12-26 ENCOUNTER — Ambulatory Visit
Admission: EM | Admit: 2022-12-26 | Discharge: 2022-12-26 | Disposition: A | Discharge status: Home / Self Care | Payer: Medicare Other | Attending: Emergency Medicine | Admitting: Emergency Medicine

## 2022-12-26 ENCOUNTER — Encounter: Payer: Self-pay | Admitting: Emergency Medicine

## 2022-12-26 DIAGNOSIS — S61211A Laceration without foreign body of left index finger without damage to nail, initial encounter: Secondary | ICD-10-CM

## 2022-12-26 DIAGNOSIS — Z23 Encounter for immunization: Secondary | ICD-10-CM

## 2022-12-26 DIAGNOSIS — E118 Type 2 diabetes mellitus with unspecified complications: Secondary | ICD-10-CM

## 2022-12-26 MED ORDER — TETANUS-DIPHTH-ACELL PERTUSSIS 5-2.5-18.5 LF-MCG/0.5 IM SUSY
0.5000 mL | PREFILLED_SYRINGE | Freq: Once | INTRAMUSCULAR | Status: AC
  Administered 2022-12-26: 0.5 mL via INTRAMUSCULAR

## 2022-12-26 NOTE — ED Provider Notes (Signed)
[facility]    CSN: [id number] Arrival date & time: 12/26/22  1252      History   Chief Complaint Chief Complaint  Patient presents with   Laceration    HPI Diana Parsons is a 89 y.o. female.  Cut finger on knife about 24 hours ago Took the bandaid off this morning and it started bleeding again Not anticoagulated. Takes daily aspirin  Last tetanus over 20 years ago  Past Medical History:  Diagnosis Date   Arthritis    Cancer ([place])    Cataract    Diabetes mellitus without complication ([place])    GERD (gastroesophageal reflux disease)     Patient Active Problem List   Diagnosis Date Noted   Macular degeneration 06/07/2022   Primary osteoarthritis of both first carpometacarpal joints 06/05/2022   Osteopenia 03/07/2022   Chronic constipation 10/05/2021   Edema of right foot 03/06/2021   Great toe pain, right 03/06/2021   Combined forms of age-related cataract of left eye 06/02/2020   Aortic valve sclerosis 11/23/2019   Family history of lung cancer 11/09/2019   Orthopnea 11/09/2019   PND (paroxysmal nocturnal dyspnea) 11/09/2019   Esophagitis 11/09/2019   BMI 34.0-34.9,adult 07/03/2019   Microalbuminuria due to type 2 diabetes mellitus ([place]) 07/20/2016   Lumbar degenerative disc disease 05/03/2014   Status post lumbar surgery 09/03/2013   Spinal stenosis of lumbar region without neurogenic claudication 08/30/2013   Tear of meniscus of knee 05/29/2013   Primary osteoarthritis of left knee 05/28/2012   Controlled diabetes mellitus type 2 with complications ([place]) XX123456   HYPERCHOLESTEROLEMIA, PURE 02/03/2007   BREAST MASS, LEFT 02/03/2007   POSTMENOPAUSAL STATUS 02/03/2007    Past Surgical History:  Procedure Laterality Date   ABDOMINAL HYSTERECTOMY  89 yrs old   EYE SURGERY  Cataracts august 2021   left shoulder sx s/p fall  10/16/1999   has pin in place   lumbarp spine surgery  07/15/2014   Dr. Lurene Shadow   SPINE SURGERY      OB History    No obstetric history on file.      Home Medications    Prior to Admission medications   Medication Sig Start Date End Date Taking? Authorizing Provider  aspirin 81 MG tablet Take 81 mg by mouth daily.   Yes [provider]  bisacodyl (DULCOLAX) 5 MG EC tablet Take 5 mg by mouth daily as needed for moderate constipation.   Yes [provider]  Blood Glucose Monitoring Suppl (ONETOUCH VERIO) w/Device KIT USE TO TEST ONCE DAILY. DX: E11.9 06/14/21  Yes Hali Marry, MD  CALCIUM-MAGNESIUM-VITAMIN D PO Take 3 tablets by mouth daily. '333mg'$  Calcium '133mg'$  Magnesium '5mg'$  Zinc 200 IU Vitamin D3   Yes [provider]  celecoxib (CELEBREX) 200 MG capsule Take 1 to 2 capsules by mouth daily as needed for pain. 12/10/22  Yes Silverio Decamp, MD  Dulaglutide (TRULICITY) 4.5 0000000 SOPN Inject 4.5 mg as directed once a week. 06/07/22  Yes Hali Marry, MD  famotidine (PEPCID) 20 MG tablet Take 1 tablet (20 mg total) by mouth daily. 12/13/21  Yes Hali Marry, MD  fish oil-omega-3 fatty acids 1000 MG capsule Take 1 g by mouth daily.   Yes [provider]  glucose blood (ONETOUCH VERIO) test strip Dx DM E11.8. Check fasting blood sugar every morning and after largest meal of the day a couple days a week. 12/13/21  Yes Hali Marry, MD  Lancets [facility] Donaciano Eva  PLUS [id number]) MISC USE TO TEST TWICE A DAY 08/15/21  Yes Hali Marry, MD  lisinopril (ZESTRIL) 5 MG tablet Take 1 tablet (5 mg total) by mouth daily. 12/13/21  Yes Hali Marry, MD  polyethylene glycol (MIRALAX / GLYCOLAX) 17 g packet Take 17 g by mouth daily as needed.   Yes [provider]  rosuvastatin (CRESTOR) 20 MG tablet TAKE 1 TABLET BY MOUTH EVERY DAY 01/11/22  Yes Hali Marry, MD    Family History Family History  Problem Relation Age of Onset   Cancer Mother        colon   Diabetes Sister    Diabetes Brother    Cancer Brother         lung cancer    Social History Social History   Tobacco Use   Smoking status: Former    Packs/day: 0.10    Years: 3.00    Total pack years: 0.30    Types: Cigarettes   Smokeless tobacco: Former  Substance Use Topics   Alcohol use: No   Drug use: No     Allergies   Patient has no known allergies.   Review of Systems Review of Systems As per HPI  Physical Exam Triage Vital Signs ED Triage Vitals  Enc Vitals Group     BP 12/26/22 1307 (!) 152/73     Pulse Rate 12/26/22 1307 97     Resp 12/26/22 1307 16     Temp 12/26/22 1307 98.6 F (37 C)     Temp Source 12/26/22 1307 Oral     SpO2 12/26/22 1307 96 %     Weight --      Height --      Head Circumference --      Peak Flow --      Pain Score 12/26/22 1310 0     Pain Loc --      Pain Edu? --      Excl. in [place]? --    No data found.  Updated Vital Signs BP (!) 152/73 (BP Location: Left Arm)   Pulse 97   Temp 98.6 F (37 C) (Oral)   Resp 16   SpO2 96%    Physical Exam Vitals and nursing note reviewed.  Constitutional:      General: She is not in acute distress. Cardiovascular:     Pulses: Normal pulses.  Musculoskeletal:        General: No swelling or tenderness. Normal range of motion.  Skin:    General: Skin is warm and dry.     Capillary Refill: Capillary refill takes less than 2 seconds.     Findings: Wound present.     Comments: Left hand index finger has a slice of skin missing from pad. Trickle of bleeding  Neurological:     Mental Status: She is alert and oriented to person, place, and time.    UC Treatments / Results  Labs (all labs ordered are listed, but only abnormal results are displayed) Labs Reviewed - No data to display  EKG   Radiology No results found.  Procedures Procedures (including critical care time)  Medications Ordered in UC Medications  Tdap (BOOSTRIX) injection 0.5 mL (0.5 mLs Intramuscular Given 12/26/22 1359)    Initial Impression / Assessment and  Plan / UC Course  I have reviewed the triage vital signs and the nursing notes.  Pertinent labs & imaging results that were available during my care of the patient were reviewed by  me and considered in my medical decision making (see chart for details).  Pressure dressing applied, hand elevated. Bleeding stopped on re-evaluation. Applied abx ointment and clean dressing. Tetanus updated today Discussed wound care, signs of infection to watch for  Final Clinical Impressions(s) / UC Diagnoses   Final diagnoses:  Laceration of left index finger without foreign body without damage to nail, initial encounter     Discharge Instructions      We have updated your tetanus vaccine today.  Please take the bandage off tonight and apply antibiotic ointment. I recommend using antibiotic ointment twice daily for the next several days. Keep the area clean and dry.  You can cover with bandage as needed.  Monitor for signs of infection such as increased pain, swelling, redness, drainage.  Return as needed     ED Prescriptions   None    PDMP not reviewed this encounter.   Les Pou, [place] 12/26/22 1439

## 2022-12-26 NOTE — Discharge Instructions (Addendum)
We have updated your tetanus vaccine today.  Please take the bandage off tonight and apply antibiotic ointment. I recommend using antibiotic ointment twice daily for the next several days. Keep the area clean and dry.  You can cover with bandage as needed.  Monitor for signs of infection such as increased pain, swelling, redness, drainage.  Return as needed

## 2022-12-26 NOTE — ED Triage Notes (Signed)
Patient states that she cut her left index finger yesterday while cutting vegetables.  She did place finger under cold water and a pressure dressing on finger.  Patient removed dressing this morning and it started bleeding again.  Patient unsure of last TDAP.

## 2023-01-10 ENCOUNTER — Ambulatory Visit (INDEPENDENT_AMBULATORY_CARE_PROVIDER_SITE_OTHER): Payer: Medicare Other | Admitting: Family Medicine

## 2023-01-10 VITALS — BP 136/46 | HR 99 | Ht 66.0 in | Wt 224.0 lb

## 2023-01-10 DIAGNOSIS — W010XXA Fall on same level from slipping, tripping and stumbling without subsequent striking against object, initial encounter: Secondary | ICD-10-CM

## 2023-01-10 DIAGNOSIS — R809 Proteinuria, unspecified: Secondary | ICD-10-CM | POA: Diagnosis not present

## 2023-01-10 DIAGNOSIS — H3530 Unspecified macular degeneration: Secondary | ICD-10-CM

## 2023-01-10 DIAGNOSIS — E118 Type 2 diabetes mellitus with unspecified complications: Secondary | ICD-10-CM

## 2023-01-10 DIAGNOSIS — E1129 Type 2 diabetes mellitus with other diabetic kidney complication: Secondary | ICD-10-CM | POA: Diagnosis not present

## 2023-01-10 LAB — POCT GLYCOSYLATED HEMOGLOBIN (HGB A1C): Hemoglobin A1C: 6.5 % — AB (ref 4.0–5.6)

## 2023-01-10 NOTE — Assessment & Plan Note (Signed)
Continue Trulicity 4.5mg  , she is doing great off the meformin.  See log.

## 2023-01-10 NOTE — Assessment & Plan Note (Signed)
Restart injections this month.  And get back in for routine eye exam as well.

## 2023-01-10 NOTE — Assessment & Plan Note (Signed)
Continue an ACEi.

## 2023-01-10 NOTE — Progress Notes (Addendum)
Established Patient Office Visit  Subjective   Patient ID: Diana Parsons, female    DOB: 09-14-1934  Age: 89 y.o. MRN: [id number]  Chief Complaint  Patient presents with   Diabetes    HPI Diabetes - no hypoglycemic events. No wounds or sores that are not healing well. No increased thirst or urination. Checking glucose at home. Taking medications as prescribed without any side effects.  Bring in her home log today.  She did want to let me know that she was leaning forward and actually stepped on the edge of her nightgown and that caused her to fall forward initially and then backwards.  She did hit the back of her head.  No loss of consciousness, no headaches, no dizziness afterwards.  She says to the area on the back of her scalp was a little bit tender and on her neck.  But it is gradually getting better.  She was also diagnosed with COVID in February.  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  She said she even lost her taste and smell but is actually feeling much better she feels like the symptoms were fairly mild.  Also cut her finger [place] cutting a potato and had to go to urgent care.  The wound is healing well she is continuing to keep it covered with a Band-Aid.  For her macular degeneration she was supposed to get an injection when she had COVID in February but had to put it off and so she is scheduled for next week.  She has had some change in vision though says she has scheduled a regular eye exam as well.    ROS    Objective:     BP (!) 136/46   Pulse 99   Ht 5\' 6"  (1.676 m)   Wt 224 lb (101.6 kg)   SpO2 96%   BMI 36.15 kg/m    Physical Exam Vitals and nursing note reviewed.  Constitutional:      Appearance: She is well-developed.  HENT:     Head: Normocephalic and atraumatic.  Cardiovascular:     Rate and Rhythm: Normal rate and regular rhythm.      Heart sounds: Normal heart sounds.  Pulmonary:     Effort: Pulmonary effort is normal.     Breath sounds: Normal breath sounds.  Musculoskeletal:     Comments: Mild tenderness on the back f the scalp at the base of the skull.   Skin:    General: Skin is warm and dry.  Neurological:     Mental Status: She is alert and oriented to person, place, and time.  Psychiatric:        Behavior: Behavior normal.      Results for orders placed or performed in visit on 01/10/23  POCT glycosylated hemoglobin (Hb A1C)  Result Value Ref Range   Hemoglobin A1C 6.5 (A) 4.0 - 5.6 %   HbA1c POC (<> result, manual entry)     HbA1c, POC (prediabetic range)     HbA1c, POC (controlled diabetic range)        The ASCVD Risk score (Arnett DK, et al., 2019) failed to calculate for the following reasons:   The 2019 ASCVD risk score is only valid for ages 88 to 81    Assessment & Plan:   Problem List Items Addressed This Visit  Endocrine   Microalbuminuria due to type 2 diabetes mellitus    Continue an ACEi.        Controlled diabetes mellitus type 2 with complications - Primary    Continue Trulicity 4.5mg  , she is doing great off the meformin.  See log.        Relevant Orders   POCT glycosylated hemoglobin (Hb A1C) (Completed)   COMPLETE METABOLIC PANEL WITH GFR   Lipid Panel w/reflex Direct LDL     Other   Macular degeneration    Restart injections this month.  And get back in for routine eye exam as well.      Other Visit Diagnoses     Fall on same level from slipping, tripping or stumbling, initial encounter          Recent fall -I think just contusion on her scalp.  She seems to be doing well with no lasting effects.  Finger laceration seems to be healing well.  Encouraged her to apply Vaseline daily.   Return in about 4 months (around 05/12/2023) for Diabetes follow-up.    [name], MD

## 2023-01-14 ENCOUNTER — Telehealth: Payer: Self-pay

## 2023-01-14 ENCOUNTER — Other Ambulatory Visit: Payer: Self-pay | Admitting: Family Medicine

## 2023-01-14 DIAGNOSIS — E118 Type 2 diabetes mellitus with unspecified complications: Secondary | ICD-10-CM | POA: Diagnosis not present

## 2023-01-14 DIAGNOSIS — K209 Esophagitis, unspecified without bleeding: Secondary | ICD-10-CM

## 2023-01-14 NOTE — Telephone Encounter (Signed)
Patient is requesting medication refill on her following medication lisinopril, and famotidine to [facility], please advise, thanks.

## 2023-01-14 NOTE — Addendum Note (Signed)
Addended by: [name] D on: 01/14/2023 09:25 AM   Modules accepted: Orders

## 2023-01-15 LAB — COMPLETE METABOLIC PANEL WITH GFR
AG Ratio: 1.6 (calc) (ref 1.0–2.5)
ALT: 10 U/L (ref 6–29)
AST: 13 U/L (ref 10–35)
Albumin: 4.2 g/dL (ref 3.6–5.1)
Alkaline phosphatase (APISO): 83 U/L (ref 37–153)
BUN: 14 mg/dL (ref 7–25)
CO2: 29 mmol/L (ref 20–32)
Calcium: 9.3 mg/dL (ref 8.6–10.4)
Chloride: 103 mmol/L (ref 98–110)
Creat: 0.69 mg/dL (ref 0.60–0.95)
Globulin: 2.6 g/dL (calc) (ref 1.9–3.7)
Glucose, Bld: 122 mg/dL — ABNORMAL HIGH (ref 65–99)
Potassium: 4.8 mmol/L (ref 3.5–5.3)
Sodium: 141 mmol/L (ref 135–146)
Total Bilirubin: 0.4 mg/dL (ref 0.2–1.2)
Total Protein: 6.8 g/dL (ref 6.1–8.1)
eGFR: 84 mL/min/{1.73_m2} (ref >=60)

## 2023-01-15 LAB — LIPID PANEL W/REFLEX DIRECT LDL
Cholesterol: 111 mg/dL (ref <200)
HDL: 45 mg/dL — ABNORMAL LOW (ref >=50)
LDL Cholesterol (Calc): 45 mg/dL (calc)
Non-HDL Cholesterol (Calc): 66 mg/dL (calc) (ref <130)
Total CHOL/HDL Ratio: 2.5 (calc) (ref <5.0)
Triglycerides: 120 mg/dL (ref <150)

## 2023-01-15 NOTE — Progress Notes (Signed)
Hi Wynne, your metabolic panel looks okay.  LDL cholesterol looks great.

## 2023-01-24 DIAGNOSIS — E113293 Type 2 diabetes mellitus with mild nonproliferative diabetic retinopathy without macular edema, bilateral: Secondary | ICD-10-CM | POA: Diagnosis not present

## 2023-01-24 DIAGNOSIS — H3554 Dystrophies primarily involving the retinal pigment epithelium: Secondary | ICD-10-CM | POA: Diagnosis not present

## 2023-01-24 DIAGNOSIS — H353221 Exudative age-related macular degeneration, left eye, with active choroidal neovascularization: Secondary | ICD-10-CM | POA: Diagnosis not present

## 2023-01-24 DIAGNOSIS — H353211 Exudative age-related macular degeneration, right eye, with active choroidal neovascularization: Secondary | ICD-10-CM | POA: Diagnosis not present

## 2023-01-26 ENCOUNTER — Other Ambulatory Visit: Payer: Self-pay | Admitting: Family Medicine

## 2023-01-26 DIAGNOSIS — K209 Esophagitis, unspecified without bleeding: Secondary | ICD-10-CM

## 2023-01-30 DIAGNOSIS — E119 Type 2 diabetes mellitus without complications: Secondary | ICD-10-CM | POA: Diagnosis not present

## 2023-01-30 LAB — HM DIABETES EYE EXAM

## 2023-02-01 ENCOUNTER — Encounter: Payer: Self-pay | Admitting: Family Medicine

## 2023-02-06 ENCOUNTER — Telehealth: Payer: Self-pay

## 2023-02-06 ENCOUNTER — Other Ambulatory Visit: Payer: Medicare Other | Admitting: Pharmacist

## 2023-02-06 NOTE — Telephone Encounter (Signed)
Called [facility] to check status of medication, followed automated prompt to refill rx, confirmed that rx will be shipping to pt's home address.

## 2023-02-06 NOTE — Progress Notes (Signed)
Care Coordination Call  Accepted incoming call from patient regarding concern of her ongoing trulicity patient assistance shipment. Normally, she is sent medication supply in 4 month increments, last shipped 09/14/22 but was sent in 2 month chunks x2.  She states the RX number on the box is: [id number]   We did confirm that 2024 application was processed and approved.   She is set to run out of her last pen this Saturday, 02/09/23.  Will forward to RX med assistance team for contacting [employer] to troubleshoot.   Will follow for updates and contact patient for ongoing medication access support.  Lynnda Shields, PharmD, BCPS Clinical Pharmacist [facility]

## 2023-02-06 NOTE — Telephone Encounter (Signed)
-----   Message from Gabriel Carina, [facility] sent at 02/06/2023 10:45 AM EDT ----- Can you contact Diana Parsons regarding patient states her medication supply of ongoing trulicity 4.5mg  weekly is going to run out? 2024 application was submitted through PCP and accepted/going well. Troubleshooting either a shipment delay or unsure? Thanks! Thurston Hole

## 2023-02-08 ENCOUNTER — Other Ambulatory Visit: Payer: Self-pay | Admitting: Family Medicine

## 2023-02-08 DIAGNOSIS — E785 Hyperlipidemia, unspecified: Secondary | ICD-10-CM

## 2023-02-08 DIAGNOSIS — R0982 Postnasal drip: Secondary | ICD-10-CM | POA: Diagnosis not present

## 2023-02-08 DIAGNOSIS — K209 Esophagitis, unspecified without bleeding: Secondary | ICD-10-CM

## 2023-02-14 ENCOUNTER — Other Ambulatory Visit: Payer: Medicare Other | Admitting: Pharmacist

## 2023-02-14 NOTE — Progress Notes (Signed)
Care Coordination Call  Received incoming call regarding status of trulicity patient assistance. Chart documentation review shows RX med assistance team resolved RX on 02/06/23, and should ship to patients home address.   Patient is out of trulicity pens, hopeful that shipment will arrive today or Friday. Due to inject Saturday. Will outreach patient Monday to see if shipment has arrived, and make medication plans if still awaiting trulicity shipment.  Lynnda Shields, PharmD, BCPS Clinical Pharmacist [facility]

## 2023-02-18 ENCOUNTER — Other Ambulatory Visit: Payer: Medicare Other | Admitting: Pharmacist

## 2023-02-18 NOTE — Progress Notes (Signed)
Care Coordination Call  Outreached patient to assess status of trulicity shipment. Patient reports still has not yet received, she is out of medication (dose was due 02/16/23) - as it is now near the end of expected shipment window. Will outreach RX Med assistance team for contacting [employer] to investigate shipment tracking or status update.  Lynnda Shields, PharmD, BCPS Clinical Pharmacist [facility]

## 2023-02-20 ENCOUNTER — Other Ambulatory Visit: Payer: Medicare Other | Admitting: Pharmacist

## 2023-02-20 NOTE — Progress Notes (Signed)
02/20/2023 Name: Diana Parsons MRN: [id number] DOB: 1934-08-26  Chief Complaint  Patient presents with   Diabetes   Medication Assistance    Diana Parsons is a 89 y.o. year old female who presented for a telephone visit.   They were referred to the pharmacist by their PCP for assistance in managing diabetes and medication access.    Subjective:  Care Team: Primary Care Provider: Agapito Games, MD   Medication Access/Adherence  Current Pharmacy:  [facility] [place], [place] - [address] [place] [address] Phone: [phone] Fax: [phone]  [phone] [place], [place] [facility] [address] [place] [address] Phone: [phone] Fax: [phone]   Patient reports affordability concerns with their medications:  uses patient assistance for trulicity Patient reports access/transportation concerns to their pharmacy: No  Patient reports adherence concerns with their medications:  No     Diabetes:  Current medications: trulicity 4.5mg  weekly   Patient denies hypoglycemic s/sx including dizziness, shakiness, sweating. Patient denies hyperglycemic symptoms including polyuria, polydipsia, polyphagia, nocturia, neuropathy, blurred vision.   Current medication access support: trulicity via [employer]   Objective:  Lab Results  Component Value Date   HGBA1C 6.5 (A) 01/10/2023    Lab Results  Component Value Date   CREATININE 0.69 01/14/2023   BUN 14 01/14/2023   NA 141 01/14/2023   K 4.8 01/14/2023   CL 103 01/14/2023   CO2 29 01/14/2023    Lab Results  Component Value Date   CHOL 111 01/14/2023   HDL 45 (L) 01/14/2023   LDLCALC 45 01/14/2023   LDLDIRECT 104 (H) 09/14/2011   TRIG 120 01/14/2023   CHOLHDL 2.5 01/14/2023    Medications Reviewed Today     Reviewed by Gabriel Carina, RPH (Pharmacist) on 02/06/23 at 1037  Med List Status: <None>   Medication  Order Taking? Sig Documenting Provider Last Dose Status Informant  aspirin 81 MG tablet [id number]  Take 81 mg by mouth daily. [provider]  Active   bisacodyl (DULCOLAX) 5 MG EC tablet [id number]  Take 5 mg by mouth daily as needed for moderate constipation. [provider]  Active   Blood Glucose Monitoring Suppl [facility] VERIO) w/Device KIT [id number]  USE TO TEST ONCE DAILY. DX: E11.9 Agapito Games, MD  Active   CALCIUM-MAGNESIUM-VITAMIN D PO [id number]  Take 3 tablets by mouth daily. 333mg  Calcium 133mg  Magnesium 5mg  Zinc 200 IU Vitamin D3 [provider]  Active   celecoxib (CELEBREX) 200 MG capsule [id number]  Take 1 to 2 capsules by mouth daily as needed for pain. Monica Becton, MD  Active   Dulaglutide (TRULICITY) 4.5 MG/0.5ML Namon Cirri [id number] Yes Inject 4.5 mg as directed once a week. Agapito Games, MD Taking Active   famotidine (PEPCID) 20 MG tablet [id number]  Take 1 tablet (20 mg total) by mouth daily. Agapito Games, MD  Active   fish oil-omega-3 fatty acids 1000 MG capsule [id number]  Take 1 g by mouth daily. [provider]  Active   glucose blood (ONETOUCH VERIO) test strip [id number]  Dx DM E11.8. Check fasting blood sugar every morning and after largest meal of the day a couple days a week. Agapito Games, MD  Active   Lancets [facility] Larose Kells PLUS [place]) MISC [id number]  USE TO TEST TWICE A DAY Agapito Games, MD  Active   lisinopril (ZESTRIL) 5  MG tablet [id number]  Take 1 tablet (5 mg total) by mouth daily. Agapito Games, MD  Active   polyethylene glycol (MIRALAX / GLYCOLAX) 17 g packet [id number]  Take 17 g by mouth daily as needed. [provider]  Active   rosuvastatin (CRESTOR) 20 MG tablet [id number]  TAKE 1 TABLET BY MOUTH EVERY DAY Agapito Games, MD  Active              Assessment/Plan:   Diabetes: - Currently controlled, though patient has run out of trulicity  via patient assistance despite extensive coordination with the company, rx med assistance team, and prior notices of 2024 application approval. Upon resending updated RX, [employer] notified rx med assistance team that 2024 application was not processed.  - Provided patient a backup medication plan - arranged ozempic 0.5mg  weekly sample ready for pickup at PCP office. (Of note, ozempic 0.5mg  is approximately dose-equivalent to trulicity 1.5mg  weekly). This will be less, but encouraged patient this is a safe and reasonable dose for the medication change to ensure GI tolerability, and patient can monitor blood sugars during transition to watch for elevations).  - Recommend to check glucose per current routine - Meets financial criteria for ozempic 1mg  weekly patient assistance program through [employer]. Will collaborate with provider, CPhT, and patient to pursue assistance.    Follow Up Plan: 1-2 weeks  Lynnda Shields, PharmD, BCPS Clinical Pharmacist [facility]

## 2023-02-20 NOTE — Telephone Encounter (Signed)
Called and spoke to representative this time, apparently pt's assistance ended 10/14/2022 and pt is required to reenroll. Rep states that sometimes the automated system will "process" and then it won't be denied until it reaches the next stage of filling.

## 2023-02-20 NOTE — Patient Instructions (Signed)
Corrie Dandy,  Thank you for speaking with me today! I am so sorry that the company for Trulicity has given [place] such a hard time with the application and shipment of your medicine.  As discussed, we will switch over to ozempic 0.5mg  weekly, you can pick up the sample to start, and begin that dosing this Saturday in place of your trulicity.  I will also send out the online application for ozempic patient assistance, which will ship to Dr. Shelah Lewandowsky office in increments of 4 month supply each time. We can renew at the end of each year, for it to be ongoing.  Call me at [phone] if you have questions! I will reach out in 1-2 weeks time to see how you're doing on ozempic, and ask about your blood sugars.  Take care,  Elmarie Shiley, PharmD, BCPS Clinical Pharmacist [facility]

## 2023-02-21 DIAGNOSIS — E113293 Type 2 diabetes mellitus with mild nonproliferative diabetic retinopathy without macular edema, bilateral: Secondary | ICD-10-CM | POA: Diagnosis not present

## 2023-02-21 DIAGNOSIS — H353231 Exudative age-related macular degeneration, bilateral, with active choroidal neovascularization: Secondary | ICD-10-CM | POA: Diagnosis not present

## 2023-02-21 DIAGNOSIS — H3554 Dystrophies primarily involving the retinal pigment epithelium: Secondary | ICD-10-CM | POA: Diagnosis not present

## 2023-02-22 ENCOUNTER — Other Ambulatory Visit: Payer: Medicare Other | Admitting: Pharmacist

## 2023-02-22 ENCOUNTER — Telehealth: Payer: Self-pay | Admitting: Pharmacist

## 2023-02-22 NOTE — Progress Notes (Signed)
Attempted to contact patient in response to clinic-routed question about medication. Patient answered & unavailable at this time, requested callback in 1 hour. Will reattempt.  Lynnda Shields, PharmD, BCPS Clinical Pharmacist [facility]

## 2023-02-22 NOTE — Progress Notes (Signed)
Care Coordination Call  Contacted patient in response to clinic-routed request of medication questions. Patient is switching from trulicity to ozempic. Patient prefers in person appointment and reviewing injection technique.  Arranged for Tuesday, May 14 at 3pm. Will create appointment in [facility] CCM Pharmacist template in coordination with careguide.   Lynnda Shields, PharmD, BCPS Clinical Pharmacist [facility]

## 2023-02-26 ENCOUNTER — Ambulatory Visit (INDEPENDENT_AMBULATORY_CARE_PROVIDER_SITE_OTHER): Payer: Medicare Other

## 2023-02-26 DIAGNOSIS — E1129 Type 2 diabetes mellitus with other diabetic kidney complication: Secondary | ICD-10-CM | POA: Diagnosis not present

## 2023-02-26 DIAGNOSIS — R809 Proteinuria, unspecified: Secondary | ICD-10-CM

## 2023-02-26 DIAGNOSIS — E118 Type 2 diabetes mellitus with unspecified complications: Secondary | ICD-10-CM

## 2023-02-26 DIAGNOSIS — Z794 Long term (current) use of insulin: Secondary | ICD-10-CM | POA: Diagnosis not present

## 2023-02-26 NOTE — Progress Notes (Unsigned)
02/27/2023 Name: Diana Parsons MRN: [id number] DOB: [birth year]  Chief Complaint  Patient presents with   Diabetes   Medication Assistance    Diana Parsons is a 89 y.o. year old female who presented for a face to face visit.   They were referred to the pharmacist by their PCP for assistance in managing diabetes and medication access.    Subjective:  Care Team: Primary Care Provider: Agapito Games, MD   Medication Access/Adherence  Current Pharmacy:  [facility] [place], [place] - [address] [place] [address] Phone: [phone] Fax: [phone]  [phone] [place], [place] - [address] [place] [address] Phone: [phone] Fax: [phone]   Patient reports affordability concerns with their medications:  uses patient assistance for trulicity Patient reports access/transportation concerns to their pharmacy: No  Patient reports adherence concerns with their medications:  No     Diabetes:  Current medications: trulicity 4.5mg  weekly, switching to ozempic due to trulicity patient assistance not processing.   Patient denies hypoglycemic s/sx including dizziness, shakiness, sweating. Patient denies hyperglycemic symptoms including polyuria, polydipsia, polyphagia, nocturia, neuropathy, blurred vision.  Current medication access support: trulicity via [employer] --> will switch to [employer] for ozempic.   Objective:  Lab Results  Component Value Date   HGBA1C 6.5 (A) 01/10/2023    Lab Results  Component Value Date   CREATININE 0.69 01/14/2023   BUN 14 01/14/2023   NA 141 01/14/2023   K 4.8 01/14/2023   CL 103 01/14/2023   CO2 29 01/14/2023    Lab Results  Component Value Date   CHOL 111 01/14/2023   HDL 45 (L) 01/14/2023   LDLCALC 45 01/14/2023   LDLDIRECT 104 (H) 09/14/2011   TRIG 120 01/14/2023   CHOLHDL 2.5 01/14/2023    Medications  Reviewed Today     Reviewed by Gabriel Carina, RPH (Pharmacist) on 02/27/23 at 2209  Med List Status: <None>   Medication Order Taking? Sig Documenting Provider Last Dose Status Informant  aspirin 81 MG tablet [id number] No Take 81 mg by mouth daily. [provider] Taking Active   bisacodyl (DULCOLAX) 5 MG EC tablet [id number] No Take 5 mg by mouth daily as needed for moderate constipation. [provider] Taking Active   Blood Glucose Monitoring Suppl [facility] VERIO) w/Device KIT [id number] No USE TO TEST ONCE DAILY. DX: E11.9 Agapito Games, MD Taking Active   CALCIUM-MAGNESIUM-VITAMIN D PO [id number] No Take 3 tablets by mouth daily. 333mg  Calcium 133mg  Magnesium 5mg  Zinc 200 IU Vitamin D3 [provider] Taking Active   celecoxib (CELEBREX) 200 MG capsule [id number] No Take 1 to 2 capsules by mouth daily as needed for pain. Monica Becton, MD Taking Active   Dulaglutide (TRULICITY) 4.5 MG/0.5ML Namon Cirri [id number] No Inject 4.5 mg as directed once a week. Agapito Games, MD Taking Active   famotidine (PEPCID) 20 MG tablet [id number]  Take 1 tablet (20 mg total) by mouth daily. Agapito Games, MD  Active   fish oil-omega-3 fatty acids 1000 MG capsule [id number] No Take 1 g by mouth daily. [provider] Taking Active   glucose blood (ONETOUCH VERIO) test strip [id number] No Dx DM E11.8. Check fasting blood sugar every morning and after largest meal of the day a couple days a week. Agapito Games, MD Taking Active   Lancets [facility] Larose Kells PLUS [place]) [place] [id number]  No USE TO TEST TWICE A DAY Agapito Games, MD Taking Active   lisinopril (ZESTRIL) 5 MG tablet [id number] No Take 1 tablet (5 mg total) by mouth daily. Agapito Games, MD Taking Active   omeprazole (PRILOSEC) 40 MG capsule [id number]  Take 1 capsule (40 mg total) by mouth daily. Agapito Games, MD  Active   polyethylene glycol (MIRALAX / GLYCOLAX)  17 g packet [id number] No Take 17 g by mouth daily as needed. [provider] Taking Active   rosuvastatin (CRESTOR) 20 MG tablet [id number]  Take 1 tablet (20 mg total) by mouth daily. Agapito Games, MD  Active              Assessment/Plan:   Diabetes: - Currently controlled, though patient has run out of trulicity via patient assistance despite extensive coordination with the company, rx med assistance team, and prior notices of 2024 application approval. Upon resending updated RX, [employer] notified rx med assistance team that 2024 application was not processed.  - Switching to ozempic - patient presented for visit to review injection technique, differences between ozempic vs trulicty pen, and injected ozempic 0.25mg  weekly today during appointment.  - If tolerates well, will plan to increase to ozempic 0.5mg  weekly next Tuesday 03/05/23 (previously tolerated trulicity at 4.5mg  weekly).  - Recommend to check glucose per current routine - Meets financial criteria for ozempic 1mg  weekly patient assistance program through [employer]. Will collaborate with provider, CPhT, and patient to pursue assistance.    Follow Up Plan: 1 week  Lynnda Shields, PharmD, BCPS Clinical Pharmacist [facility]

## 2023-02-27 NOTE — Patient Instructions (Signed)
Sable,  It was great seeing you!! We injected ozempic 0.25mg  weekly at the office visit - I will call you next week to see how you're feeling and how blood sugars are looking. If all is going well, we will plan to increase to 0.5mg  weekly dosing.   I'll give you a phone call next week!  Take care, Elmarie Shiley, PharmD, BCPS Clinical Pharmacist [facility]

## 2023-02-28 ENCOUNTER — Other Ambulatory Visit: Payer: Self-pay | Admitting: Sports Medicine

## 2023-02-28 DIAGNOSIS — M180 Bilateral primary osteoarthritis of first carpometacarpal joints: Secondary | ICD-10-CM

## 2023-03-01 ENCOUNTER — Telehealth: Payer: Self-pay

## 2023-03-01 NOTE — Telephone Encounter (Signed)
Submitted application for OZEMPIC to [employer] for patient assistance via online portal.   Phone: [phone]  

## 2023-03-01 NOTE — Addendum Note (Signed)
Addended by: Gabriel Carina on: 03/01/2023 11:08 AM   Modules accepted: Orders

## 2023-03-01 NOTE — Telephone Encounter (Signed)
-----   Message from Gabriel Carina, [facility] sent at 02/27/2023 10:15 PM EDT ----- Can we submit online application for ozempic 1mg  weekly to [employer] please? Thank you, Thurston Hole

## 2023-03-04 ENCOUNTER — Other Ambulatory Visit: Payer: Medicare Other | Admitting: Pharmacist

## 2023-03-04 NOTE — Progress Notes (Signed)
03/04/2023 Name: Diana Parsons MRN: [id number] DOB: 04/19/1934  Chief Complaint  Patient presents with   Diabetes   Medication Assistance    ORLI TULLER is a 89 y.o. year old female who presented for a face to face visit.   They were referred to the pharmacist by their PCP for assistance in managing diabetes and medication access.    Subjective:  Care Team: Primary Care Provider: Agapito Games, MD   Medication Access/Adherence  Current Pharmacy:  [facility] [place], [place] - [address] [place] [address] Phone: [phone] Fax: [phone]  [phone] [place], [place] - [address] [place] [address] Phone: [phone] Fax: [phone]   Patient reports affordability concerns with their medications:  uses patient assistance for trulicity Patient reports access/transportation concerns to their pharmacy: No  Patient reports adherence concerns with their medications:  No     Diabetes:  Current medications: ozempic 0.25mg  weekly x1 on 02/26/23 during office visit. Patient tolerated well, no GI upset. Concerned due to BG elevations.    (Previously on trulicity 4.5mg  weekly, switching to ozempic due to trulicity patient assistance not processing.)  BG: 151, 138, 143, 155, 143, 145, 162 (ice cream after church)   Patient denies hypoglycemic s/sx including dizziness, shakiness, sweating. Patient denies hyperglycemic symptoms including polyuria, polydipsia, polyphagia, nocturia, neuropathy, blurred vision.  Current medication access support: trulicity via [employer] --> will switch to [employer] for ozempic.   Objective:  Lab Results  Component Value Date   HGBA1C 6.5 (A) 01/10/2023    Lab Results  Component Value Date   CREATININE 0.69 01/14/2023   BUN 14 01/14/2023   NA 141 01/14/2023   K 4.8 01/14/2023   CL 103 01/14/2023   CO2 29 01/14/2023     Lab Results  Component Value Date   CHOL 111 01/14/2023   HDL 45 (L) 01/14/2023   LDLCALC 45 01/14/2023   LDLDIRECT 104 (H) 09/14/2011   TRIG 120 01/14/2023   CHOLHDL 2.5 01/14/2023    Medications Reviewed Today     Reviewed by Gabriel Carina, RPH (Pharmacist) on 02/27/23 at 2209  Med List Status: <None>   Medication Order Taking? Sig Documenting Provider Last Dose Status Informant  aspirin 81 MG tablet [id number]  Take 81 mg by mouth daily. [provider]  Active   bisacodyl (DULCOLAX) 5 MG EC tablet [id number]  Take 5 mg by mouth daily as needed for moderate constipation. [provider]  Active   Blood Glucose Monitoring Suppl [facility] VERIO) w/Device KIT [id number]  USE TO TEST ONCE DAILY. DX: E11.9 Agapito Games, MD  Active   CALCIUM-MAGNESIUM-VITAMIN D PO [id number]  Take 3 tablets by mouth daily. 333mg  Calcium 133mg  Magnesium 5mg  Zinc 200 IU Vitamin D3 [provider]  Active   celecoxib (CELEBREX) 200 MG capsule [id number]  Take 1 to 2 capsules by mouth daily as needed for pain. Monica Becton, MD  Active   Dulaglutide (TRULICITY) 4.5 MG/0.5ML Namon Cirri [id number]  Inject 4.5 mg as directed once a week. Agapito Games, MD  Active   famotidine (PEPCID) 20 MG tablet [id number]  Take 1 tablet (20 mg total) by mouth daily. Agapito Games, MD  Active   fish oil-omega-3 fatty acids 1000 MG capsule [id number]  Take 1 g by mouth daily. [provider]  Active   glucose blood (ONETOUCH VERIO) test strip  [id number]  Dx DM E11.8. Check fasting blood sugar every morning and after largest meal of the day a couple days a week. Agapito Games, MD  Active   Lancets [facility] DELICA PLUS [place]) MISC [id number]  USE TO TEST TWICE A DAY Agapito Games, MD  Active   lisinopril (ZESTRIL) 5 MG tablet [id number]  Take 1 tablet (5 mg total) by mouth daily. Agapito Games, MD  Active   omeprazole (PRILOSEC) 40 MG capsule  [id number]  Take 1 capsule (40 mg total) by mouth daily. Agapito Games, MD  Active   polyethylene glycol (MIRALAX / GLYCOLAX) 17 g packet [id number]  Take 17 g by mouth daily as needed. [provider]  Active   rosuvastatin (CRESTOR) 20 MG tablet [id number]  Take 1 tablet (20 mg total) by mouth daily. Agapito Games, MD  Active              Assessment/Plan:   Diabetes: - Currently controlled, though patient has run out of trulicity via patient assistance despite extensive coordination with the company, rx med assistance team, and prior notices of 2024 application approval. Upon resending updated RX, [employer] notified rx med assistance team that 2024 application was not processed.  - Switching to ozempic - tolerated 0.25mg  ozempic x1, will increase to 0.5mg  weekly on 03/05/23 for 2 doses (faster titration due to previously tolerating trulicity 4.5mg ) - If tolerates well, will plan to increase to ozempic 1mg  weekly on Tuesday 03/19/23 - Recommend to check glucose per current routine - Meets financial criteria for ozempic 1mg  weekly patient assistance program through [employer]. Will collaborate with provider, CPhT, and patient to pursue assistance.    Follow Up Plan: ~4 weeks  Lynnda Shields, PharmD, BCPS Clinical Pharmacist [facility]

## 2023-03-04 NOTE — Patient Instructions (Signed)
Redge Gainer chatting with you today! As discussed, increase to 0.5mg  weekly ozempic tomorrow, 03/05/23.  If that goes well for 2 weeks, you can then increase to 1mg  weekly (by using two back to back injections of the 0.5mg  to equal 1mg ). You can make the increase to 1mg  weekly on June 4th, if no issues with stomach upset.  If you need to reach me before 03/07/23, call me at [phone]. After that, the scheduler for the [facility] Pharmacist team is: [phone]  Take care, Elmarie Shiley, PharmD, BCPS Clinical Pharmacist [facility]

## 2023-03-07 NOTE — Telephone Encounter (Signed)
Received notification from [employer] regarding approval for OZEMPIC. Patient assistance approved from 03/06/23 to 10/15/23.  Medication will ship to pcp's office in 10-14 business days  Phone: [phone]

## 2023-03-12 ENCOUNTER — Ambulatory Visit (INDEPENDENT_AMBULATORY_CARE_PROVIDER_SITE_OTHER): Payer: Medicare Other | Admitting: Family Medicine

## 2023-03-12 ENCOUNTER — Telehealth: Payer: Self-pay

## 2023-03-12 DIAGNOSIS — Z0000 Encounter for general adult medical examination without abnormal findings: Secondary | ICD-10-CM

## 2023-03-12 NOTE — Patient Instructions (Addendum)
MEDICARE ANNUAL WELLNESS VISIT Health Maintenance Summary and Written Plan of Care  Diana Parsons ,  Thank you for allowing me to perform your Medicare Annual Wellness Visit and for your ongoing commitment to your health.   Health Maintenance & Immunization History Health Maintenance  Topic Date Due   Zoster Vaccines- Shingrix (2 of 2) 04/12/2023 (Originally 04/17/2017)   COVID-19 Vaccine (6 - 2023-24 season) 08/27/2023 (Originally 06/15/2022)   INFLUENZA VACCINE  05/16/2023   FOOT EXAM  06/08/2023   HEMOGLOBIN A1C  07/13/2023   OPHTHALMOLOGY EXAM  01/30/2024   Medicare Annual Wellness (AWV)  03/11/2024   Colonoscopy  06/16/2026   DTaP/Tdap/Td (4 - Td or Tdap) 12/25/2032   Pneumonia Vaccine 38+ Years old  Completed   DEXA SCAN  Completed   HPV VACCINES  Aged Out   Immunization History  Administered Date(s) Administered   Fluad Quad(high Dose 65+) 07/01/2019   Influenza Split 08/14/2011   Influenza Whole 07/15/2006, 08/12/2007, 08/06/2008, 09/06/2009, 06/30/2010   Influenza, High Dose Seasonal PF 07/20/2016, 06/25/2017, 07/17/2018, 07/22/2020, 08/22/2021   Influenza, Seasonal, Injecte, Preservative Fre 09/19/2012   Influenza,inj,Quad PF,6+ Mos 08/11/2013, 06/18/2014, 08/24/2015   PFIZER Comirnaty(Gray Top)Covid-19 Tri-Sucrose Vaccine 05/02/2021   PFIZER(Purple Top)SARS-COV-2 Vaccination 12/17/2019, 01/13/2020, 07/22/2020, 05/02/2021, 08/22/2021   Pfizer Covid-19 Vaccine Bivalent Booster 68yrs & up 08/22/2021   Pneumococcal Conjugate-13 09/24/2014   Pneumococcal Polysaccharide-23 07/15/2006   Respiratory Syncytial Virus Vaccine,Recomb Aduvanted(Arexvy) 10/19/2022   Td 10/16/2003   Tdap 11/12/2013, 12/26/2022   Zoster Recombinat (Shingrix) 02/20/2017   Zoster, Live 10/15/2010    These are the patient goals that we discussed:  Goals Addressed   None      This is a list of Health Maintenance Items that are overdue or due now: 2nd dose of shingles vaccine   Orders/Referrals  Placed Today: No orders of the defined types were placed in this encounter.  (Contact our referral department at [phone] if you have not spoken with someone about your referral appointment within the next 5 days)    Follow-up Plan Follow-up with Agapito Games, MD as planned Schedule 2nd dose of shingles vaccine. Medicare wellness visit in one year.  Patient will access AVS on my chart.      Health Maintenance, Female Adopting a healthy lifestyle and getting preventive care are important in promoting health and wellness. Ask your health care provider about: The right schedule for you to have regular tests and exams. Things you can do on your own to prevent diseases and keep yourself healthy. What should I know about diet, weight, and exercise? Eat a healthy diet  Eat a diet that includes plenty of vegetables, fruits, low-fat dairy products, and lean protein. Do not eat a lot of foods that are high in solid fats, added sugars, or sodium. Maintain a healthy weight Body mass index (BMI) is used to identify weight problems. It estimates body fat based on height and weight. Your health care provider can help determine your BMI and help you achieve or maintain a healthy weight. Get regular exercise Get regular exercise. This is one of the most important things you can do for your health. Most adults should: Exercise for at least 150 minutes each week. The exercise should increase your heart rate and make you sweat (moderate-intensity exercise). Do strengthening exercises at least twice a week. This is in addition to the moderate-intensity exercise. Spend less time sitting. Even light physical activity can be beneficial. Watch cholesterol and blood lipids Have your blood tested for  lipids and cholesterol at 87 years of age, then have this test every 5 years. Have your cholesterol levels checked more often if: Your lipid or cholesterol levels are high. You are older than 87  years of age. You are at high risk for heart disease. What should I know about cancer screening? Depending on your health history and family history, you may need to have cancer screening at various ages. This may include screening for: Breast cancer. Cervical cancer. Colorectal cancer. Skin cancer. Lung cancer. What should I know about heart disease, diabetes, and high blood pressure? Blood pressure and heart disease High blood pressure causes heart disease and increases the risk of stroke. This is more likely to develop in people who have high blood pressure readings or are overweight. Have your blood pressure checked: Every 3-5 years if you are 3-71 years of age. Every year if you are 80 years old or older. Diabetes Have regular diabetes screenings. This checks your fasting blood sugar level. Have the screening done: Once every three years after age 68 if you are at a normal weight and have a low risk for diabetes. More often and at a younger age if you are overweight or have a high risk for diabetes. What should I know about preventing infection? Hepatitis B If you have a higher risk for hepatitis B, you should be screened for this virus. Talk with your health care provider to find out if you are at risk for hepatitis B infection. Hepatitis C Testing is recommended for: Everyone born from [birth year] through 1965. Anyone with known risk factors for hepatitis C. Sexually transmitted infections (STIs) Get screened for STIs, including gonorrhea and chlamydia, if: You are sexually active and are younger than 87 years of age. You are older than 87 years of age and your health care provider tells you that you are at risk for this type of infection. Your sexual activity has changed since you were last screened, and you are at increased risk for chlamydia or gonorrhea. Ask your health care provider if you are at risk. Ask your health care provider about whether you are at high risk for HIV. Your  health care provider may recommend a prescription medicine to help prevent HIV infection. If you choose to take medicine to prevent HIV, you should first get tested for HIV. You should then be tested every 3 months for as long as you are taking the medicine. Pregnancy If you are about to stop having your period (premenopausal) and you may become pregnant, seek counseling before you get pregnant. Take 400 to 800 micrograms (mcg) of folic acid every day if you become pregnant. Ask for birth control (contraception) if you want to prevent pregnancy. Osteoporosis and menopause Osteoporosis is a disease in which the bones lose minerals and strength with aging. This can result in bone fractures. If you are 28 years old or older, or if you are at risk for osteoporosis and fractures, ask your health care provider if you should: Be screened for bone loss. Take a calcium or vitamin D supplement to lower your risk of fractures. Be given hormone replacement therapy (HRT) to treat symptoms of menopause. Follow these instructions at home: Alcohol use Do not drink alcohol if: Your health care provider tells you not to drink. You are pregnant, may be pregnant, or are planning to become pregnant. If you drink alcohol: Limit how much you have to: 0-1 drink a day. Know how much alcohol is in your drink. In  the [place] one drink equals one 12 oz bottle of beer (355 mL), one 5 oz glass of wine (148 mL), or one 1 oz glass of hard liquor (44 mL). Lifestyle Do not use any products that contain nicotine or tobacco. These products include cigarettes, chewing tobacco, and vaping devices, such as e-cigarettes. If you need help quitting, ask your health care provider. Do not use street drugs. Do not share needles. Ask your health care provider for help if you need support or information about quitting drugs. General instructions Schedule regular health, dental, and eye exams. Stay current with your vaccines. Tell your  health care provider if: You often feel depressed. You have ever been abused or do not feel safe at home. Summary Adopting a healthy lifestyle and getting preventive care are important in promoting health and wellness. Follow your health care provider's instructions about healthy diet, exercising, and getting tested or screened for diseases. Follow your health care provider's instructions on monitoring your cholesterol and blood pressure. This information is not intended to replace advice given to you by your health care provider. Make sure you discuss any questions you have with your health care provider. Document Revised: 02/20/2021 Document Reviewed: 02/20/2021 Elsevier Patient Education  2024 [employer].

## 2023-03-12 NOTE — Telephone Encounter (Signed)
   Telephone encounter was:  Unsuccessful.  03/12/2023 Name: HEIDA FOPPIANO MRN: [id number] DOB: [birth year]  Unsuccessful outbound call made today to assist with:  Financial Difficulties related to Financial Strain  Outreach Attempt:  1st Attempt  A HIPAA compliant voice message was left requesting a return call.  Instructed patient to call back .   Lenard Forth [facility] Guide, [facility] [phone] [address] [place], [place], [place] [address] Phone: [phone] Email: [email or web address]

## 2023-03-12 NOTE — Progress Notes (Signed)
MEDICARE ANNUAL WELLNESS VISIT  03/12/2023  Telephone Visit Disclaimer This Medicare AWV was conducted by telephone due to national recommendations for restrictions regarding the COVID-19 Pandemic (e.g. social distancing).  I verified, using two identifiers, that I am speaking with Diana Parsons or their authorized healthcare agent. I discussed the limitations, risks, security, and privacy concerns of performing an evaluation and management service by telephone and the potential availability of an in-person appointment in the future. The patient expressed understanding and agreed to proceed.  Location of Patient: Home Location of Provider (nurse):  In the office.  Subjective:    Diana Parsons is a 89 y.o. female patient of Metheney, Diana Ehlers, MD who had a Medicare Annual Wellness Visit today via telephone. Diana Parsons is Retired and lives alone. she has 1 child. she reports that she is socially active and does interact with friends/family regularly. she is moderately physically active and enjoys reading.  Patient Care Team: Agapito Games, MD as PCP - General Rachelle Hora MD (Orthopedic Surgery) Gabriel Carina, [facility] as Pharmacist (Pharmacist)     03/12/2023    3:03 PM 03/05/2022    3:25 PM 02/03/2021    3:55 PM 02/12/2014   10:43 AM  Advanced Directives  Does Patient Have a Medical Advance Directive? Yes Yes Yes Patient has advance directive, copy not in chart  Type of Advance Directive Living will Living will Healthcare Power of [place];Living will Healthcare Power of [place];Living will  Does patient want to make changes to medical advance directive? No - Patient declined No - Patient declined No - Patient declined   Copy of Healthcare Power of Attorney in Chart?   No - copy requested     Hospital Utilization Over the Past 12 Months: # of hospitalizations or ER visits: 0 # of surgeries: 0  Review of Systems    Patient reports that her overall health is worse compared  to last year.  History obtained from chart review and the patient  Patient Reported Readings (BP, Pulse, CBG, Weight, etc) none  Pain Assessment Pain : No/denies pain     Current Medications & Allergies (verified) Allergies as of 03/12/2023   No Known Allergies      Medication List        Accurate as of Mar 12, 2023  3:12 PM. If you have any questions, ask your nurse or doctor.          aspirin 81 MG tablet Take 81 mg by mouth daily.   bisacodyl 5 MG EC tablet Commonly known as: DULCOLAX Take 5 mg by mouth daily as needed for moderate constipation.   CALCIUM-MAGNESIUM-VITAMIN D PO Take 3 tablets by mouth daily. 333mg  Calcium 133mg  Magnesium 5mg  Zinc 200 IU Vitamin D3   celecoxib 200 MG capsule Commonly known as: CELEBREX Take 1 to 2 capsules by mouth daily as needed for pain.   famotidine 20 MG tablet Commonly known as: PEPCID Take 1 tablet (20 mg total) by mouth daily.   fish oil-omega-3 fatty acids 1000 MG capsule Take 1 g by mouth daily.   lisinopril 5 MG tablet Commonly known as: ZESTRIL Take 1 tablet (5 mg total) by mouth daily.   omeprazole 40 MG capsule Commonly known as: PRILOSEC Take 1 capsule (40 mg total) by mouth daily.   OneTouch Delica Plus Lancet33G Misc USE TO TEST TWICE A DAY   OneTouch Verio test strip Generic drug: glucose blood Dx DM E11.8. Check fasting blood sugar every morning  and after largest meal of the day a couple days a week.   OneTouch Verio w/Device Kit USE TO TEST ONCE DAILY. DX: E11.9   Ozempic (0.25 or 0.5 MG/DOSE) 2 MG/3ML Sopn Generic drug: Semaglutide(0.25 or 0.5MG /DOS) Inject 0.5 mg into the skin once a week. Obtaining via patient assistance   polyethylene glycol 17 g packet Commonly known as: MIRALAX / GLYCOLAX Take 17 g by mouth daily as needed.   rosuvastatin 20 MG tablet Commonly known as: CRESTOR Take 1 tablet (20 mg total) by mouth daily.        History (reviewed): Past Medical History:   Diagnosis Date   Arthritis    Cancer (HCC)    Cataract    Diabetes mellitus without complication (HCC)    GERD (gastroesophageal reflux disease)    Past Surgical History:  Procedure Laterality Date   ABDOMINAL HYSTERECTOMY  89 yrs old   EYE SURGERY  Cataracts august 2021   left shoulder sx s/p fall  10/16/1999   has pin in place   lumbarp spine surgery  07/15/2014   Dr. Alden Hipp   SPINE SURGERY     Family History  Problem Relation Age of Onset   Cancer Mother        colon   Diabetes Sister    Diabetes Brother    Cancer Brother        lung cancer   Social History   Socioeconomic History   Marital status: Legally Separated    Spouse name: Not on file   Number of children: 1   Years of education: 13   Highest education level: Some college, no degree  Occupational History    Comment: Retired  Tobacco Use   Smoking status: Former    Packs/day: 0.10    Years: 3.00    Additional pack years: 0.00    Total pack years: 0.30    Types: Cigarettes    Quit date: 04/26/1983    Years since quitting: 39.9   Smokeless tobacco: Former   Tobacco comments:    Smoked very little for a period of about 3 to 4 years.  Vaping Use   Vaping Use: Never used  Substance and Sexual Activity   Alcohol use: No   Drug use: No   Sexual activity: Never  Other Topics Concern   Not on file  Social History Narrative   Lives alone. She has a son; who lives close by. She likes to read and belongs to book club.   Social Determinants of Health   Financial Resource Strain: Medium Risk (03/12/2023)   Overall Financial Resource Strain (CARDIA)    Difficulty of Paying Living Expenses: Somewhat hard  Food Insecurity: No Food Insecurity (03/12/2023)   Hunger Vital Sign    Worried About Running Out of Food in the Last Year: Never true    Ran Out of Food in the Last Year: Never true  Transportation Needs: No Transportation Needs (03/12/2023)   PRAPARE - Administrator, Civil Service  (Medical): No    Lack of Transportation (Non-Medical): No  Physical Activity: Inactive (03/12/2023)   Exercise Vital Sign    Days of Exercise per Week: 0 days    Minutes of Exercise per Session: 0 min  Stress: No Stress Concern Present (03/12/2023)   [employer] of Occupational Health - Occupational Stress Questionnaire    Feeling of Stress : Only a little  Social Connections: Moderately Integrated (03/12/2023)   Social Connection and Isolation Panel [NHANES]  Frequency of Communication with Friends and Family: More than three times a week    Frequency of Social Gatherings with Friends and Family: Twice a week    Attends Religious Services: More than 4 times per year    Active Member of [employer] or Organizations: Yes    Attends Banker Meetings: More than 4 times per year    Marital Status: Divorced    Activities of Daily Living    03/12/2023    8:17 AM  In your present state of health, do you have any difficulty performing the following activities:  Hearing? 0  Vision? 0  Difficulty concentrating or making decisions? 0  Walking or climbing stairs? 1  Dressing or bathing? 0  Doing errands, shopping? 0  Preparing Food and eating ? N  Using the Toilet? N  In the past six months, have you accidently leaked urine? Y  Do you have problems with loss of bowel control? N  Managing your Medications? N  Managing your Finances? N  Housekeeping or managing your Housekeeping? N    Patient Education/ Literacy How often do you need to have someone help you when you read instructions, pamphlets, or other written materials from your doctor or pharmacy?: 1 - Never What is the last grade level you completed in school?: 2nd year of college  Exercise Current Exercise Habits: The patient does not participate in regular exercise at present, Exercise limited by: orthopedic condition(s)  Diet Patient reports consuming 3 meals a day and 2 snack(s) a day Patient reports that her  primary diet is: Regular Patient reports that she does have regular access to food.   Depression Screen    03/12/2023    3:04 PM 01/10/2023    3:24 PM 06/07/2022   11:01 AM 03/05/2022    3:29 PM 10/05/2021   10:38 AM 02/03/2021    3:56 PM 08/01/2020   11:25 AM  PHQ 2/9 Scores  PHQ - 2 Score 0 0 0 0 0 0 0     Fall Risk    03/12/2023    3:03 PM 03/12/2023    8:17 AM 01/10/2023    3:24 PM 06/07/2022   11:01 AM 03/02/2022   11:35 AM  Fall Risk   Falls in the past year? 1 1 1  0 0  Number falls in past yr: 0 0 0 0 0  Injury with Fall? 1 0 0 0 0  Risk for fall due to : History of fall(s);Impaired balance/gait;Impaired mobility  History of fall(s) No Fall Risks No Fall Risks  Follow up Falls evaluation completed;Falls prevention discussed;Education provided  Falls evaluation completed Falls evaluation completed Falls evaluation completed     Objective:  Jimmie Molly Cohenour seemed alert and oriented and she participated appropriately during our telephone visit.  Blood Pressure Weight BMI  BP Readings from Last 3 Encounters:  01/10/23 (!) 136/46  12/26/22 (!) 152/73  11/24/22 139/75   Wt Readings from Last 3 Encounters:  01/10/23 224 lb (101.6 kg)  10/10/22 225 lb (102.1 kg)  06/07/22 223 lb (101.2 kg)   BMI Readings from Last 1 Encounters:  01/10/23 36.15 kg/m    *Unable to obtain current vital signs, weight, and BMI due to telephone visit type  Hearing/Vision  Corrie Dandy did not seem to have difficulty with hearing/understanding during the telephone conversation Reports that she has had a formal eye exam by an eye care professional within the past year Reports that she has not had a formal hearing evaluation  within the past year *Unable to fully assess hearing and vision during telephone visit type  Cognitive Function:    03/12/2023    3:09 PM 03/05/2022    3:33 PM 02/03/2021    4:03 PM  6CIT Screen  What Year? 0 points 0 points 0 points  What month? 0 points 0 points 0 points  What  time? 0 points 0 points 0 points  Count back from 20 0 points 0 points 0 points  Months in reverse 0 points 0 points 0 points  Repeat phrase 2 points 2 points 0 points  Total Score 2 points 2 points 0 points   (Normal:0-7, Significant for Dysfunction: >8)  Normal Cognitive Function Screening: Yes   Immunization & Health Maintenance Record Immunization History  Administered Date(s) Administered   Fluad Quad(high Dose 65+) 07/01/2019   Influenza Split 08/14/2011   Influenza Whole 07/15/2006, 08/12/2007, 08/06/2008, 09/06/2009, 06/30/2010   Influenza, High Dose Seasonal PF 07/20/2016, 06/25/2017, 07/17/2018, 07/22/2020, 08/22/2021   Influenza, Seasonal, Injecte, Preservative Fre 09/19/2012   Influenza,inj,Quad PF,6+ Mos 08/11/2013, 06/18/2014, 08/24/2015   PFIZER Comirnaty(Gray Top)Covid-19 Tri-Sucrose Vaccine 05/02/2021   PFIZER(Purple Top)SARS-COV-2 Vaccination 12/17/2019, 01/13/2020, 07/22/2020, 05/02/2021, 08/22/2021   Pfizer Covid-19 Vaccine Bivalent Booster 38yrs & up 08/22/2021   Pneumococcal Conjugate-13 09/24/2014   Pneumococcal Polysaccharide-23 07/15/2006   Respiratory Syncytial Virus Vaccine,Recomb Aduvanted(Arexvy) 10/19/2022   Td 10/16/2003   Tdap 11/12/2013, 12/26/2022   Zoster Recombinat (Shingrix) 02/20/2017   Zoster, Live 10/15/2010    Health Maintenance  Topic Date Due   Zoster Vaccines- Shingrix (2 of 2) 04/12/2023 (Originally 04/17/2017)   COVID-19 Vaccine (6 - 2023-24 season) 08/27/2023 (Originally 06/15/2022)   INFLUENZA VACCINE  05/16/2023   FOOT EXAM  06/08/2023   HEMOGLOBIN A1C  07/13/2023   OPHTHALMOLOGY EXAM  01/30/2024   Medicare Annual Wellness (AWV)  03/11/2024   Colonoscopy  06/16/2026   DTaP/Tdap/Td (4 - Td or Tdap) 12/25/2032   Pneumonia Vaccine 64+ Years old  Completed   DEXA SCAN  Completed   HPV VACCINES  Aged Out       Assessment  This is a routine wellness examination for [employer].  Health Maintenance: Due or Overdue There are  no preventive care reminders to display for this patient.  Jimmie Molly Spindel does not need a referral for Community Assistance: Care Management:   no Social Work:    no Prescription Assistance:  no Nutrition/Diabetes Education:  no   Plan:  Personalized Goals  Goals Addressed               This Visit's Progress     Patient Stated (pt-stated)        03/12/2023 AWV Goal: Diabetes Management  Patient will maintain an A1C level below 7.0 Patient will not develop any diabetic foot complications Patient will not experience any hypoglycemic episodes over the next 3 months Patient will notify our office of any CBG readings outside of the provider recommended range by calling [phone] Patient will adhere to provider recommendations for diabetes management  Patient Self Management Activities take all medications as prescribed and report any negative side effects monitor and record blood sugar readings as directed adhere to a low carbohydrate diet that incorporates lean proteins, vegetables, whole grains, low glycemic fruits check feet daily noting any sores, cracks, injuries, or callous formations see PCP or podiatrist if she notices any changes in her legs, feet, or toenails Patient will visit PCP and have an A1C level checked every 3 to 6 months as  directed  have a yearly eye exam to monitor for vascular changes associated with diabetes and will request that the report be sent to her pcp.  consult with her PCP regarding any changes in her health or new or worsening symptoms        Personalized Health Maintenance & Screening Recommendations  2nd dose of shingles vaccine  Lung Cancer Screening Recommended: no (Low Dose CT Chest recommended if Age 65-80 years, 20 pack-year currently smoking OR have quit w/in past 15 years) Hepatitis C Screening recommended: no HIV Screening recommended: no  Advanced Directives: Written information was not prepared per patient's  request.  Referrals & Orders Orders Placed This Encounter  Procedures   AMB Referral to Community Care Coordinaton ([facility] Patients)    Follow-up Plan Follow-up with Agapito Games, MD as planned Schedule 2nd dose of shingles vaccine. Medicare wellness visit in one year.  Patient will access AVS on my chart.   I have personally reviewed and noted the following in the patient's chart:   Medical and social history Use of alcohol, tobacco or illicit drugs  Current medications and supplements Functional ability and status Nutritional status Physical activity Advanced directives List of other physicians Hospitalizations, surgeries, and ER visits in previous 12 months Vitals Screenings to include cognitive, depression, and falls Referrals and appointments  In addition, I have reviewed and discussed with Diana Parsons certain preventive protocols, quality metrics, and best practice recommendations. A written personalized care plan for preventive services as well as general preventive health recommendations is available and can be mailed to the patient at her request.      Modesto Charon, RN BSN  03/12/2023

## 2023-03-13 ENCOUNTER — Telehealth: Payer: Self-pay

## 2023-03-13 NOTE — Telephone Encounter (Signed)
   Telephone encounter was:  Successful.  03/13/2023 Name: Diana Parsons MRN: [id number] DOB: 1934/01/17  Diana Parsons is a 89 y.o. year old female who is a primary care patient of Metheney, Barbarann Ehlers, MD . The community resource team was consulted for assistance with Financial Difficulties related to medical expenses  Care guide performed the following interventions: Patient provided with information about care guide support team and interviewed to confirm resource needs. Patient has no needs from [place]. Patient declined any help we have to give, she is waiting for [facility] to call back about assistance with eye injections cost  Follow Up Plan:  No further follow up planned at this time. The patient has been provided with needed resources.    [facility] Guide, [facility] [phone] [address] [place], [place], [place] [address] Phone: [phone] Email: [email or web address]

## 2023-03-22 ENCOUNTER — Telehealth: Payer: Self-pay

## 2023-03-22 NOTE — Telephone Encounter (Signed)
Forwarding to [place] as an FYI.  (Tonya B)  [employer] PAP shipment for [employer] 1 mg dose (4 boxes) received this morning. Please contact the patient to come and pick up their order today. Placed in the fridge with patient identifier. Thanks in advance.   NDC: 6045-4098-11 LOT: [id number] EXP: 2025-07-14

## 2023-03-22 NOTE — Progress Notes (Signed)
   03/22/2023  Patient ID: Diana Parsons, female   DOB: [birth year], 89 y.o.   MRN: [id number]  Outreach attempt to discuss dose titration of Ozempic.  PAP supplied 1mg  was received at Dr. Shelah Lewandowsky office today.  Wanting to make sure Ms. Deuser has tolerated 0.5mg  dose prior to starting 1mg  weekly.  Left message with my direct number for patient to call me back.  I will try to contact again Monday if I do not hear back.  Lenna Gilford, PharmD, DPLA

## 2023-03-22 NOTE — Telephone Encounter (Signed)
Pt advised.

## 2023-03-25 NOTE — Telephone Encounter (Signed)
Patient came by office to get 4 boxes of Ozempic, thanks. (03/25/2023)

## 2023-03-26 ENCOUNTER — Ambulatory Visit (INDEPENDENT_AMBULATORY_CARE_PROVIDER_SITE_OTHER): Payer: Medicare Other | Admitting: Sports Medicine

## 2023-03-26 ENCOUNTER — Other Ambulatory Visit (INDEPENDENT_AMBULATORY_CARE_PROVIDER_SITE_OTHER): Payer: Medicare Other

## 2023-03-26 DIAGNOSIS — M1712 Unilateral primary osteoarthritis, left knee: Secondary | ICD-10-CM

## 2023-03-26 NOTE — Assessment & Plan Note (Signed)
This is a pleasant 89 year old female, she has known knee osteoarthritis we finished a series of Orthovisc in January of this year, left knee. She did well until recently, now having recurrence of pain. Today we did a steroid injection, if need be we can get her approved to try Orthovisc again. Return to see me as needed.

## 2023-03-26 NOTE — Progress Notes (Signed)
    Procedures performed today:    Procedure: Real-time Ultrasound Guided injection of the left knee Device: Samsung HS60  Verbal informed consent obtained.  Time-out conducted.  Noted no overlying erythema, induration, or other signs of local infection.  Skin prepped in a sterile fashion.  Local anesthesia: Topical Ethyl chloride.  With sterile technique and under real time ultrasound guidance: Mild effusion noted, 1 cc Kenalog 40, 2 cc lidocaine, 2 cc bupivacaine injected easily Completed without difficulty  Advised to call if fevers/chills, erythema, induration, drainage, or persistent bleeding.  Images permanently stored and available for review in PACS.  Impression: Technically successful ultrasound guided injection.  Independent interpretation of notes and tests performed by another provider:   None.  Brief History, Exam, Impression, and Recommendations:    Primary osteoarthritis of left knee This is a pleasant 89 year old female, she has known knee osteoarthritis we finished a series of Orthovisc in January of this year, left knee. She did well until recently, now having recurrence of pain. Today we did a steroid injection, if need be we can get her approved to try Orthovisc again. Return to see me as needed.    ____________________________________________ Ihor Austin. Benjamin Stain, M.D., ABFM., CAQSM., AME. Primary Care and Sports Medicine [facility]  Adjunct Professor of Family Medicine  [employer]  Restaurant manager, fast food

## 2023-03-27 ENCOUNTER — Telehealth: Payer: Self-pay

## 2023-03-27 NOTE — Progress Notes (Signed)
   03/27/2023  Patient ID: Diana Parsons, female   DOB: 05-27-1934, 89 y.o.   MRN: [id number]  S/O:  Patient outreach to check on tolerance/efficacy of Ozempic.  DM -Patient states she picked up Ozempic 1mg  PAP supplied medication from Dr. Shelah Lewandowsky office Monday -She was tolerating 0.5mg  weekly very well, and took first dose of 1mg  Tuesday 6/11 -States FBG this morning was 190, but she did receive a cortisone injection in the knee yesterday   A/P:  DM -Continue current regimen and regular monitoring/recording of FBG -Sees Dr. Linford Arnold next month- f/u on A1c; if needed, can increasse ozempic to 2mg . Elevated FBG likely due to recent cortisone injection   Follow-up: None scheduled at this time but can as needed per PCP  Lenna Gilford, PharmD, DPLA

## 2023-03-28 DIAGNOSIS — H35363 Drusen (degenerative) of macula, bilateral: Secondary | ICD-10-CM | POA: Diagnosis not present

## 2023-03-28 DIAGNOSIS — H353231 Exudative age-related macular degeneration, bilateral, with active choroidal neovascularization: Secondary | ICD-10-CM | POA: Diagnosis not present

## 2023-04-15 DIAGNOSIS — E119 Type 2 diabetes mellitus without complications: Secondary | ICD-10-CM | POA: Diagnosis not present

## 2023-05-13 ENCOUNTER — Encounter: Payer: Self-pay | Admitting: Family Medicine

## 2023-05-13 ENCOUNTER — Telehealth: Payer: Self-pay | Admitting: Family Medicine

## 2023-05-13 ENCOUNTER — Ambulatory Visit: Payer: Medicare Other | Admitting: Family Medicine

## 2023-05-13 VITALS — BP 133/59 | HR 94 | Ht 66.0 in | Wt 229.0 lb

## 2023-05-13 DIAGNOSIS — E7800 Pure hypercholesterolemia, unspecified: Secondary | ICD-10-CM | POA: Diagnosis not present

## 2023-05-13 DIAGNOSIS — H3530 Unspecified macular degeneration: Secondary | ICD-10-CM

## 2023-05-13 DIAGNOSIS — Z6835 Body mass index (BMI) 35.0-35.9, adult: Secondary | ICD-10-CM

## 2023-05-13 DIAGNOSIS — E118 Type 2 diabetes mellitus with unspecified complications: Secondary | ICD-10-CM | POA: Diagnosis not present

## 2023-05-13 LAB — POCT GLYCOSYLATED HEMOGLOBIN (HGB A1C): Hemoglobin A1C: 6.9 % — AB (ref 4.0–5.6)

## 2023-05-13 NOTE — Assessment & Plan Note (Signed)
Increase Ozempic to 2 mg daily she does get patient assistance and will need to get updated forms completed.

## 2023-05-13 NOTE — Telephone Encounter (Signed)
She is currently getting patient assistance better would like to bump her dose to 2 mg instead of 1 mg.  Would you be able to assist in helping [place] get updated forms to the manufacture.

## 2023-05-13 NOTE — Assessment & Plan Note (Signed)
Continue yearly eye exams.

## 2023-05-13 NOTE — Assessment & Plan Note (Signed)
Weight has been stable.  I am hoping that she might get a little closer to 200 pounds on the 2 mg Ozempic.  She is can go ahead and double up on her dose until she can get a new supply from patient assistance.

## 2023-05-13 NOTE — Assessment & Plan Note (Signed)
Previous LDL was at goal. Continue Crestor.

## 2023-05-13 NOTE — Progress Notes (Signed)
Established Patient Office Visit  Subjective   Patient ID: Diana Parsons, female    DOB: 1933-10-20  Age: 89 y.o. MRN: [id number]  Chief Complaint  Patient presents with   Diabetes    HPI  Diabetes - no hypoglycemic events. No wounds or sores that are not healing well. No increased thirst or urination. Checking glucose at home. Taking medications as prescribed without any side effects.feels like the ozempic not working as well as the 4.5mg  trulicity.  He has noticed that it seems to suppress her appetite but has not felt like her blood sugars have been regulated as much.  Did bring in home log today.  He also got a new home meter but has not started to use it yet it says that it works with a phone app but she does not have a smart phone and she said she told the insurance company that before they mailed it to her.  But she is gena try to reach out to them to see if she might be able to connect it with her computer. [place].    She is otherwise doing pretty well.     ROS    Objective:     BP (!) 133/59   Pulse 94   Ht 5\' 6"  (1.676 m)   Wt 229 lb (103.9 kg)   SpO2 98%   BMI 36.96 kg/m    Physical Exam Vitals and nursing note reviewed.  Constitutional:      Appearance: She is well-developed.  HENT:     Head: Normocephalic and atraumatic.  Cardiovascular:     Rate and Rhythm: Normal rate and regular rhythm.     Heart sounds: Normal heart sounds.  Pulmonary:     Effort: Pulmonary effort is normal.     Breath sounds: Normal breath sounds.  Skin:    General: Skin is warm and dry.  Neurological:     Mental Status: She is alert and oriented to person, place, and time.  Psychiatric:        Behavior: Behavior normal.      Results for orders placed or performed in visit on 05/13/23  POCT glycosylated hemoglobin (Hb A1C)  Result Value Ref Range   Hemoglobin A1C 6.9 (A) 4.0 - 5.6 %   HbA1c POC (<> result, manual entry)     HbA1c, POC (prediabetic range)      HbA1c, POC (controlled diabetic range)        The ASCVD Risk score (Arnett DK, et al., 2019) failed to calculate for the following reasons:   The 2019 ASCVD risk score is only valid for ages 21 to 24    Assessment & Plan:   Problem List Items Addressed This Visit       Endocrine   Controlled diabetes mellitus type 2 with complications (HCC) - Primary    Increase Ozempic to 2 mg daily she does get patient assistance and will need to get updated forms completed.      Relevant Orders   POCT glycosylated hemoglobin (Hb A1C) (Completed)     Other   Severe obesity (BMI 35.0-35.9 with comorbidity) (HCC)    Weight has been stable.  I am hoping that she might get a little closer to 200 pounds on the 2 mg Ozempic.  She is can go ahead and double up on her dose until she can get a new supply from patient assistance.      Macular degeneration    Continue yearly eye exams.  HYPERCHOLESTEROLEMIA, PURE    Previous LDL was at goal. Continue Crestor.         Return in about 3 months (around 08/13/2023) for Diabetes follow-up.    Nani Gasser, MD  I spent 30 minutes on the day of the encounter to include pre-visit record review, face-to-face time with the patient and post visit ordering of test.

## 2023-05-16 DIAGNOSIS — H353231 Exudative age-related macular degeneration, bilateral, with active choroidal neovascularization: Secondary | ICD-10-CM | POA: Diagnosis not present

## 2023-05-16 DIAGNOSIS — E113293 Type 2 diabetes mellitus with mild nonproliferative diabetic retinopathy without macular edema, bilateral: Secondary | ICD-10-CM | POA: Diagnosis not present

## 2023-05-16 DIAGNOSIS — Z961 Presence of intraocular lens: Secondary | ICD-10-CM | POA: Diagnosis not present

## 2023-05-16 DIAGNOSIS — E119 Type 2 diabetes mellitus without complications: Secondary | ICD-10-CM | POA: Diagnosis not present

## 2023-05-16 DIAGNOSIS — H35363 Drusen (degenerative) of macula, bilateral: Secondary | ICD-10-CM | POA: Diagnosis not present

## 2023-05-17 ENCOUNTER — Telehealth: Payer: Self-pay | Admitting: Sports Medicine

## 2023-05-17 ENCOUNTER — Ambulatory Visit (INDEPENDENT_AMBULATORY_CARE_PROVIDER_SITE_OTHER): Payer: Medicare Other | Admitting: Sports Medicine

## 2023-05-17 DIAGNOSIS — M1712 Unilateral primary osteoarthritis, left knee: Secondary | ICD-10-CM | POA: Diagnosis not present

## 2023-05-17 MED ORDER — PREDNISONE 50 MG PO TABS
ORAL_TABLET | ORAL | 0 refills | Status: DC
Start: 2023-05-17 — End: 2023-06-24

## 2023-05-17 MED ORDER — TRAMADOL HCL 50 MG PO TABS
50.0000 mg | ORAL_TABLET | Freq: Three times a day (TID) | ORAL | 0 refills | Status: DC | PRN
Start: 2023-05-17 — End: 2023-06-24

## 2023-05-17 NOTE — Telephone Encounter (Signed)
Rec'd signed form.   Faxed refills to [facility].

## 2023-05-17 NOTE — Progress Notes (Signed)
    Procedures performed today:    None.  Independent interpretation of notes and tests performed by another provider:   None.  Brief History, Exam, Impression, and Recommendations:    Primary osteoarthritis of left knee Pleasant 89 year old female, she did well with Orthovisc back in December to January of this year. Now having recurrence of pain, we do need to get her approved again for Orthovisc, in the meantime we will do prednisone, tramadol, topical Voltaren. Return to see me once we get approval.    ____________________________________________ Ihor Austin. Benjamin Stain, M.D., ABFM., CAQSM., AME. Primary Care and Sports Medicine [facility]  Adjunct Professor of Family Medicine  [employer] [facility] [employer]  Restaurant manager, fast food

## 2023-05-17 NOTE — Telephone Encounter (Signed)
[employer] approval please, left knee x-ray confirmed osteoarthritis, did well with Orthovisc about 8 months ago

## 2023-05-17 NOTE — Assessment & Plan Note (Signed)
Pleasant 89 year old female, she did well with Orthovisc back in December to January of this year. Now having recurrence of pain, we do need to get her approved again for Orthovisc, in the meantime we will do prednisone, tramadol, topical Voltaren. Return to see me once we get approval.

## 2023-05-22 NOTE — Telephone Encounter (Signed)
PA information submitted via [email or web address] for Orthovisc Paperwork has been printed and given to Dr. T for signatures. Once obtained, information will be faxed to [employer] at [phone]  

## 2023-05-27 NOTE — Telephone Encounter (Signed)
Patient was approved and knows how much her copay is and will start her shots Monday at 3:00

## 2023-06-03 ENCOUNTER — Telehealth: Payer: Self-pay | Admitting: Family Medicine

## 2023-06-03 ENCOUNTER — Ambulatory Visit: Payer: Medicare Other | Admitting: Sports Medicine

## 2023-06-03 ENCOUNTER — Other Ambulatory Visit (INDEPENDENT_AMBULATORY_CARE_PROVIDER_SITE_OTHER): Payer: Medicare Other

## 2023-06-03 DIAGNOSIS — M1712 Unilateral primary osteoarthritis, left knee: Secondary | ICD-10-CM

## 2023-06-03 MED ORDER — HYALURONAN 30 MG/2ML IX SOSY
30.0000 mg | PREFILLED_SYRINGE | Freq: Once | INTRA_ARTICULAR | Status: AC
Start: 2023-06-03 — End: 2023-06-03
  Administered 2023-06-03: 30 mg via INTRA_ARTICULAR

## 2023-06-03 NOTE — Progress Notes (Signed)
    Procedures performed today:    Procedure: Real-time Ultrasound Guided injection of the left knee Device: Samsung HS60  Verbal informed consent obtained.  Time-out conducted.  Noted no overlying erythema, induration, or other signs of local infection.  Skin prepped in a sterile fashion.  Local anesthesia: Topical Ethyl chloride.  With sterile technique and under real time ultrasound guidance: Trace effusion noted, 30 mg/2 mL of OrthoVisc (sodium hyaluronate) in a prefilled syringe was injected easily into the knee through a 22-gauge needle. Completed without difficulty  Advised to call if fevers/chills, erythema, induration, drainage, or persistent bleeding.  Images permanently stored and available for review in PACS.  Impression: Technically successful ultrasound guided injection.  Independent interpretation of notes and tests performed by another provider:   None.  Brief History, Exam, Impression, and Recommendations:    Primary osteoarthritis of left knee Pleasant 89 year old female, she did well with Orthovisc back in December to January 2024, now having recurrence of pain, repeat series. Orthovisc No. 1 of 4 today, return in 1 week for #2 of 4.    ____________________________________________ Ihor Austin. Benjamin Stain, M.D., ABFM., CAQSM., AME. Primary Care and Sports Medicine [facility]  Adjunct Professor of Family Medicine  [employer] [facility] [employer] [facility]  Restaurant manager, fast food

## 2023-06-03 NOTE — Telephone Encounter (Signed)
Pt came in for appt w/another provider and was going to pick up Ozempic. It is not here yet, but she wanted to make sure that it was ordered, because she has enough for 2 more weeks.

## 2023-06-03 NOTE — Telephone Encounter (Signed)
Spoke to patient and advised her that her information has been sent and we are awaiting her medication to be sent from the company.

## 2023-06-03 NOTE — Assessment & Plan Note (Signed)
Pleasant 89 year old female, she did well with Orthovisc back in December to January 2024, now having recurrence of pain, repeat series. Orthovisc No. 1 of 4 today, return in 1 week for #2 of 4.

## 2023-06-10 ENCOUNTER — Ambulatory Visit: Payer: Medicare Other | Admitting: Sports Medicine

## 2023-06-10 ENCOUNTER — Other Ambulatory Visit (INDEPENDENT_AMBULATORY_CARE_PROVIDER_SITE_OTHER): Payer: Medicare Other

## 2023-06-10 DIAGNOSIS — M1712 Unilateral primary osteoarthritis, left knee: Secondary | ICD-10-CM

## 2023-06-10 MED ORDER — HYALURONAN 30 MG/2ML IX SOSY
30.0000 mg | PREFILLED_SYRINGE | Freq: Once | INTRA_ARTICULAR | Status: AC
Start: 2023-06-10 — End: 2023-06-10
  Administered 2023-06-10: 30 mg via INTRA_ARTICULAR

## 2023-06-10 NOTE — Assessment & Plan Note (Signed)
Orthovisc 2 of 4 left knee, return in 1 week for #3 of 4 left knee

## 2023-06-10 NOTE — Addendum Note (Signed)
Addended by: Carren Rang A on: 06/10/2023 02:42 PM   Modules accepted: Orders

## 2023-06-10 NOTE — Progress Notes (Signed)
    Procedures performed today:    Procedure: Real-time Ultrasound Guided injection of the left knee Device: Samsung HS60  Verbal informed consent obtained.  Time-out conducted.  Noted no overlying erythema, induration, or other signs of local infection.  Skin prepped in a sterile fashion.  Local anesthesia: Topical Ethyl chloride.  With sterile technique and under real time ultrasound guidance: Trace effusion noted, 30 mg/2 mL of OrthoVisc (sodium hyaluronate) in a prefilled syringe was injected easily into the knee through a 22-gauge needle. Completed without difficulty  Advised to call if fevers/chills, erythema, induration, drainage, or persistent bleeding.  Images permanently stored and available for review in PACS.  Impression: Technically successful ultrasound guided injection.  Independent interpretation of notes and tests performed by another provider:   None.  Brief History, Exam, Impression, and Recommendations:    Primary osteoarthritis of left knee Orthovisc 2 of 4 left knee, return in 1 week for #3 of 4 left knee    ____________________________________________ Ihor Austin. Benjamin Stain, M.D., ABFM., CAQSM., AME. Primary Care and Sports Medicine [place] [facility]  Adjunct Professor of Family Medicine  [employer] [facility] [employer]  Restaurant manager, fast food

## 2023-06-14 NOTE — Telephone Encounter (Signed)
I have refaxed the dose increase form to Dr. Linford Arnold. For some reason the previous one wasn't sent correctly to [facility] (wasn't saved onto my computer correctly either, apologies!).  In the meantime, an order of the 1mg  dose pens processed for shipment 06/11/23 and should arrive to office in the next 10-14 business days

## 2023-06-16 DIAGNOSIS — E119 Type 2 diabetes mellitus without complications: Secondary | ICD-10-CM | POA: Diagnosis not present

## 2023-06-18 ENCOUNTER — Ambulatory Visit (INDEPENDENT_AMBULATORY_CARE_PROVIDER_SITE_OTHER): Payer: Medicare Other | Admitting: Sports Medicine

## 2023-06-18 ENCOUNTER — Other Ambulatory Visit (INDEPENDENT_AMBULATORY_CARE_PROVIDER_SITE_OTHER): Payer: Medicare Other

## 2023-06-18 ENCOUNTER — Encounter: Payer: Self-pay | Admitting: Sports Medicine

## 2023-06-18 DIAGNOSIS — M1712 Unilateral primary osteoarthritis, left knee: Secondary | ICD-10-CM | POA: Diagnosis not present

## 2023-06-18 NOTE — Progress Notes (Signed)
    Procedures performed today:    Procedure: Real-time Ultrasound Guided injection of the left knee Device: Samsung HS60  Verbal informed consent obtained.  Time-out conducted.  Noted no overlying erythema, induration, or other signs of local infection.  Skin prepped in a sterile fashion.  Local anesthesia: Topical Ethyl chloride.  With sterile technique and under real time ultrasound guidance: Trace effusion noted, 30 mg/2 mL of OrthoVisc (sodium hyaluronate) in a prefilled syringe was injected easily into the knee through a 22-gauge needle. Completed without difficulty  Advised to call if fevers/chills, erythema, induration, drainage, or persistent bleeding.  Images permanently stored and available for review in PACS.  Impression: Technically successful ultrasound guided injection.  Independent interpretation of notes and tests performed by another provider:   None.  Brief History, Exam, Impression, and Recommendations:    Primary osteoarthritis of left knee Orthovisc 3 of 4 left knee, return in 1 week for #4 of 4.    ____________________________________________ Gwen Her. Dianah Field, M.D., ABFM., CAQSM., AME. Primary Care and Sports Medicine [facility] [facility]  Adjunct Professor of [employer] [facility] [employer]  Risk manager

## 2023-06-18 NOTE — Assessment & Plan Note (Signed)
Orthovisc 3 of 4 left knee, return in 1 week for #4 of 4. 

## 2023-06-19 NOTE — Telephone Encounter (Signed)
Refaxed 2mg  dose pen increase to [facility].

## 2023-06-24 ENCOUNTER — Other Ambulatory Visit (INDEPENDENT_AMBULATORY_CARE_PROVIDER_SITE_OTHER): Payer: Medicare Other

## 2023-06-24 ENCOUNTER — Encounter: Payer: Self-pay | Admitting: Sports Medicine

## 2023-06-24 ENCOUNTER — Ambulatory Visit (INDEPENDENT_AMBULATORY_CARE_PROVIDER_SITE_OTHER): Payer: Medicare Other | Admitting: Sports Medicine

## 2023-06-24 DIAGNOSIS — M1712 Unilateral primary osteoarthritis, left knee: Secondary | ICD-10-CM

## 2023-06-24 MED ORDER — DICLOFENAC SODIUM 75 MG PO TBEC
75.0000 mg | DELAYED_RELEASE_TABLET | Freq: Two times a day (BID) | ORAL | 11 refills | Status: DC
Start: 2023-06-24 — End: 2024-05-05

## 2023-06-24 NOTE — Assessment & Plan Note (Addendum)
Orthovisc 4 of 4 left knee, overall starting to improve, switching to Voltaren per her request. See me in 6 weeks as needed.

## 2023-06-24 NOTE — Progress Notes (Signed)
    Procedures performed today:    Procedure: Real-time Ultrasound Guided injection of the left knee Device: Samsung HS60  Verbal informed consent obtained.  Time-out conducted.  Noted no overlying erythema, induration, or other signs of local infection.  Skin prepped in a sterile fashion.  Local anesthesia: Topical Ethyl chloride.  With sterile technique and under real time ultrasound guidance: Trace effusion noted, 30 mg/2 mL of OrthoVisc (sodium hyaluronate) in a prefilled syringe was injected easily into the knee through a 22-gauge needle. Completed without difficulty  Advised to call if fevers/chills, erythema, induration, drainage, or persistent bleeding.  Images permanently stored and available for review in PACS.  Impression: Technically successful ultrasound guided injection.  Independent interpretation of notes and tests performed by another provider:   None.  Brief History, Exam, Impression, and Recommendations:    Primary osteoarthritis of left knee Orthovisc 4 of 4 left knee, overall starting to improve, switching to Voltaren per her request. See me in 6 weeks as needed.    ____________________________________________ Ihor Austin. Benjamin Stain, M.D., ABFM., CAQSM., AME. Primary Care and Sports Medicine [facility] [facility]  Adjunct Professor of Family Medicine  [place] [facility] [employer] Medicine  Restaurant manager, fast food

## 2023-06-25 ENCOUNTER — Telehealth: Payer: Self-pay

## 2023-06-25 DIAGNOSIS — M1712 Unilateral primary osteoarthritis, left knee: Secondary | ICD-10-CM

## 2023-06-25 MED ORDER — HYALURONAN 30 MG/2ML IX SOSY
30.0000 mg | PREFILLED_SYRINGE | Freq: Once | INTRA_ARTICULAR | Status: AC
Start: 2023-06-25 — End: 2023-06-25
  Administered 2023-06-25: 30 mg via INTRA_ARTICULAR

## 2023-06-25 NOTE — Addendum Note (Signed)
Addended by: Carren Rang A on: 06/25/2023 04:51 PM   Modules accepted: Orders

## 2023-06-25 NOTE — Telephone Encounter (Signed)
Forwarding to [place] as an Financial planner.  [employer] PAP shipment for Ozempic 1 mg dose / 4 boxes received this morning. Please contact the patient to come and pick up their order today. Placed in the PAP fridge with patient identifier. Thanks in advance.   NDC: 8295-6213-08 LOT: [id number] EXP: 2026-02-11

## 2023-06-26 NOTE — Telephone Encounter (Signed)
Pt advised of medication being ready for p/u

## 2023-06-27 NOTE — Telephone Encounter (Signed)
Patient picked up Ozempic 1 mg dose / 4 boxes, logged into white log binder upfront, thanks.

## 2023-07-03 ENCOUNTER — Telehealth: Payer: Self-pay

## 2023-07-03 NOTE — Progress Notes (Signed)
07/03/2023  Patient ID: Diana Parsons, female   DOB: [birth year], 89 y.o.   MRN: [id number]  S/O Patient outreach to check on efficacy/tolerance of Ozempic 2mg   Diabetes Management Plan -Current medications:  Ozempic 2mg  weekly  -Patient checks FBG daily, and states it is averaging 130 -She does not endorse any instances of hypoglycemia -Ozempic dose has been increased to 2mg , but recent shipment from [employer] PAP was 1mg  pens.  Patient is currently using 2 injections of 1mg  for 2mg  weekly dose  A/P  Diabetes Management Plan -Currently controlled -Contacted [employer] to check on status of 2mg  order change form, and the form they received on 9/4 has the incorrect provider address on it.  Coordinating with medication assistance team to get this fixed, so she can receive 2mg  pens moving forward  Follow-up:  None scheduled at this time but can as recommended per PCP  Lenna Gilford, PharmD, DPLA

## 2023-07-04 NOTE — Telephone Encounter (Signed)
Rec'd fax from [facility] requesting corrected providers address.   Corrected addr & phone number and faxed to company.

## 2023-07-09 NOTE — Telephone Encounter (Signed)
Rec' approval fax from [employer] [facility] 07/05/23  Should arrive to office in 10-14 business days

## 2023-07-12 ENCOUNTER — Telehealth: Payer: Self-pay

## 2023-07-12 NOTE — Telephone Encounter (Signed)
Patient assistance  ozempic 8mg   received .  4 boxes ( 1 pen per box)   Patient informed and will pick up at her convenience.

## 2023-07-13 ENCOUNTER — Other Ambulatory Visit: Payer: Self-pay | Admitting: Family Medicine

## 2023-07-13 DIAGNOSIS — K209 Esophagitis, unspecified without bleeding: Secondary | ICD-10-CM

## 2023-07-16 DIAGNOSIS — E119 Type 2 diabetes mellitus without complications: Secondary | ICD-10-CM | POA: Diagnosis not present

## 2023-07-18 DIAGNOSIS — H353231 Exudative age-related macular degeneration, bilateral, with active choroidal neovascularization: Secondary | ICD-10-CM | POA: Diagnosis not present

## 2023-07-18 DIAGNOSIS — H35363 Drusen (degenerative) of macula, bilateral: Secondary | ICD-10-CM | POA: Diagnosis not present

## 2023-07-18 DIAGNOSIS — E113293 Type 2 diabetes mellitus with mild nonproliferative diabetic retinopathy without macular edema, bilateral: Secondary | ICD-10-CM | POA: Diagnosis not present

## 2023-08-08 ENCOUNTER — Other Ambulatory Visit: Payer: Self-pay | Admitting: Sports Medicine

## 2023-08-08 DIAGNOSIS — M180 Bilateral primary osteoarthritis of first carpometacarpal joints: Secondary | ICD-10-CM

## 2023-08-16 DIAGNOSIS — E119 Type 2 diabetes mellitus without complications: Secondary | ICD-10-CM | POA: Diagnosis not present

## 2023-08-19 ENCOUNTER — Ambulatory Visit: Payer: Medicare Other | Admitting: Family Medicine

## 2023-08-22 ENCOUNTER — Ambulatory Visit (INDEPENDENT_AMBULATORY_CARE_PROVIDER_SITE_OTHER): Payer: Medicare Other | Admitting: Family Medicine

## 2023-08-22 ENCOUNTER — Encounter: Payer: Self-pay | Admitting: Family Medicine

## 2023-08-22 VITALS — BP 127/38 | HR 91 | Ht 66.0 in | Wt 222.0 lb

## 2023-08-22 DIAGNOSIS — N644 Mastodynia: Secondary | ICD-10-CM

## 2023-08-22 DIAGNOSIS — E1129 Type 2 diabetes mellitus with other diabetic kidney complication: Secondary | ICD-10-CM | POA: Diagnosis not present

## 2023-08-22 DIAGNOSIS — R809 Proteinuria, unspecified: Secondary | ICD-10-CM | POA: Diagnosis not present

## 2023-08-22 DIAGNOSIS — E118 Type 2 diabetes mellitus with unspecified complications: Secondary | ICD-10-CM | POA: Diagnosis not present

## 2023-08-22 DIAGNOSIS — M8580 Other specified disorders of bone density and structure, unspecified site: Secondary | ICD-10-CM

## 2023-08-22 LAB — POCT GLYCOSYLATED HEMOGLOBIN (HGB A1C): Hemoglobin A1C: 6.6 % — AB (ref 4.0–5.6)

## 2023-08-22 NOTE — Progress Notes (Addendum)
Established Patient Office Visit  Subjective   Patient ID: Diana Parsons, female    DOB: 1934/06/28  Age: 89 y.o. MRN: [id number]  Chief Complaint  Patient presents with   Diabetes    HPI  Diabetes - no hypoglycemic events. No wounds or sores that are not healing well. No increased thirst or urination. Checking glucose at home. Taking medications as prescribed without any side effects.  7-day average of 138, 14-day average of 139, 30-day average of 135, and 90-day average of 139.  Also reports some soreness and tenderness in her left breast.  She had a prior abnormal mammogram several years ago where they saw what I thought was a mass she had a further workup and it was benign.  So it always feels a little bit more hard and firm there but more recently it is felt really sore and tender and even sometimes just putting her bra has been uncomfortable she says she noticed that sometimes it feels a little bit more swollen and sometimes she will get in the hot shower and that provides some relief.  No pain or swelling into the axilla.     ROS    Objective:     BP (!) 127/38   Pulse 91   Ht 5\' 6"  (1.676 m)   Wt 222 lb (100.7 kg)   SpO2 96%   BMI 35.83 kg/m    Physical Exam Vitals and nursing note reviewed.  Constitutional:      Appearance: Normal appearance.  HENT:     Head: Normocephalic and atraumatic.  Eyes:     Conjunctiva/sclera: Conjunctivae normal.  Cardiovascular:     Rate and Rhythm: Normal rate and regular rhythm.  Pulmonary:     Effort: Pulmonary effort is normal.     Breath sounds: Normal breath sounds.  Skin:    General: Skin is warm and dry.  Neurological:     Mental Status: She is alert.  Psychiatric:        Mood and Affect: Mood normal.      Results for orders placed or performed in visit on 08/22/23  POCT HgB A1C  Result Value Ref Range   Hemoglobin A1C 6.6 (A) 4.0 - 5.6 %   HbA1c POC (<> result, manual entry)     HbA1c, POC (prediabetic  range)     HbA1c, POC (controlled diabetic range)        The ASCVD Risk score (Arnett DK, et al., 2019) failed to calculate for the following reasons:   The 2019 ASCVD risk score is only valid for ages 26 to 32    Assessment & Plan:   Problem List Items Addressed This Visit       Endocrine   Microalbuminuria due to type 2 diabetes mellitus (HCC)    Conitnue daily lisinopril.  Will get updated BMP.  Check urine microalbumin today.      Relevant Orders   CMP14+EGFR   Urine Microalbumin w/creat. ratio   Controlled diabetes mellitus type 2 with complications (HCC) - Primary    A1C of 6.6 well controlled.  Looks great on the Ozempic. Has noticed the dec appetite and is down 7 lbs since last here.    Discussed strategies around eating her protein first and her vegetables and saving carbs for last.      Relevant Orders   POCT HgB A1C (Completed)   CMP14+EGFR   Urine Microalbumin w/creat. ratio     Musculoskeletal and Integument   Osteopenia  Due for repeat DEXA in May.      Other Visit Diagnoses     Pain of left breast       Relevant Orders   MM 3D DIAGNOSTIC MAMMOGRAM BILATERAL BREAST   MS US BREAST LTD UNI LEFT INC AXILLA      Pain of left breast-we did not do an exam today but I am going to go ahead and order imaging for further workup since she has a known prior abnormality on that side that was found to be benign.  I am concerned that it is now becoming bothersome and tender and sometimes swelling.  Return in about 15 weeks (around 12/05/2023) for Diabetes follow-up.    Nani Gasser, MD

## 2023-08-22 NOTE — Assessment & Plan Note (Addendum)
Conitnue daily lisinopril.  Will get updated BMP.  Check urine microalbumin today.

## 2023-08-22 NOTE — Assessment & Plan Note (Signed)
Due for repeat DEXA in May.

## 2023-08-22 NOTE — Assessment & Plan Note (Addendum)
A1C of 6.6 well controlled.  Looks great on the Ozempic. Has noticed the dec appetite and is down 7 lbs since last here.    Discussed strategies around eating her protein first and her vegetables and saving carbs for last.

## 2023-08-23 LAB — CMP14+EGFR
ALT: 13 [IU]/L (ref 0–32)
AST: 17 [IU]/L (ref 0–40)
Albumin: 4.3 g/dL (ref 3.7–4.7)
Alkaline Phosphatase: 104 [IU]/L (ref 44–121)
BUN/Creatinine Ratio: 19 (ref 12–28)
BUN: 16 mg/dL (ref 8–27)
Bilirubin Total: 0.3 mg/dL (ref 0.0–1.2)
CO2: 23 mmol/L (ref 20–29)
Calcium: 9.5 mg/dL (ref 8.7–10.3)
Chloride: 102 mmol/L (ref 96–106)
Creatinine, Ser: 0.83 mg/dL (ref 0.57–1.00)
Globulin, Total: 2.4 g/dL (ref 1.5–4.5)
Glucose: 143 mg/dL — ABNORMAL HIGH (ref 70–99)
Potassium: 4.4 mmol/L (ref 3.5–5.2)
Sodium: 140 mmol/L (ref 134–144)
Total Protein: 6.7 g/dL (ref 6.0–8.5)
eGFR: 69 mL/min/{1.73_m2} (ref >59)

## 2023-08-23 LAB — MICROALBUMIN / CREATININE URINE RATIO
Creatinine, Urine: 316 mg/dL
Microalb/Creat Ratio: 26 mg/g{creat} (ref 0–29)
Microalbumin, Urine: 83.7 ug/mL

## 2023-08-23 NOTE — Progress Notes (Signed)
Your lab work is within acceptable range and there are no concerning findings.   ?

## 2023-09-05 DIAGNOSIS — H35363 Drusen (degenerative) of macula, bilateral: Secondary | ICD-10-CM | POA: Diagnosis not present

## 2023-09-05 DIAGNOSIS — E113293 Type 2 diabetes mellitus with mild nonproliferative diabetic retinopathy without macular edema, bilateral: Secondary | ICD-10-CM | POA: Diagnosis not present

## 2023-09-05 DIAGNOSIS — H353231 Exudative age-related macular degeneration, bilateral, with active choroidal neovascularization: Secondary | ICD-10-CM | POA: Diagnosis not present

## 2023-09-15 DIAGNOSIS — E119 Type 2 diabetes mellitus without complications: Secondary | ICD-10-CM | POA: Diagnosis not present

## 2023-09-20 ENCOUNTER — Ambulatory Visit
Admission: RE | Admit: 2023-09-20 | Discharge: 2023-09-20 | Disposition: A | Discharge status: Home / Self Care | Payer: Medicare Other | Source: Ambulatory Visit | Attending: Family Medicine | Admitting: Family Medicine

## 2023-09-20 ENCOUNTER — Other Ambulatory Visit: Payer: Self-pay | Admitting: Family Medicine

## 2023-09-20 ENCOUNTER — Telehealth: Payer: Self-pay | Admitting: Family Medicine

## 2023-09-20 DIAGNOSIS — N6315 Unspecified lump in the right breast, overlapping quadrants: Secondary | ICD-10-CM | POA: Diagnosis not present

## 2023-09-20 DIAGNOSIS — N644 Mastodynia: Secondary | ICD-10-CM

## 2023-09-20 DIAGNOSIS — N6323 Unspecified lump in the left breast, lower outer quadrant: Secondary | ICD-10-CM | POA: Diagnosis not present

## 2023-09-20 DIAGNOSIS — N6310 Unspecified lump in the right breast, unspecified quadrant: Secondary | ICD-10-CM

## 2023-09-20 DIAGNOSIS — N6320 Unspecified lump in the left breast, unspecified quadrant: Secondary | ICD-10-CM

## 2023-09-20 NOTE — Telephone Encounter (Signed)
Copied from CRM [id number]. Topic: Clinical - Request for Lab/Test Order >> Sep 20, 2023  4:16 PM Orinda Kenner C wrote: Reason for CRM: Bronson Ing from [facility] [phone] ext [phone] needs a stat order for pt, she has appt Monday afternoon for Breast US for R ad L breast, and core bx for both breast. Yvette states Dr. Linford Arnold can cosign in [facility] or sign the fax.

## 2023-09-23 ENCOUNTER — Inpatient Hospital Stay
Admission: RE | Admit: 2023-09-23 | Discharge: 2023-09-23 | Discharge status: Home / Self Care | Payer: Medicare Other | Source: Ambulatory Visit | Attending: Family Medicine | Admitting: Family Medicine

## 2023-09-23 ENCOUNTER — Ambulatory Visit
Admission: RE | Admit: 2023-09-23 | Discharge: 2023-09-23 | Disposition: A | Discharge status: Home / Self Care | Payer: Medicare Other | Source: Ambulatory Visit | Attending: Family Medicine | Admitting: Family Medicine

## 2023-09-23 DIAGNOSIS — C50512 Malignant neoplasm of lower-outer quadrant of left female breast: Secondary | ICD-10-CM | POA: Diagnosis not present

## 2023-09-23 DIAGNOSIS — Z170 Estrogen receptor positive status [ER+]: Secondary | ICD-10-CM | POA: Diagnosis not present

## 2023-09-23 DIAGNOSIS — N6315 Unspecified lump in the right breast, overlapping quadrants: Secondary | ICD-10-CM | POA: Diagnosis not present

## 2023-09-23 DIAGNOSIS — Z1732 Human epidermal growth factor receptor 2 negative status: Secondary | ICD-10-CM | POA: Diagnosis not present

## 2023-09-23 DIAGNOSIS — N6321 Unspecified lump in the left breast, upper outer quadrant: Secondary | ICD-10-CM | POA: Diagnosis not present

## 2023-09-23 DIAGNOSIS — N6320 Unspecified lump in the left breast, unspecified quadrant: Secondary | ICD-10-CM

## 2023-09-23 DIAGNOSIS — C50811 Malignant neoplasm of overlapping sites of right female breast: Secondary | ICD-10-CM | POA: Diagnosis not present

## 2023-09-23 DIAGNOSIS — Z1721 Progesterone receptor positive status: Secondary | ICD-10-CM | POA: Diagnosis not present

## 2023-09-23 DIAGNOSIS — N6310 Unspecified lump in the right breast, unspecified quadrant: Secondary | ICD-10-CM

## 2023-09-23 DIAGNOSIS — N6323 Unspecified lump in the left breast, lower outer quadrant: Secondary | ICD-10-CM | POA: Diagnosis not present

## 2023-09-23 HISTORY — PX: BREAST BIOPSY: SHX20

## 2023-09-23 NOTE — Telephone Encounter (Signed)
EPIC order signed.

## 2023-09-24 LAB — SURGICAL PATHOLOGY

## 2023-09-25 ENCOUNTER — Telehealth: Payer: Self-pay | Admitting: Family Medicine

## 2023-09-25 NOTE — Telephone Encounter (Signed)
MADE A NOTE

## 2023-09-26 ENCOUNTER — Telehealth: Payer: Self-pay | Admitting: Family Medicine

## 2023-09-26 DIAGNOSIS — C50911 Malignant neoplasm of unspecified site of right female breast: Secondary | ICD-10-CM

## 2023-09-26 NOTE — Telephone Encounter (Signed)
Pt is waiting to hear bout oncologist about referral. Pt wants to make sure that you received results from breast center. Pt's number is [phone].

## 2023-09-27 ENCOUNTER — Encounter: Payer: Self-pay | Admitting: Family Medicine

## 2023-09-27 NOTE — Telephone Encounter (Signed)
Referral, clinical notes, imaging reports, pathology reports, lab reports, demographics and copies of insurance cards have been faxed to [facility] at  at [phone]. Office will contact patient to schedule referral appointment.

## 2023-09-27 NOTE — Telephone Encounter (Signed)
Refer placed this AM.  Toshs this one is Urgent. Thank you Please let pt know when info has been sent.

## 2023-09-27 NOTE — Telephone Encounter (Signed)
Patient has been notified that complete referral has been faxed and marked Urgent. Patient was told that either a scheduler from [facility] will contact her or we will call back with an appointment date.

## 2023-09-27 NOTE — Telephone Encounter (Signed)
Called pt and check on her she is in good spirts  Report shows ER, PR pos BrCa.   Encouraged her to reach out if at any point she needs any assistance or if she is not hearing from oncology in regards to the referral.

## 2023-10-03 ENCOUNTER — Telehealth: Payer: Self-pay | Admitting: Family Medicine

## 2023-10-03 DIAGNOSIS — E113293 Type 2 diabetes mellitus with mild nonproliferative diabetic retinopathy without macular edema, bilateral: Secondary | ICD-10-CM | POA: Diagnosis not present

## 2023-10-03 DIAGNOSIS — H353231 Exudative age-related macular degeneration, bilateral, with active choroidal neovascularization: Secondary | ICD-10-CM | POA: Diagnosis not present

## 2023-10-03 DIAGNOSIS — H35363 Drusen (degenerative) of macula, bilateral: Secondary | ICD-10-CM | POA: Diagnosis not present

## 2023-10-03 NOTE — Telephone Encounter (Signed)
Copied from CRM [id number]. Topic: Referral - Status >> Oct 03, 2023 10:19 AM Gaetano Hawthorne wrote: Reason for CRM: Patient is calling to inform us that Dr. Mathis Bud' office has not received the Oncology referral - Patient provided the following fax number for their office - [phone]. Please re-send the referral on behalf of the patient so they can schedule their appointment.

## 2023-10-10 ENCOUNTER — Telehealth: Payer: Self-pay

## 2023-10-10 NOTE — Telephone Encounter (Signed)
Patient came by office to inform that she has 3 weeks left of Ozempic, patient would like to get more medication when she runs out in 3 weeks, please advise, thanks.

## 2023-10-14 DIAGNOSIS — C50511 Malignant neoplasm of lower-outer quadrant of right female breast: Secondary | ICD-10-CM | POA: Diagnosis not present

## 2023-10-14 DIAGNOSIS — C50912 Malignant neoplasm of unspecified site of left female breast: Secondary | ICD-10-CM | POA: Diagnosis not present

## 2023-10-14 DIAGNOSIS — Z170 Estrogen receptor positive status [ER+]: Secondary | ICD-10-CM | POA: Diagnosis not present

## 2023-10-14 DIAGNOSIS — C50512 Malignant neoplasm of lower-outer quadrant of left female breast: Secondary | ICD-10-CM | POA: Diagnosis not present

## 2023-10-14 DIAGNOSIS — C50519 Malignant neoplasm of lower-outer quadrant of unspecified female breast: Secondary | ICD-10-CM | POA: Insufficient documentation

## 2023-10-14 DIAGNOSIS — C50911 Malignant neoplasm of unspecified site of right female breast: Secondary | ICD-10-CM | POA: Diagnosis not present

## 2023-10-16 DIAGNOSIS — E119 Type 2 diabetes mellitus without complications: Secondary | ICD-10-CM | POA: Diagnosis not present

## 2023-10-22 DIAGNOSIS — Z1330 Encounter for screening examination for mental health and behavioral disorders, unspecified: Secondary | ICD-10-CM | POA: Diagnosis not present

## 2023-10-22 DIAGNOSIS — C50911 Malignant neoplasm of unspecified site of right female breast: Secondary | ICD-10-CM | POA: Diagnosis not present

## 2023-10-22 DIAGNOSIS — C50912 Malignant neoplasm of unspecified site of left female breast: Secondary | ICD-10-CM | POA: Diagnosis not present

## 2023-10-29 NOTE — Telephone Encounter (Signed)
 Patient came into office inquiring about her Ozempic, patient will have surgery for breast cancer next week and will start injections back again after her surgery, please advise, thanks.

## 2023-10-29 NOTE — Telephone Encounter (Signed)
 Please call patient and verify that she is getting this through patient assistance.  I believe that she is but just wanted to make sure.  If yes then please lets forward to Cheryl's team.  I am assuming she is going to need a reauthorization?  Did she receive a letter from the company?

## 2023-10-30 NOTE — Progress Notes (Signed)
   10/30/2023  Patient ID: Diana Parsons, female   DOB: 27-Mar-1934, 89 y.o.   MRN: [id number]  Clinic routed request stating patient had contacted the primary care provider's office to check on her Ozempic  2 mg that she receives through the [employer] patient assistance program.  Patient is having a procedure next Tuesday, but after this procedure she will resume Ozempic  2 mg weekly.  Next week's dose will be the last dose that she has on hand of 2 mg.  She does have 0.25/0.5 pens on hand that she can use in case she does not receive additional 2 mg pens.  Patient has not yet enrolled in [employer] patient assistance for 2025, so I am submitting this application online for her.  I will check on the status in 1 week and let her know when to expect medication to arrive.  Linn Rich, PharmD, DPLA

## 2023-10-30 NOTE — Telephone Encounter (Signed)
 Please dee notes below, thanks.

## 2023-11-05 DIAGNOSIS — Z87891 Personal history of nicotine dependence: Secondary | ICD-10-CM | POA: Diagnosis not present

## 2023-11-05 DIAGNOSIS — E669 Obesity, unspecified: Secondary | ICD-10-CM | POA: Diagnosis not present

## 2023-11-05 DIAGNOSIS — Z170 Estrogen receptor positive status [ER+]: Secondary | ICD-10-CM | POA: Diagnosis not present

## 2023-11-05 DIAGNOSIS — C50912 Malignant neoplasm of unspecified site of left female breast: Secondary | ICD-10-CM | POA: Diagnosis not present

## 2023-11-05 DIAGNOSIS — Z800 Family history of malignant neoplasm of digestive organs: Secondary | ICD-10-CM | POA: Diagnosis not present

## 2023-11-05 DIAGNOSIS — Z801 Family history of malignant neoplasm of trachea, bronchus and lung: Secondary | ICD-10-CM | POA: Diagnosis not present

## 2023-11-05 DIAGNOSIS — Z8041 Family history of malignant neoplasm of ovary: Secondary | ICD-10-CM | POA: Diagnosis not present

## 2023-11-05 DIAGNOSIS — E119 Type 2 diabetes mellitus without complications: Secondary | ICD-10-CM | POA: Diagnosis not present

## 2023-11-05 DIAGNOSIS — C50911 Malignant neoplasm of unspecified site of right female breast: Secondary | ICD-10-CM | POA: Diagnosis not present

## 2023-11-05 DIAGNOSIS — D0511 Intraductal carcinoma in situ of right breast: Secondary | ICD-10-CM | POA: Diagnosis not present

## 2023-11-05 DIAGNOSIS — Z1721 Progesterone receptor positive status: Secondary | ICD-10-CM | POA: Diagnosis not present

## 2023-11-05 DIAGNOSIS — Z17411 Hormone receptor positive with human epidermal growth factor receptor 2 negative status: Secondary | ICD-10-CM | POA: Diagnosis not present

## 2023-11-05 DIAGNOSIS — D0512 Intraductal carcinoma in situ of left breast: Secondary | ICD-10-CM | POA: Diagnosis not present

## 2023-11-05 DIAGNOSIS — I10 Essential (primary) hypertension: Secondary | ICD-10-CM | POA: Diagnosis not present

## 2023-11-05 DIAGNOSIS — Z6834 Body mass index (BMI) 34.0-34.9, adult: Secondary | ICD-10-CM | POA: Diagnosis not present

## 2023-11-08 ENCOUNTER — Other Ambulatory Visit: Payer: Self-pay

## 2023-11-08 NOTE — Progress Notes (Signed)
   11/08/2023  Patient ID: Diana Parsons, female   DOB: 05-23-1934, 89 y.o.   MRN: [id number]  Subjective/objective Telephone visit to follow up on status of Ozempic 2025 patient assistance program reenrollment  Diabetes -Current medication: Ozempic 2 mg weekly -Patient has been approved for 2025 reenrollment, but medication likely will not arrive at PCP office for another 2 weeks -Patient held her Ozempic dose last Tuesday, because of procedure scheduled for this Tuesday.  Upon returning home from operation Tuesday, she took her last dose of 2 mg on hand. -She endorses fasting blood glucose of 120s even the week she did not take a dose; fasting blood glucose after her dose this week has been in the 110-120 range -Patient does not endorse any signs or symptoms of hypoglycemia  Assessment/plan  Diabetes -Patient has three 1 mg Ozempic pens at home from previous dosing regimen.  She will do two 1 mg injections weekly until the 2 mg pens arrive. -Continue daily monitoring of fasting blood glucose  Lenna Gilford, PharmD, DPLA

## 2023-11-16 DIAGNOSIS — Z7985 Long-term (current) use of injectable non-insulin antidiabetic drugs: Secondary | ICD-10-CM | POA: Diagnosis not present

## 2023-11-16 DIAGNOSIS — Z7984 Long term (current) use of oral hypoglycemic drugs: Secondary | ICD-10-CM | POA: Diagnosis not present

## 2023-11-16 DIAGNOSIS — Y828 Other medical devices associated with adverse incidents: Secondary | ICD-10-CM | POA: Diagnosis not present

## 2023-11-16 DIAGNOSIS — N611 Abscess of the breast and nipple: Secondary | ICD-10-CM | POA: Diagnosis not present

## 2023-11-16 DIAGNOSIS — E119 Type 2 diabetes mellitus without complications: Secondary | ICD-10-CM | POA: Diagnosis not present

## 2023-11-16 DIAGNOSIS — R0602 Shortness of breath: Secondary | ICD-10-CM | POA: Diagnosis not present

## 2023-11-16 DIAGNOSIS — Z79899 Other long term (current) drug therapy: Secondary | ICD-10-CM | POA: Diagnosis not present

## 2023-11-16 DIAGNOSIS — T8149XA Infection following a procedure, other surgical site, initial encounter: Secondary | ICD-10-CM | POA: Diagnosis not present

## 2023-11-26 DIAGNOSIS — Z170 Estrogen receptor positive status [ER+]: Secondary | ICD-10-CM | POA: Diagnosis not present

## 2023-11-26 DIAGNOSIS — C50511 Malignant neoplasm of lower-outer quadrant of right female breast: Secondary | ICD-10-CM | POA: Diagnosis not present

## 2023-11-26 DIAGNOSIS — M8589 Other specified disorders of bone density and structure, multiple sites: Secondary | ICD-10-CM | POA: Diagnosis not present

## 2023-11-26 DIAGNOSIS — C50512 Malignant neoplasm of lower-outer quadrant of left female breast: Secondary | ICD-10-CM | POA: Diagnosis not present

## 2023-11-28 ENCOUNTER — Telehealth: Payer: Self-pay

## 2023-11-28 ENCOUNTER — Telehealth: Payer: Self-pay | Admitting: Family Medicine

## 2023-11-28 NOTE — Progress Notes (Signed)
   11/28/2023  Patient ID: Diana Parsons, female   DOB: May 01, 1934, 89 y.o.   MRN: [id number]  Received in basket message stating that patient had come by Dr. Shelah Lewandowsky office to pick up Ozempic from [employer] PAP, but medication has not yet been delivered.  There are current delays with all PAP medication shipments from [employer], so I did reach out to the company and was able to get a free 30-day voucher for the patient to be able to fill a 1 month supply at her local pharmacy at no cost.  Contacted the patient, and she still has another week or 2 supply on hand, but she will call me if she runs out and wishes to use the voucher at that time.  [employer] states her Ozempic 2 mg should arrive at the office next week.  Lenna Gilford, PharmD, DPLA

## 2023-11-28 NOTE — Telephone Encounter (Signed)
Pt stopped in today to pick up her Ozempic(4 month supply). She stated that it has been about seven weeks and she was told that it would be here in 4 weeks. Patient is asking for you to please give her a call. Please advise

## 2023-12-05 ENCOUNTER — Ambulatory Visit (INDEPENDENT_AMBULATORY_CARE_PROVIDER_SITE_OTHER): Payer: Medicare Other | Admitting: Family Medicine

## 2023-12-05 ENCOUNTER — Encounter: Payer: Self-pay | Admitting: Family Medicine

## 2023-12-05 VITALS — BP 131/54 | HR 94 | Ht 66.0 in | Wt 219.0 lb

## 2023-12-05 DIAGNOSIS — C50511 Malignant neoplasm of lower-outer quadrant of right female breast: Secondary | ICD-10-CM | POA: Diagnosis not present

## 2023-12-05 DIAGNOSIS — C50512 Malignant neoplasm of lower-outer quadrant of left female breast: Secondary | ICD-10-CM | POA: Diagnosis not present

## 2023-12-05 DIAGNOSIS — M8580 Other specified disorders of bone density and structure, unspecified site: Secondary | ICD-10-CM

## 2023-12-05 DIAGNOSIS — E1129 Type 2 diabetes mellitus with other diabetic kidney complication: Secondary | ICD-10-CM | POA: Diagnosis not present

## 2023-12-05 DIAGNOSIS — E118 Type 2 diabetes mellitus with unspecified complications: Secondary | ICD-10-CM | POA: Diagnosis not present

## 2023-12-05 DIAGNOSIS — R809 Proteinuria, unspecified: Secondary | ICD-10-CM

## 2023-12-05 LAB — POCT GLYCOSYLATED HEMOGLOBIN (HGB A1C): Hemoglobin A1C: 6.5 % — AB (ref 4.0–5.6)

## 2023-12-05 NOTE — Assessment & Plan Note (Signed)
Continue to work on getting inadequate calcium and vitamin D we also discussed the importance of doing some resistance training to help with bone strength.  She will be starting tamoxifen soon.  Recommend repeat DEXA in May of this year.

## 2023-12-05 NOTE — Progress Notes (Signed)
Established Patient Office Visit  Subjective  Patient ID: Diana Parsons, female    DOB: 1934/07/29  Age: 89 y.o. MRN: [id number]  Chief Complaint  Patient presents with   Diabetes    HPI  Still awaiting her Ozempic shipment through PAP.  Still has 2 weeks left.  Since I last saw her she did have her bilateral lumpectomy.  She had a little bit of complication with the 1 on the left.  In fact she ended up going to the emergency department because she felt like she could not sleep and was having a lot of chest pain laying down.  They did a workup and discovered on chest CT that she actually had an abscess.  They did do a CMP and a CBC.  Serum creatinine was 0.75 and liver function was normal.  White cells were just slightly elevated around 11,000.  She has followed up with heme-onc since then and she is gena start tamoxifen in a couple of weeks.    ROS    Objective:     BP (!) 131/54   Pulse 94   Ht 5\' 6"  (1.676 m)   Wt 219 lb (99.3 kg)   SpO2 95%   BMI 35.35 kg/m    Physical Exam Vitals and nursing note reviewed.  Constitutional:      Appearance: Normal appearance.  HENT:     Head: Normocephalic and atraumatic.  Eyes:     Conjunctiva/sclera: Conjunctivae normal.  Cardiovascular:     Rate and Rhythm: Normal rate and regular rhythm.  Pulmonary:     Effort: Pulmonary effort is normal.     Breath sounds: Normal breath sounds.  Skin:    General: Skin is warm and dry.  Neurological:     Mental Status: She is alert.  Psychiatric:        Mood and Affect: Mood normal.      Results for orders placed or performed in visit on 12/05/23  POCT HgB A1C  Result Value Ref Range   Hemoglobin A1C 6.5 (A) 4.0 - 5.6 %   HbA1c POC (<> result, manual entry)     HbA1c, POC (prediabetic range)     HbA1c, POC (controlled diabetic range)        The ASCVD Risk score (Arnett DK, et al., 2019) failed to calculate for the following reasons:   The 2019 ASCVD risk score is only  valid for ages 41 to 55    Assessment & Plan:   Problem List Items Addressed This Visit       Endocrine   Microalbuminuria due to type 2 diabetes mellitus (HCC)   Continue low-dose lisinopril daily.  Plan to recheck blood pressure again.      Controlled diabetes mellitus type 2 with complications (HCC) - Primary   C looks phenomenal today at 6.5.  Continue current regimen.  90-day average on her glucometer was one 2914-day average was 137.  Hopefully she will get her patient assistance then she did have to use 1 mg for several weeks but is using 2 of the 1 mg currently.  Continue Crestor.  Continue low-dose lisinopril.      Relevant Orders   POCT HgB A1C (Completed)     Musculoskeletal and Integument   Osteopenia   Continue to work on getting inadequate calcium and vitamin D we also discussed the importance of doing some resistance training to help with bone strength.  She will be starting tamoxifen soon.  Recommend repeat DEXA  in May of this year.        Other   Malignant neoplasm of lower-outer quadrant of female breast (HCC)   Covering well from recent lumpectomies.  Starting tamoxifen in a couple of weeks.  She is already picked up the prescription.  Monitor for mood change.  She is honestly in really good spirits.       Return in about 4 months (around 04/10/2024) for Diabetes follow-up.    Nani Gasser, MD

## 2023-12-05 NOTE — Assessment & Plan Note (Signed)
Continue low-dose lisinopril daily.  Plan to recheck blood pressure again.

## 2023-12-05 NOTE — Assessment & Plan Note (Signed)
C looks phenomenal today at 6.5.  Continue current regimen.  90-day average on her glucometer was one 2914-day average was 137.  Hopefully she will get her patient assistance then she did have to use 1 mg for several weeks but is using 2 of the 1 mg currently.  Continue Crestor.  Continue low-dose lisinopril.

## 2023-12-05 NOTE — Assessment & Plan Note (Addendum)
Covering well from recent lumpectomies.  Starting tamoxifen in a couple of weeks.  She is already picked up the prescription.  Monitor for mood change.  She is honestly in really good spirits.

## 2023-12-12 DIAGNOSIS — E113293 Type 2 diabetes mellitus with mild nonproliferative diabetic retinopathy without macular edema, bilateral: Secondary | ICD-10-CM | POA: Diagnosis not present

## 2023-12-12 DIAGNOSIS — H353231 Exudative age-related macular degeneration, bilateral, with active choroidal neovascularization: Secondary | ICD-10-CM | POA: Diagnosis not present

## 2023-12-12 DIAGNOSIS — Z961 Presence of intraocular lens: Secondary | ICD-10-CM | POA: Diagnosis not present

## 2023-12-12 DIAGNOSIS — H35363 Drusen (degenerative) of macula, bilateral: Secondary | ICD-10-CM | POA: Diagnosis not present

## 2023-12-13 ENCOUNTER — Telehealth: Payer: Self-pay

## 2023-12-13 MED ORDER — SEMAGLUTIDE (2 MG/DOSE) 8 MG/3ML ~~LOC~~ SOPN: 2.0000 mg | PEN_INJECTOR | SUBCUTANEOUS | 0 refills | Status: DC

## 2023-12-13 NOTE — Progress Notes (Signed)
   12/13/2023  Patient ID: Diana Parsons, female   DOB: 06/03/1934, 89 y.o.   MRN: [id number]  Patient contacted me because she has not received Ozempic shipment from [employer] P[employer] and is down to her last dose.  Provided her with [employer] phone number, so she can get 30 day voucher information for pharmacy to process 1 month supply to.  Prescription pending for Ozempic 2mg  weekly for 1 month for PCP to sign if in agreement.  Lenna Gilford, PharmD, DPLA

## 2023-12-14 DIAGNOSIS — E119 Type 2 diabetes mellitus without complications: Secondary | ICD-10-CM | POA: Diagnosis not present

## 2023-12-19 ENCOUNTER — Telehealth: Payer: Self-pay

## 2023-12-19 NOTE — Telephone Encounter (Signed)
 Copied from CRM [id number]. Topic: Clinical - Prescription Issue >> Dec 18, 2023  3:30 PM Nila Nephew wrote: Reason for CRM: Irving Burton calling to request clinical information regarding a PA for Semaglutide, 2 MG/DOSE, 8 MG/3ML SOPN. States needs to be determined by tomorrow afternoon. Please provide call back [phone] opt [phone] - voicemail is secure.

## 2023-12-19 NOTE — Telephone Encounter (Signed)
 Hi Diana Parsons,  Looks like Semaglutide is being sent by patient assistance.  Has this changed for the patient  or should we go ahead with the PA for the medication?

## 2023-12-19 NOTE — Telephone Encounter (Signed)
 Patient states this has been resolved. No longer needing a PA -  and will be getting medication via pt assistance

## 2023-12-24 NOTE — Telephone Encounter (Signed)
 Copied from CRM [id number]. Topic: Clinical - Prescription Issue >> Dec 20, 2023  3:05 PM Izetta Dakin wrote: Reason for CRM: Megan, with [facility], states that coverage is denied for Ozempic due to not receiving Clinical information. All information is to be faxed over.

## 2023-12-24 NOTE — Telephone Encounter (Signed)
 Patient is getting the medication through PAP.

## 2024-01-01 DIAGNOSIS — E119 Type 2 diabetes mellitus without complications: Secondary | ICD-10-CM | POA: Diagnosis not present

## 2024-01-01 LAB — HM DIABETES EYE EXAM

## 2024-01-02 ENCOUNTER — Telehealth: Payer: Self-pay

## 2024-01-02 NOTE — Telephone Encounter (Signed)
 Forwarding to [place] as an Financial planner.  [employer] PAP shipment for Ozempic 2 mg dose / 4 boxes received this morning. Please contact the patient to come and pick up their order today. Placed in the PAP fridge with patient identifier. Thanks in advance.   NDC: [id number] LOT: [id number] EXP: 2026-06-14

## 2024-01-06 NOTE — Telephone Encounter (Signed)
 Pt advised.

## 2024-01-14 DIAGNOSIS — E119 Type 2 diabetes mellitus without complications: Secondary | ICD-10-CM | POA: Diagnosis not present

## 2024-01-16 ENCOUNTER — Other Ambulatory Visit (HOSPITAL_COMMUNITY): Payer: Self-pay

## 2024-01-16 ENCOUNTER — Other Ambulatory Visit: Payer: Self-pay | Admitting: Family Medicine

## 2024-01-16 ENCOUNTER — Telehealth: Payer: Self-pay

## 2024-01-16 DIAGNOSIS — E118 Type 2 diabetes mellitus with unspecified complications: Secondary | ICD-10-CM

## 2024-01-16 NOTE — Telephone Encounter (Signed)
 Pharmacy Patient Advocate Encounter  Received notification from [facility] that Prior Authorization for Ozempic 2 has been DENIED.  Full denial letter will be uploaded to the media tab. See denial reason below.     Also, pt insurance has a plan benefit exclusion for this medication.

## 2024-01-16 NOTE — Telephone Encounter (Signed)
 Okay, I think she ended up calling for a ride for patient assistance if I am not mistaken.  We can always call the patient and verify that.

## 2024-01-17 NOTE — Telephone Encounter (Signed)
 Patient assistance paperwork was sent to pt.

## 2024-02-13 DIAGNOSIS — E113293 Type 2 diabetes mellitus with mild nonproliferative diabetic retinopathy without macular edema, bilateral: Secondary | ICD-10-CM | POA: Diagnosis not present

## 2024-02-13 DIAGNOSIS — E119 Type 2 diabetes mellitus without complications: Secondary | ICD-10-CM | POA: Diagnosis not present

## 2024-02-13 DIAGNOSIS — H353231 Exudative age-related macular degeneration, bilateral, with active choroidal neovascularization: Secondary | ICD-10-CM | POA: Diagnosis not present

## 2024-02-13 DIAGNOSIS — H35363 Drusen (degenerative) of macula, bilateral: Secondary | ICD-10-CM | POA: Diagnosis not present

## 2024-02-24 DIAGNOSIS — K5903 Drug induced constipation: Secondary | ICD-10-CM | POA: Diagnosis not present

## 2024-02-24 DIAGNOSIS — C50512 Malignant neoplasm of lower-outer quadrant of left female breast: Secondary | ICD-10-CM | POA: Diagnosis not present

## 2024-02-24 DIAGNOSIS — C50511 Malignant neoplasm of lower-outer quadrant of right female breast: Secondary | ICD-10-CM | POA: Diagnosis not present

## 2024-02-24 DIAGNOSIS — M8589 Other specified disorders of bone density and structure, multiple sites: Secondary | ICD-10-CM | POA: Diagnosis not present

## 2024-02-24 DIAGNOSIS — Z170 Estrogen receptor positive status [ER+]: Secondary | ICD-10-CM | POA: Diagnosis not present

## 2024-02-24 DIAGNOSIS — Z79810 Long term (current) use of selective estrogen receptor modulators (SERMs): Secondary | ICD-10-CM | POA: Diagnosis not present

## 2024-02-27 ENCOUNTER — Other Ambulatory Visit: Payer: Self-pay | Admitting: Family Medicine

## 2024-02-27 DIAGNOSIS — K209 Esophagitis, unspecified without bleeding: Secondary | ICD-10-CM

## 2024-02-29 ENCOUNTER — Other Ambulatory Visit: Payer: Self-pay | Admitting: Family Medicine

## 2024-02-29 DIAGNOSIS — E785 Hyperlipidemia, unspecified: Secondary | ICD-10-CM

## 2024-03-13 DIAGNOSIS — M8589 Other specified disorders of bone density and structure, multiple sites: Secondary | ICD-10-CM | POA: Diagnosis not present

## 2024-03-13 LAB — HM DEXA SCAN

## 2024-03-15 DIAGNOSIS — E119 Type 2 diabetes mellitus without complications: Secondary | ICD-10-CM | POA: Diagnosis not present

## 2024-03-18 DIAGNOSIS — C50512 Malignant neoplasm of lower-outer quadrant of left female breast: Secondary | ICD-10-CM | POA: Diagnosis not present

## 2024-03-18 DIAGNOSIS — Z853 Personal history of malignant neoplasm of breast: Secondary | ICD-10-CM | POA: Diagnosis not present

## 2024-03-18 DIAGNOSIS — R92323 Mammographic fibroglandular density, bilateral breasts: Secondary | ICD-10-CM | POA: Diagnosis not present

## 2024-03-18 DIAGNOSIS — C50511 Malignant neoplasm of lower-outer quadrant of right female breast: Secondary | ICD-10-CM | POA: Diagnosis not present

## 2024-03-18 DIAGNOSIS — Z170 Estrogen receptor positive status [ER+]: Secondary | ICD-10-CM | POA: Diagnosis not present

## 2024-03-18 LAB — HM MAMMOGRAPHY

## 2024-03-19 ENCOUNTER — Ambulatory Visit: Admitting: Medical-Surgical

## 2024-03-19 VITALS — Ht 66.0 in | Wt 212.0 lb

## 2024-03-19 DIAGNOSIS — Z0000 Encounter for general adult medical examination without abnormal findings: Secondary | ICD-10-CM | POA: Diagnosis not present

## 2024-03-19 NOTE — Progress Notes (Signed)
 Subjective:   Diana Parsons is a 89 y.o. female who presents for Medicare Annual (Subsequent) preventive examination.  Visit Complete: Virtual I connected with  Diana Parsons on 03/19/24 by a audio enabled telemedicine application and verified that I am speaking with the correct person using two identifiers.  Patient Location: Home  Provider Location: Office/Clinic  I discussed the limitations of evaluation and management by telemedicine. The patient expressed understanding and agreed to proceed.  Vital Signs: Because this visit was a virtual/telehealth visit, some criteria may be missing or patient reported. Any vitals not documented were not able to be obtained and vitals that have been documented are patient reported.  Patient Medicare AWV questionnaire was completed by the patient on 03/18/2024; I have confirmed that all information answered by patient is correct and no changes since this date.  Cardiac Risk Factors include: advanced age (>48men, >79 women);diabetes mellitus;dyslipidemia;obesity (BMI >30kg/m2);smoking/ tobacco exposure     Objective:    Today's Vitals   03/19/24 1558  Weight: 212 lb (96.2 kg)  Height: 5\' 6"  (1.676 m)   Body mass index is 34.22 kg/m.     03/19/2024    4:10 PM 03/12/2023    3:03 PM 03/05/2022    3:25 PM 02/03/2021    3:55 PM 02/12/2014   10:43 AM  Advanced Directives  Does Patient Have a Medical Advance Directive? Yes Yes Yes Yes Patient has advance directive, copy not in chart  Type of Advance Directive Healthcare Power of [place];Living will Living will Living will Healthcare Power of [place];Living will Healthcare Power of [place];Living will  Does patient want to make changes to medical advance directive? No - Patient declined No - Patient declined No - Patient declined No - Patient declined   Copy of Healthcare Power of Attorney in Chart? No - copy requested   No - copy requested     Current Medications (verified) Outpatient Encounter  Medications as of 03/19/2024  Medication Sig   aspirin 81 MG tablet Take 81 mg by mouth daily.   bisacodyl (DULCOLAX) 5 MG EC tablet Take 5 mg by mouth daily as needed for moderate constipation.   CALCIUM -MAGNESIUM-VITAMIN D  PO Take 3 tablets by mouth daily. 333mg  Calcium  133mg  Magnesium 5mg  Zinc 200 IU Vitamin D3   diclofenac  (VOLTAREN ) 75 MG EC tablet Take 1 tablet (75 mg total) by mouth 2 (two) times daily.   diclofenac  Sodium (VOLTAREN ) 1 % GEL Apply 4 g topically 4 (four) times daily. To affected joint.   famotidine  (PEPCID ) 20 MG tablet Take 1 tablet (20 mg total) by mouth daily.   fish oil-omega-3 fatty acids 1000 MG capsule Take 1 g by mouth daily.   lisinopril  (ZESTRIL ) 5 MG tablet Take 1 tablet (5 mg total) by mouth daily.   omeprazole  (PRILOSEC) 40 MG capsule Take 1 capsule (40 mg total) by mouth daily.   polyethylene glycol (MIRALAX / GLYCOLAX) 17 g packet Take 17 g by mouth daily as needed.   rosuvastatin  (CRESTOR ) 20 MG tablet Take 1 tablet (20 mg total) by mouth daily.   Semaglutide , 2 MG/DOSE, 8 MG/3ML SOPN Inject 2 mg into the skin once a week.   tamoxifen (NOLVADEX) 20 MG tablet Take 20 mg by mouth daily.   No facility-administered encounter medications on file as of 03/19/2024.    Allergies (verified) Patient has no known allergies.   History: Past Medical History:  Diagnosis Date   Arthritis    Cancer (HCC)    Cataract  Diabetes mellitus without complication (HCC)    GERD (gastroesophageal reflux disease)    Past Surgical History:  Procedure Laterality Date   ABDOMINAL HYSTERECTOMY  89 yrs old   BREAST BIOPSY Right 09/23/2023   US  RT BREAST BX W LOC DEV 1ST LESION IMG BX SPEC US  GUIDE 09/23/2023 [facility]   BREAST BIOPSY Left 09/23/2023   US  LT BREAST BX W LOC DEV 1ST LESION IMG BX SPEC US  GUIDE 09/23/2023 [facility]   EYE SURGERY  Cataracts august 2021   left shoulder sx s/p fall  10/16/1999   has pin in place   lumbarp spine surgery   07/15/2014   Dr. Arvell Birchwood   SPINE SURGERY     Family History  Problem Relation Age of Onset   Cancer Mother        colon   Diabetes Sister    Diabetes Brother    Cancer Brother        lung cancer   Social History   Socioeconomic History   Marital status: Legally Separated    Spouse name: Not on file   Number of children: 1   Years of education: 13   Highest education level: Some college, no degree  Occupational History    Comment: Retired  Tobacco Use   Smoking status: Former    Current packs/day: 0.00    Average packs/day: 0.1 packs/day for 3.0 years (0.3 ttl pk-yrs)    Types: Cigarettes    Start date: 04/25/1980    Quit date: 04/26/1983    Years since quitting: 40.9   Smokeless tobacco: Former   Tobacco comments:    Smoked very little for a period of about 3 to 4 years.  Vaping Use   Vaping status: Never Used  Substance and Sexual Activity   Alcohol use: No   Drug use: No   Sexual activity: Never  Other Topics Concern   Not on file  Social History Narrative   Lives alone. She has a son; who lives close by. She likes to read and belongs to book club.   Social Drivers of Corporate investment banker Strain: Low Risk  (03/19/2024)   Overall Financial Resource Strain (CARDIA)    Difficulty of Paying Living Expenses: Not hard at all  Food Insecurity: No Food Insecurity (03/19/2024)   Hunger Vital Sign    Worried About Running Out of Food in the Last Year: Never true    Ran Out of Food in the Last Year: Never true  Transportation Needs: No Transportation Needs (03/19/2024)   PRAPARE - Administrator, Civil Service (Medical): No    Lack of Transportation (Non-Medical): No  Physical Activity: Inactive (03/19/2024)   Exercise Vital Sign    Days of Exercise per Week: 0 days    Minutes of Exercise per Session: 0 min  Stress: No Stress Concern Present (03/19/2024)   [employer] of Occupational Health - Occupational Stress Questionnaire    Feeling of Stress :  Not at all  Social Connections: Moderately Integrated (03/19/2024)   Social Connection and Isolation Panel [NHANES]    Frequency of Communication with Friends and Family: More than three times a week    Frequency of Social Gatherings with Friends and Family: More than three times a week    Attends Religious Services: More than 4 times per year    Active Member of [employer] or Organizations: Yes    Attends Banker Meetings: More than 4 times per year  Marital Status: Divorced    Tobacco Counseling Counseling given: Not Answered Tobacco comments: Smoked very little for a period of about 3 to 4 years.   Clinical Intake:  Pre-visit preparation completed: Yes  Pain : No/denies pain     BMI - recorded: 34.22 Nutritional Status: BMI > 30  Obese Nutritional Risks: None Diabetes: Yes CBG done?: Yes (121) CBG resulted in Enter/ Edit results?: No Did pt. bring in CBG monitor from home?: No  How often do you need to have someone help you when you read instructions, pamphlets, or other written materials from your doctor or pharmacy?: 1 - Never What is the last grade level you completed in school?: 14  Interpreter Needed?: No      Activities of Daily Living    03/19/2024    4:01 PM 03/18/2024    4:58 PM  In your present state of health, do you have any difficulty performing the following activities:  Hearing? 0 0  Vision? 0 0  Difficulty concentrating or making decisions? 0 0  Walking or climbing stairs? 1   Dressing or bathing? 0 0  Doing errands, shopping? 0 0  Preparing Food and eating ? N N  Using the Toilet? N N  In the past six months, have you accidently leaked urine? N N  Do you have problems with loss of bowel control? N N  Managing your Medications? N N  Managing your Finances? N N  Housekeeping or managing your Housekeeping? Diana Parsons    Patient Care Team: Cydney Draft, MD as PCP - General Linn Rich, [place] (Pharmacist) Crane, Sadrine U, MD as  Referring Physician (Hematology and Oncology) Wilbert Hand, MD as Referring Physician (Surgery) Augustina Lecher, OD (Optometry)  Indicate any recent Medical Services you may have received from other than [facility] providers in the past year (date may be approximate).     Assessment:   This is a routine wellness examination for Diana Parsons.  Hearing/Vision screen No results found.   Goals Addressed             This Visit's Progress    Patient Stated       Patient states she would like to lose more weight and have A1c under control.       Depression Screen    03/19/2024    4:08 PM 05/13/2023   10:49 AM 03/12/2023    3:04 PM 01/10/2023    3:24 PM 06/07/2022   11:01 AM 03/05/2022    3:29 PM 10/05/2021   10:38 AM  PHQ 2/9 Scores  PHQ - 2 Score 0 0 0 0 0 0 0    Fall Risk    03/19/2024    4:10 PM 03/18/2024    4:58 PM 05/13/2023   10:49 AM 03/12/2023    3:03 PM 03/12/2023    8:17 AM  Fall Risk   Falls in the past year? 0 0 0 1 1  Number falls in past yr: 0  0 0 0  Injury with Fall? 0  0 1 0  Risk for fall due to : No Fall Risks  No Fall Risks History of fall(s);Impaired balance/gait;Impaired mobility   Follow up Falls evaluation completed  Falls evaluation completed Falls evaluation completed;Falls prevention discussed;Education provided     MEDICARE RISK AT HOME: Medicare Risk at Home Any stairs in or around the home?: No Home free of loose throw rugs in walkways, pet beds, electrical cords, etc?: No Adequate lighting in your home to  reduce risk of falls?: Yes Life alert?: No Use of a cane, walker or w/c?: Yes Grab bars in the bathroom?: No Shower chair or bench in shower?: No Elevated toilet seat or a handicapped toilet?: Yes  TIMED UP AND GO:  Was the test performed?  No    Cognitive Function:        03/19/2024    4:11 PM 03/12/2023    3:09 PM 03/05/2022    3:33 PM 02/03/2021    4:03 PM  6CIT Screen  What Year? 0 points 0 points 0 points 0 points  What month? 0  points 0 points 0 points 0 points  What time? 0 points 0 points 0 points 0 points  Count back from 20 0 points 0 points 0 points 0 points  Months in reverse 0 points 0 points 0 points 0 points  Repeat phrase 0 points 2 points 2 points 0 points  Total Score 0 points 2 points 2 points 0 points    Immunizations Immunization History  Administered Date(s) Administered   Fluad Quad(high Dose 65+) 07/01/2019   Fluad Trivalent(High Dose 65+) 06/24/2023   Influenza Split 08/14/2011   Influenza Whole 07/15/2006, 08/12/2007, 08/06/2008, 09/06/2009, 06/30/2010   Influenza, High Dose Seasonal PF 07/20/2016, 06/25/2017, 07/17/2018, 07/22/2020, 08/22/2021   Influenza, Seasonal, Injecte, Preservative Fre 09/19/2012   Influenza,inj,Quad PF,6+ Mos 08/11/2013, 06/18/2014, 08/24/2015   PFIZER Comirnaty(Gray Top)Covid-19 Tri-Sucrose Vaccine 05/02/2021   PFIZER(Purple Top)SARS-COV-2 Vaccination 12/17/2019, 01/13/2020, 07/22/2020, 05/02/2021, 08/22/2021   Pfizer Covid-19 Vaccine Bivalent Booster 41yrs & up 08/22/2021   Pfizer(Comirnaty)Fall Seasonal Vaccine 12 years and older 06/24/2023   Pneumococcal Conjugate-13 09/24/2014   Pneumococcal Polysaccharide-23 07/15/2006   Respiratory Syncytial Virus Vaccine,Recomb Aduvanted(Arexvy) 10/19/2022   Td 10/16/2003   Tdap 11/12/2013, 12/26/2022   Zoster Recombinant(Shingrix ) 02/20/2017, 07/20/2022   Zoster, Live 10/15/2010    TDAP status: Up to date  Flu Vaccine status: Up to date  Pneumococcal vaccine status: Up to date  Covid-19 vaccine status: Information provided on how to obtain vaccines.   Qualifies for Shingles Vaccine? Yes   Zostavax completed Yes   Shingrix  Completed?: Yes  Screening Tests Health Maintenance  Topic Date Due   Colonoscopy  05/30/2020   COVID-19 Vaccine (7 - Pfizer risk 2024-25 season) 12/22/2023   INFLUENZA VACCINE  05/15/2024   HEMOGLOBIN A1C  06/03/2024   FOOT EXAM  08/21/2024   OPHTHALMOLOGY EXAM  12/31/2024    MAMMOGRAM  03/18/2025   Medicare Annual Wellness (AWV)  03/19/2025   DTaP/Tdap/Td (4 - Td or Tdap) 12/25/2032   Pneumonia Vaccine 27+ Years old  Completed   DEXA SCAN  Completed   Zoster Vaccines- Shingrix   Completed   HPV VACCINES  Aged Out   Meningococcal B Vaccine  Aged Out    Health Maintenance  Health Maintenance Due  Topic Date Due   Colonoscopy  05/30/2020   COVID-19 Vaccine (7 - Pfizer risk 2024-25 season) 12/22/2023    Colorectal cancer screening: No longer required.   Mammogram status: Completed 03/18/2024. Repeat every year  Bone Density status: Completed 03/13/2024. Results reflect: Bone density results: OSTEOPENIA. Repeat every 2 years.  Lung Cancer Screening: (Low Dose CT Chest recommended if Age 47-80 years, 20 pack-year currently smoking OR have quit w/in 15years.) does not qualify.   Lung Cancer Screening Referral: n/a  Additional Screening:  Hepatitis C Screening: does not qualify; Completed   Vision Screening: Recommended annual ophthalmology exams for early detection of glaucoma and other disorders of the eye. Is the patient up  to date with their annual eye exam?  Yes  Who is the provider or what is the name of the office in which the patient attends annual eye exams? Dr Francine Iron If pt is not established with a provider, would they like to be referred to a provider to establish care? N/a.   Dental Screening: Recommended annual dental exams for proper oral hygiene  Diabetic Foot Exam: Diabetic Foot Exam: Completed 08/22/2023  Community Resource Referral / Chronic Care Management: CRR required this visit?  No   CCM required this visit?  No     Plan:     I have personally reviewed and noted the following in the patient's chart:   Medical and social history Use of alcohol, tobacco or illicit drugs  Current medications and supplements including opioid prescriptions. Patient is not currently taking opioid prescriptions. Functional ability and  status Nutritional status Physical activity Advanced directives List of other physicians Hospitalizations, surgeries # 1, and ER # 1 visits in previous 12 months Vitals Screenings to include cognitive, depression, and falls Referrals and appointments  In addition, I have reviewed and discussed with patient certain preventive protocols, quality metrics, and best practice recommendations. A written personalized care plan for preventive services as well as general preventive health recommendations were provided to patient.     Aubrey Leaf, CMA   03/19/2024   After Visit Summary: (MyChart) Due to this being a telephonic visit, the after visit summary with patients personalized plan was offered to patient via MyChart   Nurse Notes:   Diana Parsons is a 89 y.o. female patient of Diana Parsons, Diana Diego, MD who had a Medicare Annual Wellness Visit today via telephone. Diana Parsons is Retired and lives alone. she has 1 child. She reports that she is socially active and does interact with friends/family regularly. She is moderately physically active and enjoys reading.

## 2024-03-19 NOTE — Patient Instructions (Signed)
 [name] , Thank you for taking time to come for your Medicare Wellness Visit. I appreciate your ongoing commitment to your health goals. Please review the following plan we discussed and let me know if I can assist you in the future.   These are the goals we discussed:  Goals       Medication Management      Patient Goals/Self-Care Activities Over the next 90 days, patient will:  Take medications as prescribed  Follow Up Plan: Telephone appointment with care management team member scheduled for: 3-6 months      Patient Stated (pt-stated)      02/03/2021 AWV Goal: Diabetes Management  Patient will maintain an A1C level below 8.0 Patient will not develop any diabetic foot complications Patient will not experience any hypoglycemic episodes over the next 3 months Patient will notify our office of any CBG readings outside of the provider recommended range by calling [phone] Patient will adhere to provider recommendations for diabetes management  Patient Self Management Activities take all medications as prescribed and report any negative side effects monitor and record blood sugar readings as directed adhere to a low carbohydrate diet that incorporates lean proteins, vegetables, whole grains, low glycemic fruits check feet daily noting any sores, cracks, injuries, or callous formations see PCP or podiatrist if she notices any changes in her legs, feet, or toenails Patient will visit PCP and have an A1C level checked every 3 to 6 months as directed  have a yearly eye exam to monitor for vascular changes associated with diabetes and will request that the report be sent to her pcp.  consult with her PCP regarding any changes in her health or new or worsening symptoms       Patient Stated (pt-stated)      03/05/2022 AWV Goal: Diabetes Management  Patient will maintain an A1C level below 7.0 Patient will not develop any diabetic foot complications Patient will not experience any  hypoglycemic episodes over the next 3 months Patient will notify our office of any CBG readings outside of the provider recommended range by calling [phone] Patient will adhere to provider recommendations for diabetes management  Patient Self Management Activities take all medications as prescribed and report any negative side effects monitor and record blood sugar readings as directed adhere to a low carbohydrate diet that incorporates lean proteins, vegetables, whole grains, low glycemic fruits check feet daily noting any sores, cracks, injuries, or callous formations see PCP or podiatrist if she notices any changes in her legs, feet, or toenails Patient will visit PCP and have an A1C level checked every 3 to 6 months as directed  have a yearly eye exam to monitor for vascular changes associated with diabetes and will request that the report be sent to her pcp.  consult with her PCP regarding any changes in her health or new or worsening symptoms       Patient Stated (pt-stated)      03/12/2023 AWV Goal: Diabetes Management  Patient will maintain an A1C level below 7.0 Patient will not develop any diabetic foot complications Patient will not experience any hypoglycemic episodes over the next 3 months Patient will notify our office of any CBG readings outside of the provider recommended range by calling [phone] Patient will adhere to provider recommendations for diabetes management  Patient Self Management Activities take all medications as prescribed and report any negative side effects monitor and record blood sugar readings as directed adhere to a low carbohydrate diet that  incorporates lean proteins, vegetables, whole grains, low glycemic fruits check feet daily noting any sores, cracks, injuries, or callous formations see PCP or podiatrist if she notices any changes in her legs, feet, or toenails Patient will visit PCP and have an A1C level checked every 3 to 6 months as  directed  have a yearly eye exam to monitor for vascular changes associated with diabetes and will request that the report be sent to her pcp.  consult with her PCP regarding any changes in her health or new or worsening symptoms       Patient Stated      Patient states she would like to lose more weight and have A1c under control.         This is a list of the screening recommended for you and due dates:  Health Maintenance  Topic Date Due   Colon Cancer Screening  05/30/2020   COVID-19 Vaccine (7 - Pfizer risk 2024-25 season) 12/22/2023   Flu Shot  05/15/2024   Hemoglobin A1C  06/03/2024   Complete foot exam   08/21/2024   Mammogram  09/19/2024   Eye exam for diabetics  12/31/2024   Medicare Annual Wellness Visit  03/19/2025   DTaP/Tdap/Td vaccine (4 - Td or Tdap) 12/25/2032   Pneumonia Vaccine  Completed   DEXA scan (bone density measurement)  Completed   Zoster (Shingles) Vaccine  Completed   HPV Vaccine  Aged Out   Meningitis B Vaccine  Aged Out

## 2024-04-06 ENCOUNTER — Ambulatory Visit (INDEPENDENT_AMBULATORY_CARE_PROVIDER_SITE_OTHER): Payer: Medicare Other | Admitting: Family Medicine

## 2024-04-06 ENCOUNTER — Encounter: Payer: Self-pay | Admitting: Family Medicine

## 2024-04-06 VITALS — BP 128/51 | HR 84 | Ht 66.0 in | Wt 214.0 lb

## 2024-04-06 DIAGNOSIS — M1712 Unilateral primary osteoarthritis, left knee: Secondary | ICD-10-CM | POA: Diagnosis not present

## 2024-04-06 DIAGNOSIS — E118 Type 2 diabetes mellitus with unspecified complications: Secondary | ICD-10-CM

## 2024-04-06 DIAGNOSIS — K5909 Other constipation: Secondary | ICD-10-CM

## 2024-04-06 LAB — POCT GLYCOSYLATED HEMOGLOBIN (HGB A1C): Hemoglobin A1C: 6.3 % — AB (ref 4.0–5.6)

## 2024-04-06 NOTE — Assessment & Plan Note (Signed)
 A1C looks gret at 6.3. continue current regimen. F/U in 4 months.  Will be due for urine microalbumin at next visit.

## 2024-04-06 NOTE — Assessment & Plan Note (Signed)
 Kircher to schedule with Dr. Curtis as she has done well with injections in the past.  She does use a cane.

## 2024-04-06 NOTE — Assessment & Plan Note (Signed)
 Has had a little bit more constipation with the Ozempic .  We discussed that she may want to consider getting on a regular regimen with MiraLAX for example taking 1 capful mixed with 6 ounces of water twice a week or even a half of a capful.  Mostly she takes fiber Gummies.

## 2024-04-06 NOTE — Progress Notes (Signed)
 Established Patient Office Visit  Subjective  Patient ID: Diana Parsons, female    DOB: May 08, 1934  Age: 89 y.o. MRN: [id number]  Chief Complaint  Patient presents with   Diabetes    HPI   Diabetes - no hypoglycemic events. No wounds or sores that are not healing well. No increased thirst or urination. Checking glucose at home. Taking medications.  She has been getting a little worsening constipation on the Ozempic .  She did bring in her glucometer.  Her 30-day average was 126 and her 90-day average was 132.  She typically sticks her finger daily.  She had had her most recent mammogram and bone density test and just wanted to make sure that we got copies of that from [place].  She has been having a little bit more pain in her left knee she is gena plan to make an appoint with Dr. Curtis to see if he could do injections again they were helpful in the past.     ROS    Objective:     BP (!) 128/51   Pulse 84   Ht 5' 6 (1.676 m)   Wt 214 lb (97.1 kg)   SpO2 94%   BMI 34.54 kg/m    Physical Exam Vitals and nursing note reviewed.  Constitutional:      Appearance: Normal appearance.  HENT:     Head: Normocephalic and atraumatic.   Eyes:     Conjunctiva/sclera: Conjunctivae normal.    Cardiovascular:     Rate and Rhythm: Normal rate and regular rhythm.  Pulmonary:     Effort: Pulmonary effort is normal.     Breath sounds: Normal breath sounds.   Skin:    General: Skin is warm and dry.   Neurological:     Mental Status: She is alert.   Psychiatric:        Mood and Affect: Mood normal.      Results for orders placed or performed in visit on 04/06/24  POCT HgB A1C  Result Value Ref Range   Hemoglobin A1C 6.3 (A) 4.0 - 5.6 %   HbA1c POC (<> result, manual entry)     HbA1c, POC (prediabetic range)     HbA1c, POC (controlled diabetic range)        The ASCVD Risk score (Arnett DK, et al., 2019) failed to calculate for the following reasons:    The 2019 ASCVD risk score is only valid for ages 63 to 45    Assessment & Plan:   Problem List Items Addressed This Visit       Digestive   Chronic constipation   Has had a little bit more constipation with the Ozempic .  We discussed that she may want to consider getting on a regular regimen with MiraLAX for example taking 1 capful mixed with 6 ounces of water twice a week or even a half of a capful.  Mostly she takes fiber Gummies.        Endocrine   Controlled diabetes mellitus type 2 with complications (HCC) - Primary   A1C looks gret at 6.3. continue current regimen. F/U in 4 months.  Will be due for urine microalbumin at next visit.      Relevant Orders   POCT HgB A1C (Completed)     Musculoskeletal and Integument   Primary osteoarthritis of left knee   Kircher to schedule with Dr. Curtis as she has done well with injections in the past.  She does use a cane.  Return in about 4 months (around 08/06/2024) for Diabetes follow-up, urine microalbumin.    Dorothyann Byars, MD

## 2024-04-09 DIAGNOSIS — H353231 Exudative age-related macular degeneration, bilateral, with active choroidal neovascularization: Secondary | ICD-10-CM | POA: Diagnosis not present

## 2024-04-09 DIAGNOSIS — H35363 Drusen (degenerative) of macula, bilateral: Secondary | ICD-10-CM | POA: Diagnosis not present

## 2024-04-10 ENCOUNTER — Telehealth: Payer: Self-pay | Admitting: Sports Medicine

## 2024-04-10 ENCOUNTER — Ambulatory Visit (INDEPENDENT_AMBULATORY_CARE_PROVIDER_SITE_OTHER): Admitting: Sports Medicine

## 2024-04-10 ENCOUNTER — Other Ambulatory Visit (INDEPENDENT_AMBULATORY_CARE_PROVIDER_SITE_OTHER)

## 2024-04-10 DIAGNOSIS — M1712 Unilateral primary osteoarthritis, left knee: Secondary | ICD-10-CM

## 2024-04-10 MED ORDER — TRIAMCINOLONE ACETONIDE 40 MG/ML IJ SUSP
40.0000 mg | Freq: Once | INTRAMUSCULAR | Status: AC
  Administered 2024-04-10: 40 mg via INTRA_ARTICULAR

## 2024-04-10 NOTE — Assessment & Plan Note (Signed)
 Pleasant 89 year old female, knee osteoarthritis, we last did Orthovisc in August 2024. With the recurrence of pain we will start with a steroid injection and get her approved again for Orthovisc. Return to see me to start gel when approved.

## 2024-04-10 NOTE — Telephone Encounter (Signed)
 Visco approval please, left knee, x-ray confirmed osteoarthritis, she has failed over 6 weeks of physical therapy, she had Visco back in last summertime, steroid injections done and failed.

## 2024-04-10 NOTE — Addendum Note (Signed)
 Addended by: OLEY ROBIN L on: 04/10/2024 10:14 AM   Modules accepted: Orders

## 2024-04-10 NOTE — Telephone Encounter (Addendum)
 Benefits Investigation Details received from [employer]: Orthovisc/Monovisc PA required: No May fill through: Buy and Bill  OV Copay/Coinsurance: % Product Copay: 20% Administration Coinsurance: % Administration Copay: $20 Deductible: $  New benefits investigation has been started due to previous patient approval being from over 6 months ago.  Deductible does not apply. Once the OOP has been met, patient is covered at 100%. Only one copay per date of service applies.   Prior Authorization is not required.

## 2024-04-10 NOTE — Progress Notes (Signed)
    Procedures performed today:    Procedure: Real-time Ultrasound Guided injection of the left knee Device: Samsung HS60  Verbal informed consent obtained.  Time-out conducted.  Noted no overlying erythema, induration, or other signs of local infection.  Skin prepped in a sterile fashion.  Local anesthesia: Topical Ethyl chloride.  With sterile technique and under real time ultrasound guidance: Mild effusion noted, 1 cc Kenalog  40, 2 cc lidocaine , 2 cc bupivacaine injected easily Completed without difficulty  Advised to call if fevers/chills, erythema, induration, drainage, or persistent bleeding.  Images permanently stored and available for review in PACS.  Impression: Technically successful ultrasound guided injection.  Independent interpretation of notes and tests performed by another provider:   None.  Brief History, Exam, Impression, and Recommendations:    Primary osteoarthritis of left knee Pleasant 89 year old female, knee osteoarthritis, we last did Orthovisc in August 2024. With the recurrence of pain we will start with a steroid injection and get her approved again for Orthovisc. Return to see me to start gel when approved.    ____________________________________________ Debby PARAS. Curtis, M.D., ABFM., CAQSM., AME. Primary Care and Sports Medicine [facility]  Adjunct Professor of [facility] Medicine  [employer]  Restaurant manager, fast food

## 2024-04-14 ENCOUNTER — Telehealth: Payer: Self-pay

## 2024-04-14 DIAGNOSIS — E119 Type 2 diabetes mellitus without complications: Secondary | ICD-10-CM | POA: Diagnosis not present

## 2024-04-14 NOTE — Telephone Encounter (Signed)
 Forwarding to [place] as an Financial planner.  Tonya  Novo Nordisk PAP shipment for Ozempic  2 mg dose / 4 boxes received this morning. Please contact the patient to come and pick up their order today. Placed in the PAP fridge with patient identifier. Thanks in advance.   NDC: 9830-5227-87 LOT: [id number] EXP: 2026-08-14

## 2024-04-20 ENCOUNTER — Telehealth: Payer: Self-pay

## 2024-04-20 ENCOUNTER — Telehealth: Payer: Self-pay | Admitting: Sports Medicine

## 2024-04-20 NOTE — Telephone Encounter (Signed)
 Patient came into office and picked up her Ozempic , thanks

## 2024-04-20 NOTE — Progress Notes (Signed)
   04/20/2024  Patient ID: Diana Parsons, female   DOB: 04/06/1934, 89 y.o.   MRN: [id number]  Missed call/voicemail from patient checking on next shipment of Ozempic  2mg  through [employer] PAP.  Four boxes of medication were received at [facility] last week.  Patient has been made aware and will pick up this week.  Channing DELENA Mealing, PharmD, DPLA

## 2024-04-20 NOTE — Telephone Encounter (Signed)
 Copied from CRM [id number]. Topic: Clinical - Medical Advice >> Apr 20, 2024  8:59 AM Laurier C wrote: Reason for CRM: Patient would like a call back from Dr. Nathalie nurse. Patient is wanting to confirm if her insurance will cover for injections she is needing. Patient can be reached at [phone].

## 2024-04-21 NOTE — Telephone Encounter (Signed)
 Pt has picked up ?

## 2024-04-23 NOTE — Telephone Encounter (Signed)
 Attempted to contact patient regarding Orthovisc injections but unfortunately was sent to voicemail. I have left a voice message requesting a call back at her earliest convenience.

## 2024-04-25 ENCOUNTER — Ambulatory Visit
Admission: EM | Admit: 2024-04-25 | Discharge: 2024-04-25 | Disposition: A | Discharge status: Home / Self Care | Attending: Family Medicine | Admitting: Family Medicine

## 2024-04-25 ENCOUNTER — Other Ambulatory Visit: Payer: Self-pay

## 2024-04-25 DIAGNOSIS — M5442 Lumbago with sciatica, left side: Secondary | ICD-10-CM

## 2024-04-25 MED ORDER — OXYCODONE-ACETAMINOPHEN 5-325 MG PO TABS: 1.0000 | ORAL_TABLET | Freq: Three times a day (TID) | ORAL | 0 refills | Status: DC | PRN

## 2024-04-25 NOTE — ED Triage Notes (Addendum)
 Pt presents to [facility] with co of left hip pain radiating down the back of his leg since last Sunday. Pt reports she has taken motrin with no improvement. Has also been using ice. Has pmh of back surgeries and is worried bc this pain feels very similar to when she had herniated disk.

## 2024-04-25 NOTE — ED Provider Notes (Signed)
 [facility] Pars[facility]    CSN: [id number] Arrival date & time: 04/25/24  1039      History   Chief Complaint Chief Complaint  Patient presents with   Back Pain   Sciatica    HPI Diana Parsons is a 89 y.o. female.   HPI  Very pleasant 89 year old female presents with left hip pain radiating down back of legs since Sunday.  Patient reports previous history of back surgeries and feels like herniated disc.  PMH significant for cancer, T2 DM without complication, and cataract.  Past Medical History:  Diagnosis Date   Arthritis    Cancer (HCC)    Cataract    Diabetes mellitus without complication (HCC)    GERD (gastroesophageal reflux disease)     Patient Active Problem List   Diagnosis Date Noted   Malignant neoplasm of lower-outer quadrant of female breast (HCC) 10/14/2023   Severe obesity (BMI 35.0-35.9 with comorbidity) (HCC) 05/13/2023   Macular degeneration 06/07/2022   Primary osteoarthritis of both first carpometacarpal joints 06/05/2022   Osteopenia 03/07/2022   Chronic constipation 10/05/2021   Edema of right foot 03/06/2021   Combined forms of age-related cataract of left eye 06/02/2020   Aortic valve sclerosis 11/23/2019   Family history of lung cancer 11/09/2019   Orthopnea 11/09/2019   PND (paroxysmal nocturnal dyspnea) 11/09/2019   Esophagitis 11/09/2019   Microalbuminuria due to type 2 diabetes mellitus (HCC) 07/20/2016   Lumbar degenerative disc disease 05/03/2014   Status post lumbar surgery 09/03/2013   Spinal stenosis of lumbar region without neurogenic claudication 08/30/2013   Tear of meniscus of knee 05/29/2013   Primary osteoarthritis of left knee 05/28/2012   Controlled diabetes mellitus type 2 with complications (HCC) 02/19/2007   HYPERCHOLESTEROLEMIA, PURE 02/03/2007   BREAST MASS, LEFT 02/03/2007   POSTMENOPAUSAL STATUS 02/03/2007    Past Surgical History:  Procedure Laterality Date   ABDOMINAL HYSTERECTOMY  89 yrs old   BREAST  BIOPSY Right 09/23/2023   US  RT BREAST BX W LOC DEV 1ST LESION IMG BX SPEC US  GUIDE 09/23/2023 [facility]   BREAST BIOPSY Left 09/23/2023   US  LT BREAST BX W LOC DEV 1ST LESION IMG BX SPEC US  GUIDE 09/23/2023 [facility]   EYE SURGERY  Cataracts august 2021   left shoulder sx s/p fall  10/16/1999   has pin in place   lumbarp spine surgery  07/15/2014   Dr. Melchor   SPINE SURGERY      OB History   No obstetric history on file.      Home Medications    Prior to Admission medications   Medication Sig Start Date End Date Taking? Authorizing Provider  oxyCODONE -acetaminophen  (PERCOCET/ROXICET) 5-325 MG tablet Take 1 tablet by mouth every 8 (eight) hours as needed for up to 5 days for severe pain (pain score 7-10). 04/25/24 04/30/24 Yes Teddy Sharper, FNP  aspirin 81 MG tablet Take 81 mg by mouth daily.    [provider]  bisacodyl (DULCOLAX) 5 MG EC tablet Take 5 mg by mouth daily as needed for moderate constipation.    [provider]  CALCIUM -MAGNESIUM-VITAMIN D  PO Take 3 tablets by mouth daily. 333mg  Calcium  133mg  Magnesium 5mg  Zinc 200 IU Vitamin D3    [provider]  diclofenac  (VOLTAREN ) 75 MG EC tablet Take 1 tablet (75 mg total) by mouth 2 (two) times daily. 06/24/23   Curtis Debby PARAS, MD  diclofenac  Sodium (VOLTAREN ) 1 % GEL Apply 4 g topically 4 (four) times  daily. To affected joint. 05/17/23   Curtis Debby PARAS, MD  famotidine  (PEPCID ) 20 MG tablet Take 1 tablet (20 mg total) by mouth daily. 02/28/24   Alvan Dorothyann BIRCH, MD  fish oil-omega-3 fatty acids 1000 MG capsule Take 1 g by mouth daily.    [provider]  lisinopril  (ZESTRIL ) 5 MG tablet Take 1 tablet (5 mg total) by mouth daily. 01/16/24   Alvan Dorothyann BIRCH, MD  omeprazole  (PRILOSEC) 40 MG capsule Take 1 capsule (40 mg total) by mouth daily. 02/28/24   Alvan Dorothyann BIRCH, MD  polyethylene glycol (MIRALAX / GLYCOLAX) 17 g packet Take 17 g by mouth daily  as needed.    [provider]  rosuvastatin  (CRESTOR ) 20 MG tablet Take 1 tablet (20 mg total) by mouth daily. 03/02/24   Alvan Dorothyann BIRCH, MD  Semaglutide , 2 MG/DOSE, 8 MG/3ML SOPN Inject 2 mg into the skin once a week. 12/13/23   Alvan Dorothyann BIRCH, MD  tamoxifen (NOLVADEX) 20 MG tablet Take 20 mg by mouth daily.    [provider]    Family History Family History  Problem Relation Age of Onset   Cancer Mother        colon   Diabetes Sister    Diabetes Brother    Cancer Brother        lung cancer    Social History Social History   Tobacco Use   Smoking status: Former    Current packs/day: 0.00    Average packs/day: 0.1 packs/day for 3.0 years (0.3 ttl pk-yrs)    Types: Cigarettes    Start date: 04/25/1980    Quit date: 04/26/1983    Years since quitting: 41.0   Smokeless tobacco: Former   Tobacco comments:    Smoked very little for a period of about 3 to 4 years.  Vaping Use   Vaping status: Never Used  Substance Use Topics   Alcohol use: No   Drug use: No     Allergies   Nickel   Review of Systems Review of Systems  Musculoskeletal:  Positive for back pain.     Physical Exam Triage Vital Signs ED Triage Vitals  Encounter Vitals Group     BP 04/25/24 1115 (!) 154/70     Girls Systolic BP Percentile --      Girls Diastolic BP Percentile --      Boys Systolic BP Percentile --      Boys Diastolic BP Percentile --      Pulse Rate 04/25/24 1115 98     Resp 04/25/24 1115 19     Temp 04/25/24 1115 98.1 F (36.7 C)     Temp src --      SpO2 04/25/24 1115 98 %     Weight --      Height --      Head Circumference --      Peak Flow --      Pain Score 04/25/24 1114 9     Pain Loc --      Pain Education --      Exclude from Growth Chart --    No data found.  Updated Vital Signs BP (!) 154/70   Pulse 98   Temp 98.1 F (36.7 C)   Resp 19   SpO2 98%    Physical Exam Vitals and nursing note reviewed.  Constitutional:       Appearance: Normal appearance. She is obese.  HENT:     Head: Normocephalic and  atraumatic.     Mouth/Throat:     Mouth: Mucous membranes are moist.     Pharynx: Oropharynx is clear.  Eyes:     Extraocular Movements: Extraocular movements intact.     Conjunctiva/sclera: Conjunctivae normal.     Pupils: Pupils are equal, round, and reactive to light.  Cardiovascular:     Rate and Rhythm: Normal rate and regular rhythm.     Pulses: Normal pulses.     Heart sounds: Normal heart sounds.  Pulmonary:     Effort: Pulmonary effort is normal.     Breath sounds: Normal breath sounds. No wheezing, rhonchi or rales.  Musculoskeletal:        General: Normal range of motion.     Comments: Patient reporting pain at left lower back radiating over left hip left buttocks to left lower leg.  Skin:    General: Skin is warm and dry.  Neurological:     General: No focal deficit present.     Mental Status: She is alert and oriented to person, place, and time. Mental status is at baseline.  Psychiatric:        Mood and Affect: Mood normal.        Behavior: Behavior normal.      UC Treatments / Results  Labs (all labs ordered are listed, but only abnormal results are displayed) Labs Reviewed - No data to display  EKG   Radiology No results found.  Procedures Procedures (including critical care time)  Medications Ordered in UC Medications - No data to display  Initial Impression / Assessment and Plan / UC Course  I have reviewed the triage vital signs and the nursing notes.  Pertinent labs & imaging results that were available during my care of the patient were reviewed by me and considered in my medical decision making (see chart for details).     MDM: 1.  Acute midline low back pain with left-sided sciatica-Rx'd Percocet 5/325 mg tablet: Take 1 tablet every 8 hours, as needed for left-sided back pain. Advised patient to take medication as directed with food.  Patient advised of  sedative effects of this medication.  Encouraged to increase daily water intake to 64 ounces per day while taking this medication.  Advised if symptoms worsen and/or unresolved please follow-up with your PCP or here for further evaluation.  Patient discharged home, hemodynamically stable. Final Clinical Impressions(s) / UC Diagnoses   Final diagnoses:  Acute midline low back pain with left-sided sciatica     Discharge Instructions      Advised patient to take medication as directed with food.  Patient advised of sedative effects of this medication.  Encouraged to increase daily water intake to 64 ounces per day while taking this medication.  Advised if symptoms worsen and/or unresolved please follow-up with your PCP or here for further evaluation.     ED Prescriptions     Medication Sig Dispense Auth. Provider   oxyCODONE -acetaminophen  (PERCOCET/ROXICET) 5-325 MG tablet Take 1 tablet by mouth every 8 (eight) hours as needed for up to 5 days for severe pain (pain score 7-10). 15 tablet Jvion Turgeon, FNP      I have reviewed the PDMP during this encounter.   Teddy Sharper, FNP 04/25/24 1203

## 2024-04-25 NOTE — Discharge Instructions (Addendum)
 Advised patient to take medication as directed with food.  Patient advised of sedative effects of this medication.  Encouraged to increase daily water intake to 64 ounces per day while taking this medication.  Advised if symptoms worsen and/or unresolved please follow-up with your PCP or here for further evaluation.

## 2024-04-27 DIAGNOSIS — Z7985 Long-term (current) use of injectable non-insulin antidiabetic drugs: Secondary | ICD-10-CM | POA: Diagnosis not present

## 2024-04-27 DIAGNOSIS — Z87891 Personal history of nicotine dependence: Secondary | ICD-10-CM | POA: Diagnosis not present

## 2024-04-27 DIAGNOSIS — M79605 Pain in left leg: Secondary | ICD-10-CM | POA: Diagnosis not present

## 2024-04-27 DIAGNOSIS — M47816 Spondylosis without myelopathy or radiculopathy, lumbar region: Secondary | ICD-10-CM | POA: Diagnosis not present

## 2024-04-27 DIAGNOSIS — E119 Type 2 diabetes mellitus without complications: Secondary | ICD-10-CM | POA: Diagnosis not present

## 2024-04-27 DIAGNOSIS — I10 Essential (primary) hypertension: Secondary | ICD-10-CM | POA: Diagnosis not present

## 2024-04-27 DIAGNOSIS — Z7982 Long term (current) use of aspirin: Secondary | ICD-10-CM | POA: Diagnosis not present

## 2024-04-27 DIAGNOSIS — M545 Low back pain, unspecified: Secondary | ICD-10-CM | POA: Diagnosis not present

## 2024-04-27 NOTE — Telephone Encounter (Signed)
 I have attempted to contact patient twice but unfortunately I am sent to voicemail. I have left voice messages requesting a call back to our office at her earliest convenience.   Patient has returned our calls and I was able to advise of update in Orthovisc injections. Patient advised she will be calling back to schedule appointment when she is ready.

## 2024-04-29 ENCOUNTER — Ambulatory Visit (INDEPENDENT_AMBULATORY_CARE_PROVIDER_SITE_OTHER): Admitting: Family Medicine

## 2024-04-29 ENCOUNTER — Encounter: Payer: Self-pay | Admitting: Family Medicine

## 2024-04-29 VITALS — BP 123/68 | HR 83 | Ht 66.0 in | Wt 207.1 lb

## 2024-04-29 DIAGNOSIS — K5909 Other constipation: Secondary | ICD-10-CM | POA: Diagnosis not present

## 2024-04-29 DIAGNOSIS — K5900 Constipation, unspecified: Secondary | ICD-10-CM

## 2024-04-29 DIAGNOSIS — M5410 Radiculopathy, site unspecified: Secondary | ICD-10-CM

## 2024-04-29 MED ORDER — LORAZEPAM 1 MG PO TABS: 1.0000 mg | ORAL_TABLET | Freq: Once | ORAL | 0 refills | Status: DC | PRN

## 2024-04-29 MED ORDER — PREDNISONE 20 MG PO TABS: 40.0000 mg | ORAL_TABLET | Freq: Every day | ORAL | 0 refills | Status: DC

## 2024-04-29 NOTE — Progress Notes (Unsigned)
 Established Patient Office Visit  Subjective  Patient ID: Diana Parsons, female    DOB: Dec 31, 1933  Age: 89 y.o. MRN: [id number]  Chief Complaint  Patient presents with   Follow-up    HPI  [name] to the ED with left sided back pain on 7/12 and 7/14. Had back surgery 10 years ago.  The pain actually radiates down left side into her buttocks and occasionally even to her knee and foot.  She has been using heat and ice.  On one of the ED visit she was given a short supply of oxycodone  but says that she did not feel like it really was all that helpful.  She is also having constipation - just passing air. She has been taking meds and no relief.  She has used some smooth move tea as well as Senokot.  XR Spine  Lumbar 2-3 Views  Anatomical Region Laterality Modality  T-spine -- Computed Radiography  L-spine -- --  Pelvis -- --   Impression  IMPRESSION: Severe lumbar spondylosis without acute process.  Electronically Signed by: Bernard LULLA Blanch MD, MBA on 04/27/2024 2:08 PM Narrative  INDICATION: Low back pain  COMPARISON: Single view of the lumbar spine 08/13/2014  TECHNIQUE: 3 views of the lumbar spine  FINDINGS: Loss of usual lumbar lordosis. Scattered severe degenerative changes are present. Milder foraminal narrowing is present at L4-L5 and L5-S1. Mild retrolisthesis of L5 on S1. No acute fracture or compression deformity. Unremarkable paraspinal soft tissues. Procedure Note  Blanch Bernard LULLA, MD - 04/27/2024 Formatting of this note might be different from the original. INDICATION: Low back pain  COMPARISON: Single view of the lumbar spine 08/13/2014  TECHNIQUE: 3 views of the lumbar spine  FINDINGS: Loss of usual lumbar lordosis. Scattered severe degenerative changes are present. Milder foraminal narrowing is present at L4-L5 and L5-S1. Mild retrolisthesis of L5 on S1. No acute fracture or compression deformity. Unremarkable paraspinal soft tissues.   IMPRESSION: Severe  lumbar spondylosis without acute process.    ROS    Objective:     BP 123/68   Pulse 83   Ht 5' 6 (1.676 m)   Wt 207 lb 1.9 oz (93.9 kg)   SpO2 98%   BMI 33.43 kg/m    Physical Exam Vitals reviewed.  Constitutional:      Appearance: Normal appearance.  HENT:     Head: Normocephalic.  Pulmonary:     Effort: Pulmonary effort is normal.  Abdominal:     General: There is no distension.     Palpations: Abdomen is soft.     Tenderness: There is no abdominal tenderness.  Neurological:     Mental Status: She is alert and oriented to person, place, and time.  Psychiatric:        Mood and Affect: Mood normal.        Behavior: Behavior normal.      No results found for any visits on 04/29/24.    The ASCVD Risk score (Arnett DK, et al., 2019) failed to calculate for the following reasons:   The 2019 ASCVD risk score is only valid for ages 31 to 50    Assessment & Plan:   Problem List Items Addressed This Visit       Digestive   Chronic constipation   Okay to increase Senokot to twice a day if needed.  Make sure staying well-hydrated she also uses MiraLAX.  The GLP-1 may be contributing somewhat.  If not improving then please let  me know.      Other Visit Diagnoses       Constipation, unspecified constipation type    -  Primary   Relevant Orders   MR Lumbar Spine Wo Contrast     Radicular low back pain       Relevant Medications   predniSONE  (DELTASONE ) 20 MG tablet   Other Relevant Orders   MR Lumbar Spine Wo Contrast      Radicular low back pain she also has a history of spinal stenosis.  We discussed getting further workup with MRI of the lumbar spine.  We discussed potential trial of prednisone  as well just for pain relief to see if it is helpful.  And a muscle relaxer as well.  Did give her a couple of tabs Ativan  to take before her MRI as she does get a little claustrophobic just reminded her that she will need someone to drive her if she takes the  medication.  No follow-ups on file.    Dorothyann Byars, MD

## 2024-05-01 NOTE — Assessment & Plan Note (Signed)
 Okay to increase Senokot to twice a day if needed.  Make sure staying well-hydrated she also uses MiraLAX.  The GLP-1 may be contributing somewhat.  If not improving then please let me know.

## 2024-05-03 ENCOUNTER — Ambulatory Visit

## 2024-05-03 DIAGNOSIS — M4726 Other spondylosis with radiculopathy, lumbar region: Secondary | ICD-10-CM | POA: Diagnosis not present

## 2024-05-03 DIAGNOSIS — M5410 Radiculopathy, site unspecified: Secondary | ICD-10-CM | POA: Diagnosis not present

## 2024-05-03 DIAGNOSIS — M48061 Spinal stenosis, lumbar region without neurogenic claudication: Secondary | ICD-10-CM | POA: Diagnosis not present

## 2024-05-03 DIAGNOSIS — M5116 Intervertebral disc disorders with radiculopathy, lumbar region: Secondary | ICD-10-CM | POA: Diagnosis not present

## 2024-05-03 DIAGNOSIS — M4807 Spinal stenosis, lumbosacral region: Secondary | ICD-10-CM | POA: Diagnosis not present

## 2024-05-03 DIAGNOSIS — K5900 Constipation, unspecified: Secondary | ICD-10-CM

## 2024-05-04 ENCOUNTER — Other Ambulatory Visit: Payer: Self-pay | Admitting: Family Medicine

## 2024-05-04 ENCOUNTER — Ambulatory Visit: Payer: Self-pay | Admitting: *Deleted

## 2024-05-04 DIAGNOSIS — E118 Type 2 diabetes mellitus with unspecified complications: Secondary | ICD-10-CM

## 2024-05-04 NOTE — Telephone Encounter (Signed)
 Reason for Disposition  [1] SEVERE back pain (e.g., excruciating, unable to do any normal activities) AND [2] not improved 2 hours after pain medicine    I'm sending her a message for your pain medications.  Answer Assessment - Initial Assessment Questions 1. ONSET: When did the pain begin? (e.g., minutes, hours, days)     I'm having lower back pain.   Went to urgent care last Monday.   X ray done.   I have deteriation in my back.  I've had 2 back surgeries.   I need to have an MRI which was done [facility] 7/20.  I saw Dr. Alvan for my back on 7/14 and she set up the MRI.   They were able to do it yesterday.   They said the results would not be ready for a week.  I can't wait a week for the results then a referral.    I'm in so much pain.   I only have 2 pills left of the oxycodone  which does not help.   It doesn't really work that well. Dr. Alvan put me on prednisone . That worked the [employer].   I took the last one yesterday.   I have 3 pills left of the muscle relaxer left, Robaxin  500 mg. They also put me on Tramadol .  I need a refill of the Tramadol  too.   I need all 3 of those filled at the [facility].   Does not need the oxycodone .   2. LOCATION: Where does it hurt? (upper, mid or lower back)     Lower back pain.    The MRI was done 7/20.    I need a refill of these medications.   I can't sleep or sit and pt is crying.   Walking hurts so back.  Pt crying in pain. 3. SEVERITY: How bad is the pain?  (e.g., Scale 1-10; mild, moderate, or severe)     Severe  I saw Dr. Alvan on Wed.  She set up the MRI for me.   4. PATTERN: Is the pain constant? (e.g., yes, no; constant, intermittent)      Constant 5. RADIATION: Does the pain shoot into your legs or somewhere else?     Not asked 6. CAUSE:  What do you think is causing the back pain?      I've seen Dr. Alvan.    I know I need a referral for my back pain. 7. BACK OVERUSE:  Any recent lifting of heavy objects,  strenuous work or exercise?     I need refill of the Tramadol  and Robaxin  and the prednisone     The prednisone  was really helping the most. 8. MEDICINES: What have you taken so far for the pain? (e.g., nothing, acetaminophen , NSAIDS)     See above for medications. 9. NEUROLOGIC SYMPTOMS: Do you have any weakness, numbness, or problems with bowel/bladder control?     Not asked since saw Dr. Alvan 04/27/2024. 10. OTHER SYMPTOMS: Do you have any other symptoms? (e.g., fever, abdomen pain, burning with urination, blood in urine)       Severe back pain 11. PREGNANCY: Is there any chance you are pregnant? When was your last menstrual period?       N/A due to age  Protocols used: Back Pain-A-AH FYI Only or Action Required?: Action required by provider: medication refill request and update on patient condition.  Patient was last seen in primary care on 04/29/2024 by Alvan Dorothyann BIRCH, MD.  Called Nurse  Triage reporting Back Pain.  Symptoms began several days ago.  Interventions attempted: Prescription medications: Needs refills on prednisone , Tramadol  and Robaxin .   Had MRI done 7/20.  Said it would be a week for results to come in.  Pt crying in pain cannot wait that long  Needing refills on medications.  Also a referral for her back.   Wanted Dr. Alvan to know the MRI was done..    Symptoms are: rapidly worsening.  Severe back pain    Please send refills to the [facility].   Triage Disposition: Call PCP Now  Patient/caregiver understands and will follow disposition?: Yes

## 2024-05-05 ENCOUNTER — Telehealth: Payer: Self-pay | Admitting: Family Medicine

## 2024-05-05 ENCOUNTER — Other Ambulatory Visit: Payer: Self-pay | Admitting: Family Medicine

## 2024-05-05 ENCOUNTER — Other Ambulatory Visit: Payer: Self-pay | Admitting: Sports Medicine

## 2024-05-05 DIAGNOSIS — M5410 Radiculopathy, site unspecified: Secondary | ICD-10-CM

## 2024-05-05 DIAGNOSIS — M1712 Unilateral primary osteoarthritis, left knee: Secondary | ICD-10-CM

## 2024-05-05 MED ORDER — METHOCARBAMOL 500 MG PO TABS: 500.0000 mg | ORAL_TABLET | Freq: Four times a day (QID) | ORAL | 0 refills | Status: DC | PRN

## 2024-05-05 MED ORDER — PREDNISONE 20 MG PO TABS: 40.0000 mg | ORAL_TABLET | Freq: Every day | ORAL | 0 refills | Status: DC

## 2024-05-05 MED ORDER — TRAMADOL HCL 50 MG PO TABS: 50.0000 mg | ORAL_TABLET | Freq: Three times a day (TID) | ORAL | 1 refills | Status: AC | PRN

## 2024-05-05 NOTE — Telephone Encounter (Signed)
 Please clarify what medicine is she talking about prednisone  or is she talking about some type of other pain medication?  I think she is gone for MRI but I do not see the results back yet.

## 2024-05-05 NOTE — Telephone Encounter (Signed)
 Per Dr. Alvan - as addendum to her message - she had read the E2C2 note and did refill the  methobarbamaol , prednisone  and tramadol  to [facility] pharmacy. Patient was informed of this and that we had received the MRI results and once Dr. Alvan has an oppoprtunity to review this we will give her a call with the results.

## 2024-05-05 NOTE — Telephone Encounter (Signed)
 Patient called.  She is in a lot of pain/in tears and would like a refill on her pain meds.

## 2024-05-06 ENCOUNTER — Ambulatory Visit: Payer: Self-pay | Admitting: Family Medicine

## 2024-05-06 ENCOUNTER — Other Ambulatory Visit: Payer: Self-pay | Admitting: *Deleted

## 2024-05-06 DIAGNOSIS — M5410 Radiculopathy, site unspecified: Secondary | ICD-10-CM

## 2024-05-06 NOTE — Telephone Encounter (Signed)
 Pt called office and requests referral for dr. Alm [facility] ph: [phone].

## 2024-05-06 NOTE — Progress Notes (Signed)
 Hi Diana Parsons, MRI of the lumbar spine shows a pinching of the L4 nerve root on the left side.  You also have some narrowing in the spinal canal which is a little bit worse on the right side at L3-4 compared to the left.  You also have some narrowing where the nerve roots come out of the spine also at L3-L4.  Basically have a lot of arthritis in your back.  Neck step would be to consider getting you in with either our sports med doc to see if you might be a candidate for injections again or we can refer you to an orthopedist if you would prefer.

## 2024-05-06 NOTE — Telephone Encounter (Signed)
 Ok to place ref and let pt knwo when done

## 2024-05-06 NOTE — Telephone Encounter (Signed)
 Pt called and advised

## 2024-05-07 ENCOUNTER — Telehealth: Payer: Self-pay

## 2024-05-07 NOTE — Telephone Encounter (Signed)
 Referral, clinical notes, imaging reports, demographics and copies of insurance cards have been faxed to [facility] at [phone]. Office will contact patient to schedule referral appointment.

## 2024-05-07 NOTE — Telephone Encounter (Unsigned)
 Copied from CRM [id number]. Topic: Referral - Status >> May 07, 2024 10:33 AM Cherylann RAMAN wrote: Reason for CRM: Patient called to provide the fax number to have her referral sent over to. Please fax referral Alm [facility] Fax: [phone]

## 2024-05-07 NOTE — Progress Notes (Signed)
   05/07/2024  Patient ID: Diana Parsons, female   DOB: [birth year], 89 y.o.   MRN: [id number]  This patient is appearing on a report for being at risk of failing the adherence measure for cholesterol (statin) medications this calendar year.   Medication: rosuvastatin  20mg  Last fill date: 04/04/2024 for 90 day supply  Insurance report was not up to date. No action needed at this time.   Diana Parsons, PharmD, DPLA

## 2024-05-11 DIAGNOSIS — G8929 Other chronic pain: Secondary | ICD-10-CM | POA: Diagnosis not present

## 2024-05-11 DIAGNOSIS — M5442 Lumbago with sciatica, left side: Secondary | ICD-10-CM | POA: Diagnosis not present

## 2024-05-12 DIAGNOSIS — M47816 Spondylosis without myelopathy or radiculopathy, lumbar region: Secondary | ICD-10-CM | POA: Diagnosis not present

## 2024-05-12 DIAGNOSIS — M48061 Spinal stenosis, lumbar region without neurogenic claudication: Secondary | ICD-10-CM | POA: Diagnosis not present

## 2024-05-15 DIAGNOSIS — E119 Type 2 diabetes mellitus without complications: Secondary | ICD-10-CM | POA: Diagnosis not present

## 2024-05-27 ENCOUNTER — Ambulatory Visit: Payer: Self-pay

## 2024-05-27 NOTE — Telephone Encounter (Signed)
 Please contact patient if earlier availability becomes available and/or if provider would like her to change/add any home treatment prior to her visit.  Reason for Disposition  Unable to have a bowel movement (BM) without laxative or enema  Answer Assessment - Initial Assessment Questions On June 10, began experiencing sciatica pain and had MRI. 2 weeks ago, had an epidural. States the stinging/tingling pain has improved but she is severely constipated that she has to use enemas or suppositories for. States that between the constipation and back pain, it is uncomfortable to sit. Asking if she should d/c Semaglutide  in case that is causing the constipation.  1. ONSET: When did the pain begin? (e.g., minutes, hours, days)     *No Answer* 2. LOCATION: Where does it hurt? (upper, mid or lower back)     *No Answer* 3. SEVERITY: How bad is the pain?  (e.g., Scale 1-10; mild, moderate, or severe)     *No Answer* 4. PATTERN: Is the pain constant? (e.g., yes, no; constant, intermittent)      *No Answer* 5. RADIATION: Does the pain shoot into your legs or somewhere else?     *No Answer* 6. CAUSE:  What do you think is causing the back pain?      *No Answer* 7. BACK OVERUSE:  Any recent lifting of heavy objects, strenuous work or exercise?     *No Answer* 8. MEDICINES: What have you taken so far for the pain? (e.g., nothing, acetaminophen , NSAIDS)     *No Answer* 9. NEUROLOGIC SYMPTOMS: Do you have any weakness, numbness, or problems with bowel/bladder control?     *No Answer* 10. OTHER SYMPTOMS: Do you have any other symptoms? (e.g., fever, abdomen pain, burning with urination, blood in urine)       *No Answer* 11. PREGNANCY: Is there any chance you are pregnant? When was your last menstrual period?       *No Answer*  Answer Assessment - Initial Assessment Questions On June 10, patient began experiencing sciatica pain and had MRI. 2 weeks ago, had an epidural. States the  stinging/tingling pain has improved but she is constipated (see visit note) and is using 1/2 cap of Miralax BID, dulcolax, suppositories and fiber. States that between the constipation and back pain, it is uncomfortable to sit. Asking if she should d/c Semaglutide  in case that is causing the constipation. Most recent dose was 05/26/24. Denies: dizziness/lightheadedness while toileting, abdominal pain, nausea, vomiting or blood in her stool.  Scheduled for soonest available in office appointment on 05/29/24. Will request provider's office to contact patient if earlier availability becomes available and/or if provider would like her to change/add any home treatment prior to her visit.  STOOL PATTERN OR FREQUENCY: How often do you have a bowel movement (BM)?  (Normal range: 3 times a day to every 3 days)  When was your last BM?       Last BM today, states was small and firm  STRAINING: Do you have to strain to have a BM?      Yes  ONSET: When did the constipation begin?     Chronic, but worse since starting semaglutide   RECTAL PAIN: Does your rectum hurt when the stool comes out? If Yes, ask: Do you have hemorrhoids? How bad is the pain?  (Scale 1-10; or mild, moderate, severe)     Denies, but states lower back hurts when she sits  5. BM COMPOSITION: Are the stools hard?      Yes  6.  BLOOD ON STOOLS: Has there been any blood on the toilet tissue or on the surface  of the BM? If Yes, ask: When was the last time?     Denies  CHRONIC CONSTIPATION: Is this a new problem for you?  If No, ask: How long have you had this problem? (days, weeks, months)      Yes  CHANGES IN DIET OR HYDRATION: Have there been any recent changes in your diet? How much fluids are you drinking on a daily basis?  How much have you had to drink today?     Denies  LAXATIVES: Have you been using any stool softeners, laxatives, or enemas?  If Yes, ask What are you using, how often, and when was  the last time?       Miralax, dulcolax, suppositories, fiber  OTHER SYMPTOMS: Do you have any other symptoms? (e.g., abdomen pain, bloating, fever, vomiting)       Denies  Protocols used: Back Pain-A-AH, Constipation-A-AH Copied from CRM #[id number]. Topic: Clinical - Red Word Triage >> May 27, 2024  4:23 PM Mercer PEDLAR wrote: Red Word that prompted transfer to Nurse Triage: Patient is having pain when she tries to sit down. She stated that she had an epidural for deterioration in her back and is also experiencing constipation due to being on Semaglutide , 2 MG/DOSE, 8 MG/3ML SOPN.

## 2024-05-28 NOTE — Telephone Encounter (Signed)
 Patient scheduled for 05/29/2024 with Jade Breeback

## 2024-05-29 ENCOUNTER — Ambulatory Visit (INDEPENDENT_AMBULATORY_CARE_PROVIDER_SITE_OTHER): Admitting: Physician Assistant

## 2024-05-29 VITALS — BP 138/60 | HR 100 | Ht 66.0 in | Wt 200.0 lb

## 2024-05-29 DIAGNOSIS — K644 Residual hemorrhoidal skin tags: Secondary | ICD-10-CM | POA: Diagnosis not present

## 2024-05-29 DIAGNOSIS — K5909 Other constipation: Secondary | ICD-10-CM

## 2024-05-29 MED ORDER — LUBIPROSTONE 24 MCG PO CAPS: 24.0000 ug | ORAL_CAPSULE | Freq: Two times a day (BID) | ORAL | 1 refills | Status: DC

## 2024-05-29 NOTE — Patient Instructions (Signed)
 Trial of amitiza  twice a day with meals for next month and follow up with PCP.

## 2024-05-29 NOTE — Progress Notes (Unsigned)
   Acute Office Visit  Subjective:     Patient ID: Diana Parsons, female    DOB: Aug 21, 1934, 89 y.o.   MRN: [id number]  Chief Complaint  Patient presents with   Medical Management of Chronic Issues    Constipation on set for weeks  took miralax, colace, and smoove move and stool softners    HPI Patient is in today for constipation. She has had chronic constipation for a long while but she feels like it is worsening. She is on ozempic  but the dose has stayed the same and it has really helped her sugars and weight loss. She uses colace, miralax, ducolax and lots of OTC treatments intermittently but they just do not seem to be working. She has noticed a hemorrhoid in anal area that is irritating and worse when she is seating. She has used preparation H and is helpful. She denies any melena or hematochezia.   ROS See HPI.      Objective:    BP 138/60   Pulse 100   Ht 5' 6 (1.676 m)   Wt 200 lb (90.7 kg)   SpO2 (!) 9%   BMI 32.28 kg/m  BP Readings from Last 3 Encounters:  05/29/24 138/60  04/29/24 123/68  04/25/24 (!) 154/70   Wt Readings from Last 3 Encounters:  05/29/24 200 lb (90.7 kg)  04/29/24 207 lb 1.9 oz (93.9 kg)  04/06/24 214 lb (97.1 kg)      Physical Exam Constitutional:      Appearance: Normal appearance.  HENT:     Head: Normocephalic.  Cardiovascular:     Rate and Rhythm: Normal rate.  Pulmonary:     Effort: Pulmonary effort is normal.  Abdominal:     General: Bowel sounds are normal. There is no distension.     Palpations: Abdomen is soft. There is no mass.     Tenderness: There is no abdominal tenderness. There is no guarding or rebound.     Hernia: No hernia is present.  Genitourinary:    Comments: RECTAL exam: non-thrombosed hemorrhoid skin tags coming from rectum.  Neurological:     Mental Status: She is alert.  Psychiatric:        Mood and Affect: Mood normal.          Assessment & Plan:  SABRASABRAShynice was seen today for medical management  of chronic issues.  Diagnoses and all orders for this visit:  Chronic constipation -     lubiprostone  (AMITIZA ) 24 MCG capsule; Take 1 capsule (24 mcg total) by mouth 2 (two) times daily with a meal.  Residual hemorrhoidal skin tags  Insurance looked like trulance/linzess was not covered Trial of amitiza  bid  Continue to take stool softener, colace Continue to drink plenty of water and avoid foods that make constipation worse Continue to use preparation H for hemorrhoid Consider witch hazel pads to wipe with  Follow up with PCP in next month to see how things are improving    Return in about 1 month (around 06/29/2024) for PCP constipation.  Cierah Crader, PA-C

## 2024-06-01 ENCOUNTER — Encounter: Payer: Self-pay | Admitting: Physician Assistant

## 2024-06-01 DIAGNOSIS — K644 Residual hemorrhoidal skin tags: Secondary | ICD-10-CM | POA: Insufficient documentation

## 2024-06-02 ENCOUNTER — Telehealth: Payer: Self-pay

## 2024-06-02 NOTE — Telephone Encounter (Signed)
 Called patient, patient states that she would like to get orthovisc inj in her knees, please advise, thanks.

## 2024-06-02 NOTE — Telephone Encounter (Signed)
 Yes please okay to work on [employer].

## 2024-06-03 NOTE — Telephone Encounter (Signed)
[employer] paperwork faxed to [employer] at [phone] ?Request is for Orthovisc ?Fax confirmation receipt received  ?

## 2024-06-04 DIAGNOSIS — Z961 Presence of intraocular lens: Secondary | ICD-10-CM | POA: Diagnosis not present

## 2024-06-04 DIAGNOSIS — H353231 Exudative age-related macular degeneration, bilateral, with active choroidal neovascularization: Secondary | ICD-10-CM | POA: Diagnosis not present

## 2024-06-04 DIAGNOSIS — H35363 Drusen (degenerative) of macula, bilateral: Secondary | ICD-10-CM | POA: Diagnosis not present

## 2024-06-04 DIAGNOSIS — E113293 Type 2 diabetes mellitus with mild nonproliferative diabetic retinopathy without macular edema, bilateral: Secondary | ICD-10-CM | POA: Diagnosis not present

## 2024-06-05 NOTE — Telephone Encounter (Addendum)
 Benefits Investigation Details received from [employer] Injection: Orthovisc PA required: No May fill through: [facility] Copay/Coinsurance: % Product Copay: 20% Administration Coinsurance: % Administration Copay: $20 Deductible: $  Deductible does not apply. Once the OOP has been met , patient is covered at 100%. Only on copay is required per date of service.   Prior Authorization is not required.  Patients copay is $160 per date of service.  Patient has been advised and is scheduled for 06/11/2024 to begin Orthovisc series.

## 2024-06-11 ENCOUNTER — Ambulatory Visit: Admitting: Sports Medicine

## 2024-06-11 NOTE — Telephone Encounter (Signed)
 Patient was contacted by Diana Parsons to schedule visit with Dr. Charles for below.  Patient states she can not drive to [facility].  She is going to try to schedule with [facility].

## 2024-06-12 DIAGNOSIS — K5902 Outlet dysfunction constipation: Secondary | ICD-10-CM | POA: Diagnosis not present

## 2024-06-15 DIAGNOSIS — E119 Type 2 diabetes mellitus without complications: Secondary | ICD-10-CM | POA: Diagnosis not present

## 2024-06-16 ENCOUNTER — Encounter: Payer: Self-pay | Admitting: Sports Medicine

## 2024-06-18 ENCOUNTER — Ambulatory Visit (INDEPENDENT_AMBULATORY_CARE_PROVIDER_SITE_OTHER): Admitting: Family Medicine

## 2024-06-18 ENCOUNTER — Encounter: Payer: Self-pay | Admitting: Family Medicine

## 2024-06-18 VITALS — BP 134/60 | HR 100 | Ht 66.0 in | Wt 204.0 lb

## 2024-06-18 DIAGNOSIS — M25561 Pain in right knee: Secondary | ICD-10-CM

## 2024-06-18 DIAGNOSIS — M25562 Pain in left knee: Secondary | ICD-10-CM

## 2024-06-18 DIAGNOSIS — R000 Tachycardia, unspecified: Secondary | ICD-10-CM | POA: Diagnosis not present

## 2024-06-18 DIAGNOSIS — G8929 Other chronic pain: Secondary | ICD-10-CM

## 2024-06-18 DIAGNOSIS — Z23 Encounter for immunization: Secondary | ICD-10-CM

## 2024-06-18 NOTE — Progress Notes (Signed)
 Acute Office Visit  Subjective:     Patient ID: Diana Parsons, female    DOB: 04-25-1934, 89 y.o.   MRN: [id number]  Chief Complaint  Patient presents with   Tachycardia    HPI Patient is in today for elevated heart rate.    Discussed the use of AI scribe software for clinical note transcription with the patient, who gave verbal consent to proceed.  History of Present Illness Diana Parsons is an 89 year old female who presents with elevated heart rate and constipation.  Tachycardia - Elevated heart rate with typical readings in the 80s to 90s, recently increasing to around 100 and sometimes higher since approximately July 10th - No sensation of heart racing or chest pressure - No use of medications that would increase heart rate, such as decongestants - Minimal swelling in feet and hands  Musculoskeletal pain - Severe pain in legs and back began around July 10th - Pain rated as 10 out of 10 - Pain persisted until epidural administered on July 29th - Epidural took almost a month to take effect  Constipation - Constipation present prior to current visit - Initially prescribed Amitiza , switched to Linzess due to insurance issues - Currently taking Linzess with improvement in bowel movements; stools are better formed and more normal - Two Linzess sample pills remaining - No blood in stool  Medication use - Current medications include Tylenol  8-hour for arthritis, taken once daily, and ibuprofen, alternated with Tylenol  - No new medications aside from Linzess    ROS      Objective:    BP 134/60   Pulse 100   Ht 5' 6 (1.676 m)   Wt 204 lb (92.5 kg)   SpO2 96%   BMI 32.93 kg/m    Physical Exam Vitals and nursing note reviewed.  Constitutional:      Appearance: Normal appearance.  HENT:     Head: Normocephalic and atraumatic.  Eyes:     Conjunctiva/sclera: Conjunctivae normal.  Neck:     Vascular: No carotid bruit.  Cardiovascular:     Rate and  Rhythm: Normal rate and regular rhythm.     Heart sounds: Murmur heard.     Comments: 2/6 SEM best heard at right sternal border.  Pulmonary:     Effort: Pulmonary effort is normal.     Breath sounds: Normal breath sounds.  Skin:    General: Skin is warm and dry.  Neurological:     Mental Status: She is alert.  Psychiatric:        Mood and Affect: Mood normal.     No results found for any visits on 06/18/24.      Assessment & Plan:   Problem List Items Addressed This Visit   None Visit Diagnoses       Tachycardia    -  Primary   Relevant Orders   EKG 12-Lead   CMP14+EGFR   TSH   CBC with Differential/Platelet     Chronic pain of both knees       Relevant Orders   Ambulatory referral to Orthopedic Surgery      Assessment and Plan Assessment & Plan Tachycardia Elevated heart rate with normal sinus rhythm on EKG. Differential includes thyroid dysfunction, anemia, or electrolyte imbalances. - Order blood work for thyroid function, hemoglobin, calcium , and sodium levels. - Consider echocardiogram if blood work is normal. - Consider heart monitor if further investigation is needed. - no sign of afib on EKG today.  -  Echo done 2021 showing aorti valve sclerosis.   Lumbar radiculopathy Severe leg and back pain improved post-epidural, now tolerable with Tylenol  and ibuprofen. Better after epidural   Constipation Improved bowel movements with Linzess after switch from Amitiza  per GI recommendation   Knee pain Pain in knee opposite to previously operated one, possibly due to altered gait from walker use. - Refer to [facility] for evaluation and possible x-ray of both knees. - Ensure release form is signed for referral.   EKG shows rate of 100 bpm, normal sinus rhythm with no acute ST-T wave changes we could not find an old 1 for comparison she remembers having 1 years ago for preop.   No orders of the defined types were placed in this encounter.   No  follow-ups on file.  Diana Byars, MD

## 2024-06-19 ENCOUNTER — Ambulatory Visit: Payer: Self-pay | Admitting: Family Medicine

## 2024-06-19 DIAGNOSIS — M25561 Pain in right knee: Secondary | ICD-10-CM | POA: Diagnosis not present

## 2024-06-19 DIAGNOSIS — M25562 Pain in left knee: Secondary | ICD-10-CM | POA: Diagnosis not present

## 2024-06-19 DIAGNOSIS — D696 Thrombocytopenia, unspecified: Secondary | ICD-10-CM | POA: Insufficient documentation

## 2024-06-19 DIAGNOSIS — M170 Bilateral primary osteoarthritis of knee: Secondary | ICD-10-CM | POA: Diagnosis not present

## 2024-06-19 LAB — CBC WITH DIFFERENTIAL/PLATELET
Basophils Absolute: 0.1 x10E3/uL (ref 0.0–0.2)
Basos: 1 %
EOS (ABSOLUTE): 0.3 x10E3/uL (ref 0.0–0.4)
Eos: 4 %
Hematocrit: 38.1 % (ref 34.0–46.6)
Hemoglobin: 12.3 g/dL (ref 11.1–15.9)
Immature Grans (Abs): 0 x10E3/uL (ref 0.0–0.1)
Immature Granulocytes: 0 %
Lymphocytes Absolute: 2 x10E3/uL (ref 0.7–3.1)
Lymphs: 25 %
MCH: 29.6 pg (ref 26.6–33.0)
MCHC: 32.3 g/dL (ref 31.5–35.7)
MCV: 92 fL (ref 79–97)
Monocytes Absolute: 0.7 x10E3/uL (ref 0.1–0.9)
Monocytes: 8 %
Neutrophils Absolute: 5 x10E3/uL (ref 1.4–7.0)
Neutrophils: 62 %
Platelets: 137 x10E3/uL — ABNORMAL LOW (ref 150–450)
RBC: 4.15 x10E6/uL (ref 3.77–5.28)
RDW: 13.1 % (ref 11.7–15.4)
WBC: 8 x10E3/uL (ref 3.4–10.8)

## 2024-06-19 LAB — CMP14+EGFR
ALT: 16 IU/L (ref 0–32)
AST: 19 IU/L (ref 0–40)
Albumin: 4.1 g/dL (ref 3.7–4.7)
Alkaline Phosphatase: 79 IU/L (ref 44–121)
BUN/Creatinine Ratio: 16 (ref 12–28)
BUN: 12 mg/dL (ref 8–27)
Bilirubin Total: 0.2 mg/dL (ref 0.0–1.2)
CO2: 23 mmol/L (ref 20–29)
Calcium: 9.2 mg/dL (ref 8.7–10.3)
Chloride: 104 mmol/L (ref 96–106)
Creatinine, Ser: 0.75 mg/dL (ref 0.57–1.00)
Globulin, Total: 2.3 g/dL (ref 1.5–4.5)
Glucose: 129 mg/dL — ABNORMAL HIGH (ref 70–99)
Potassium: 4.3 mmol/L (ref 3.5–5.2)
Sodium: 142 mmol/L (ref 134–144)
Total Protein: 6.4 g/dL (ref 6.0–8.5)
eGFR: 77 mL/min/1.73 (ref >59)

## 2024-06-19 LAB — TSH: TSH: 1.17 u[IU]/mL (ref 0.450–4.500)

## 2024-06-19 NOTE — Progress Notes (Signed)
 Hi Diana Parsons, platelets are little on the low end.  I would like to recheck that in about 4 to 6 weeks.  The rest of the blood count looks normal no sign of anemia.  Kidney and liver function is stable which is great.  Thyroid is normal.

## 2024-06-22 DIAGNOSIS — M8589 Other specified disorders of bone density and structure, multiple sites: Secondary | ICD-10-CM | POA: Diagnosis not present

## 2024-06-22 DIAGNOSIS — C50511 Malignant neoplasm of lower-outer quadrant of right female breast: Secondary | ICD-10-CM | POA: Diagnosis not present

## 2024-06-22 DIAGNOSIS — Z79810 Long term (current) use of selective estrogen receptor modulators (SERMs): Secondary | ICD-10-CM | POA: Diagnosis not present

## 2024-06-22 DIAGNOSIS — C50512 Malignant neoplasm of lower-outer quadrant of left female breast: Secondary | ICD-10-CM | POA: Diagnosis not present

## 2024-06-22 DIAGNOSIS — Z170 Estrogen receptor positive status [ER+]: Secondary | ICD-10-CM | POA: Diagnosis not present

## 2024-06-22 DIAGNOSIS — D696 Thrombocytopenia, unspecified: Secondary | ICD-10-CM | POA: Diagnosis not present

## 2024-06-30 ENCOUNTER — Ambulatory Visit (INDEPENDENT_AMBULATORY_CARE_PROVIDER_SITE_OTHER): Admitting: Family Medicine

## 2024-06-30 ENCOUNTER — Encounter: Payer: Self-pay | Admitting: Family Medicine

## 2024-06-30 VITALS — BP 120/62 | HR 102 | Ht 66.0 in | Wt 199.1 lb

## 2024-06-30 DIAGNOSIS — R09A2 Foreign body sensation, throat: Secondary | ICD-10-CM

## 2024-06-30 DIAGNOSIS — R809 Proteinuria, unspecified: Secondary | ICD-10-CM

## 2024-06-30 DIAGNOSIS — D696 Thrombocytopenia, unspecified: Secondary | ICD-10-CM

## 2024-06-30 DIAGNOSIS — E1129 Type 2 diabetes mellitus with other diabetic kidney complication: Secondary | ICD-10-CM | POA: Diagnosis not present

## 2024-06-30 MED ORDER — FLUTICASONE PROPIONATE 50 MCG/ACT NA SUSP: 2.0000 | Freq: Every day | NASAL | 0 refills | Status: AC

## 2024-06-30 NOTE — Patient Instructions (Signed)
-   Prescribe fluticasone  nasal spray, 2 sprays in each nostril daily for 1 week, then 1 spray daily. - Trial nasal spray for 3 weeks. - If no improvement, refer to ENT for further evaluation.

## 2024-06-30 NOTE — Progress Notes (Signed)
 Established Patient Office Visit  Subjective  Patient ID: Diana Parsons, female    DOB: [birth year]  Age: 89 y.o. MRN: [id number]  Chief Complaint  Patient presents with   Follow-up         HPI  Discussed the use of AI scribe software for clinical note transcription with the patient, who gave verbal consent to proceed.  History of Present Illness Diana Parsons is an 89 year old female who presents for a two-week follow-up.  Tachycardia - No current symptoms of palpitations or chest discomfort.  Constipation - Previously prescribed Linzess, but unable to continue due to cost and insurance issues. - Plans to return to Amitiza , which has provided some benefit in the past. - Received five more samples of Amitiza .  Thrombocytopenia - Platelet count two weeks ago was 137,000, decreased from baseline of 146,000 three months prior. - Oncologist expressed concern about downward trend and advised monitoring. - Other blood work, including kidney, liver, and thyroid function, was normal.  Pharyngeal sensation - Persistent sensation of something in the throat for the past couple of years. - Most noticeable upon waking and during the night. - Attempts to clear the sensation, but it recurs.  Upper respiratory symptoms - Frequent sneezing and mostly liquid nasal discharge. - Previously used nasal sprays such as Flonase , but none currently available at home.  Medication use - Currently using Ozempic  with an eight-week supply remaining. - will need renewal with patient assistance in October.    ROS    Objective:     BP 120/62   Pulse (!) 102   Ht 5' 6 (1.676 m)   Wt 199 lb 1.9 oz (90.3 kg)   SpO2 96%   BMI 32.14 kg/m     Physical Exam Vitals and nursing note reviewed.  Constitutional:      Appearance: Normal appearance.  HENT:     Head: Normocephalic and atraumatic.  Eyes:     Conjunctiva/sclera: Conjunctivae normal.  Cardiovascular:     Rate and Rhythm: Normal  rate and regular rhythm.  Pulmonary:     Effort: Pulmonary effort is normal.     Breath sounds: Normal breath sounds.  Skin:    General: Skin is warm and dry.  Neurological:     Mental Status: She is alert.  Psychiatric:        Mood and Affect: Mood normal.      No results found for any visits on 06/30/24.     The ASCVD Risk score (Arnett DK, et al., 2019) failed to calculate for the following reasons:   The 2019 ASCVD risk score is only valid for ages 63 to 72    Assessment & Plan:   Problem List Items Addressed This Visit       Endocrine   Microalbuminuria due to type 2 diabetes mellitus (HCC) - Primary   Relevant Orders   CBC with Differential/Platelet   Hemoglobin A1c   Urine Microalbumin w/creat. ratio     Hematopoietic and Hemostatic   Thrombocytopenia (HCC)   Relevant Orders   CBC with Differential/Platelet   Hemoglobin A1c   Other Visit Diagnoses       Globus sensation       Relevant Medications   fluticasone  (FLONASE ) 50 MCG/ACT nasal spray      Assessment and Plan Assessment & Plan Constipation Chronic constipation with suspected rectal outlet obstruction. Linzess unaffordable due to insurance. Amitiza  effective and covered. - Continue Amitiza  as it is covered by insurance  and was previously effective. - Consider insurance appeal for Linzess if Amitiza  is not effective.  Thrombocytopenia Platelet count decreased to 137,000. Oncologist advised monitoring due to downward trend. No immediate symptoms or complications. - Order repeat platelet count in 2 weeks to monitor trend. - Send results to oncologist for review.  Type 2 diabetes mellitus Managed with Ozempic . Due for A1c check. Reauthorization needed in October. - Perform A1c test today via finger stick. - Monitor for reauthorization of Ozempic  in October.  Chronic globus sensation Persistent throat sensation. Possible causes include drainage, dryness, or reflux. - Prescribe fluticasone   nasal spray, 2 sprays in each nostril daily for 1 week, then 1 spray daily. - Trial nasal spray for 3 weeks. - If no improvement, refer to ENT for further evaluation.  Tachycardia (resolved) No current symptoms. Previously discussed potential need for heart monitor if symptoms persisted.   Return in about 4 months (around 10/30/2024) for Diabetes follow-up.    Dorothyann Byars, MD

## 2024-07-01 LAB — MICROALBUMIN / CREATININE URINE RATIO
Creatinine, Urine: 251.5 mg/dL
Microalb/Creat Ratio: 23 mg/g{creat} (ref 0–29)
Microalbumin, Urine: 57.3 ug/mL

## 2024-07-13 DIAGNOSIS — M170 Bilateral primary osteoarthritis of knee: Secondary | ICD-10-CM | POA: Diagnosis not present

## 2024-07-15 DIAGNOSIS — E119 Type 2 diabetes mellitus without complications: Secondary | ICD-10-CM | POA: Diagnosis not present

## 2024-07-16 DIAGNOSIS — R809 Proteinuria, unspecified: Secondary | ICD-10-CM | POA: Diagnosis not present

## 2024-07-16 DIAGNOSIS — D696 Thrombocytopenia, unspecified: Secondary | ICD-10-CM | POA: Diagnosis not present

## 2024-07-16 DIAGNOSIS — E1129 Type 2 diabetes mellitus with other diabetic kidney complication: Secondary | ICD-10-CM | POA: Diagnosis not present

## 2024-07-17 ENCOUNTER — Ambulatory Visit: Payer: Self-pay | Admitting: Family Medicine

## 2024-07-17 LAB — CBC WITH DIFFERENTIAL/PLATELET
Basophils Absolute: 0.1 x10E3/uL (ref 0.0–0.2)
Basos: 1 %
EOS (ABSOLUTE): 0.2 x10E3/uL (ref 0.0–0.4)
Eos: 2 %
Hematocrit: 38.4 % (ref 34.0–46.6)
Hemoglobin: 12.4 g/dL (ref 11.1–15.9)
Immature Grans (Abs): 0 x10E3/uL (ref 0.0–0.1)
Immature Granulocytes: 0 %
Lymphocytes Absolute: 1.8 x10E3/uL (ref 0.7–3.1)
Lymphs: 23 %
MCH: 30.2 pg (ref 26.6–33.0)
MCHC: 32.3 g/dL (ref 31.5–35.7)
MCV: 94 fL (ref 79–97)
Monocytes Absolute: 0.6 x10E3/uL (ref 0.1–0.9)
Monocytes: 7 %
Neutrophils Absolute: 5.4 x10E3/uL (ref 1.4–7.0)
Neutrophils: 67 %
Platelets: 161 x10E3/uL (ref 150–450)
RBC: 4.1 x10E6/uL (ref 3.77–5.28)
RDW: 13.1 % (ref 11.7–15.4)
WBC: 8.1 x10E3/uL (ref 3.4–10.8)

## 2024-07-17 LAB — HEMOGLOBIN A1C
Est. average glucose Bld gHb Est-mCnc: 151 mg/dL
Hgb A1c MFr Bld: 6.9 % — ABNORMAL HIGH (ref 4.8–5.6)

## 2024-07-17 NOTE — Progress Notes (Signed)
 Hi Monike, A1c is 6.9 today which is up from 6.3 last time.  Just really encouraged her to continue to work on healthy food choices and staying active.  I barely do not want to go above 7 if we can help it.  Blood count looks good.

## 2024-07-21 ENCOUNTER — Other Ambulatory Visit: Payer: Self-pay | Admitting: Physician Assistant

## 2024-07-21 DIAGNOSIS — K5909 Other constipation: Secondary | ICD-10-CM

## 2024-07-24 ENCOUNTER — Telehealth: Payer: Self-pay

## 2024-07-24 DIAGNOSIS — K581 Irritable bowel syndrome with constipation: Secondary | ICD-10-CM | POA: Diagnosis not present

## 2024-07-24 DIAGNOSIS — K602 Anal fissure, unspecified: Secondary | ICD-10-CM | POA: Diagnosis not present

## 2024-07-24 NOTE — Progress Notes (Signed)
   07/24/2024  Patient ID: Ronal VEAR Cabot, female   DOB: 12-Jun-1934, 89 y.o.   MRN: [id number]  Returning missed call/voicemail from patient stating she is running low on Ozempic  2mg .  Patient receives this medication through [facility] and is down to 5 doses remaining.  Coordinating with medication assistance team and PCP to submit a reorder form for another 4 month supply through [facility] [facility].  Advised patient that medication will no longer be available through this program starting in January.  She has an appointment next week to look at [facility] plans and will make her advisor aware of this.  Channing DELENA Mealing, PharmD, DPLA

## 2024-07-27 ENCOUNTER — Telehealth: Payer: Self-pay

## 2024-07-27 NOTE — Telephone Encounter (Signed)
 Completed refill order form for Ozempic  and faxed to provider's office for review and signature.

## 2024-07-30 DIAGNOSIS — H35363 Drusen (degenerative) of macula, bilateral: Secondary | ICD-10-CM | POA: Diagnosis not present

## 2024-07-30 DIAGNOSIS — H353231 Exudative age-related macular degeneration, bilateral, with active choroidal neovascularization: Secondary | ICD-10-CM | POA: Diagnosis not present

## 2024-07-30 DIAGNOSIS — E113293 Type 2 diabetes mellitus with mild nonproliferative diabetic retinopathy without macular edema, bilateral: Secondary | ICD-10-CM | POA: Diagnosis not present

## 2024-07-31 DIAGNOSIS — M48061 Spinal stenosis, lumbar region without neurogenic claudication: Secondary | ICD-10-CM | POA: Diagnosis not present

## 2024-08-03 DIAGNOSIS — M170 Bilateral primary osteoarthritis of knee: Secondary | ICD-10-CM | POA: Diagnosis not present

## 2024-08-06 ENCOUNTER — Other Ambulatory Visit: Payer: Self-pay

## 2024-08-06 ENCOUNTER — Encounter: Payer: Self-pay | Admitting: Rehabilitative and Restorative Service Providers"

## 2024-08-06 ENCOUNTER — Ambulatory Visit: Attending: Family Medicine | Admitting: Rehabilitative and Restorative Service Providers"

## 2024-08-06 DIAGNOSIS — M6281 Muscle weakness (generalized): Secondary | ICD-10-CM | POA: Insufficient documentation

## 2024-08-06 DIAGNOSIS — M5459 Other low back pain: Secondary | ICD-10-CM | POA: Diagnosis not present

## 2024-08-06 NOTE — Therapy (Signed)
 OUTPATIENT PHYSICAL THERAPY THORACOLUMBAR EVALUATION   Patient Name: Diana Parsons MRN: [id number] DOB:30-Nov-1933, 89 y.o., female Today's Date: 08/06/2024  END OF SESSION:  PT End of Session - 08/06/24 1307     Visit Number 1    Number of Visits 16    Date for Recertification  10/05/24    Authorization Type blue medicare    PT Start Time 1315    PT Stop Time 1400    PT Time Calculation (min) 45 min    Activity Tolerance Patient tolerated treatment well    Behavior During Therapy WFL for tasks assessed/performed          Past Medical History:  Diagnosis Date   Arthritis    Cancer (HCC)    Cataract    Diabetes mellitus without complication (HCC)    GERD (gastroesophageal reflux disease)    Past Surgical History:  Procedure Laterality Date   ABDOMINAL HYSTERECTOMY  89 yrs old   BREAST BIOPSY Right 09/23/2023   US  RT BREAST BX W LOC DEV 1ST LESION IMG BX SPEC US  GUIDE 09/23/2023 [facility]   BREAST BIOPSY Left 09/23/2023   US  LT BREAST BX W LOC DEV 1ST LESION IMG BX SPEC US  GUIDE 09/23/2023 [facility]   EYE SURGERY  Cataracts august 2021   left shoulder sx s/p fall  10/16/1999   has pin in place   lumbarp spine surgery  07/15/2014   Dr. Melchor   SPINE SURGERY     Patient Active Problem List   Diagnosis Date Noted   Thrombocytopenia 06/19/2024   Residual hemorrhoidal skin tags 06/01/2024   Malignant neoplasm of lower-outer quadrant of female breast (HCC) 10/14/2023   Severe obesity (BMI 35.0-35.9 with comorbidity) (HCC) 05/13/2023   Macular degeneration 06/07/2022   Primary osteoarthritis of both first carpometacarpal joints 06/05/2022   Osteopenia 03/07/2022   Chronic constipation 10/05/2021   Edema of right foot 03/06/2021   Combined forms of age-related cataract of left eye 06/02/2020   Aortic valve sclerosis 11/23/2019   Family history of lung cancer 11/09/2019   Orthopnea 11/09/2019   PND (paroxysmal nocturnal dyspnea) 11/09/2019    Esophagitis 11/09/2019   Microalbuminuria due to type 2 diabetes mellitus (HCC) 07/20/2016   Lumbar degenerative disc disease 05/03/2014   Status post lumbar surgery 09/03/2013   Spinal stenosis of lumbar region without neurogenic claudication 08/30/2013   Tear of meniscus of knee 05/29/2013   Primary osteoarthritis of left knee 05/28/2012   Controlled diabetes mellitus type 2 with complications (HCC) 02/19/2007   HYPERCHOLESTEROLEMIA, PURE 02/03/2007   BREAST MASS, LEFT 02/03/2007   POSTMENOPAUSAL STATUS 02/03/2007    PCP: Alvan Craven, MD REFERRING PROVIDER: Durwin Christ, NP  REFERRING DIAG:  [phone] (ICD-10-CM) - Spinal stenosis, lumbar region without neurogenic claudication  M51.362 (ICD-10-CM) - Other intervertebral disc degeneration, lumbar region with discogenic back pain and lower extremity pain    Rationale for Evaluation and Treatment: Rehabilitation  THERAPY DIAG:  Other low back pain  ONSET DATE: 05/03/2024  SUBJECTIVE:  SUBJECTIVE STATEMENT: The patient reports worsening back pain since July 2025. She reported she awoke one morning with severe pain and could not move well. She reports that she had carried 2 cases of water into her home + groceries and felt pain begin within a couple of weeks. The epidural has reduced pain, but she still feels weak. She is using her RW in the community and no device in the house.   PERTINENT HISTORY:  Arthritis-- got knee injections bilat 3 weeks ago, arthritis, DM, h/o cancer (breast cancer)  PAIN:  Are you having pain? Yes: NPRS scale: 0/10 at rest  Pain location: low back  Pain description: achiness all over Aggravating factors: standing longer periods Relieving factors: resting, recliner  PRECAUTIONS: Fall  WEIGHT BEARING RESTRICTIONS:  No  FALLS:  Has patient fallen in last 6 months? No  LIVING ENVIRONMENT: Lives with: lives alone Lives in: House/apartment Stairs: No Has following equipment at home: Vannie - 2 wheeled and shower chair  OCCUPATION: retired  PLOF: Independent  PATIENT GOALS: Less pain, feel more comfortable maneuvering, wants to know what exercises to do  OBJECTIVE:  Note: Objective measures were completed at Evaluation unless otherwise noted.  DIAGNOSTIC FINDINGS:  MRI 05/03/24 IMPRESSION: 1. Left lateral recess protrusion with cephalad extension at L4-5 impinges on the descending left L4 root. 2. Mild to moderate central canal stenosis and right worse than left lateral recess narrowing at L3-4. Moderate to moderately severe bilateral foraminal narrowing is also present at this level. 3. Moderate right lateral recess narrowing at L2-3. 4. Mild to moderate left foraminal narrowing at L5-S1. Caudally extending disc on the right at L5-S1 contacts the right S1 root without compression or displacement.  PATIENT SURVEYS:  Modified Oswestry: 40%  COGNITION: Overall cognitive status: Within functional limits for tasks assessed     SENSATION: WFL  MUSCLE LENGTH: Hamstrings: Right -45 deg; Left -45 deg with pain in hamstring region  POSTURE: rounded shoulders, forward head, and posterior pelvic tilt  PALPATION: Edema noted over L scapula and rhomboid region-- noted with spinal ROM-- L side more pronounced  LUMBAR ROM:  AROM eval  Flexion 50% limitation  Extension 50% limitation  Right lateral flexion 50% limited With pain glut med region  Left lateral flexion 50% limited with pain glud med region  Right rotation   Left rotation    (Blank rows = not tested)  LOWER EXTREMITY ROM:   WFLs, some limitation to extend knee due to tightness in HS and pain in low back reproduced  LOWER EXTREMITY MMT:  gets a sensation the knees can give out when walking MMT Right eval Left eval  Hip  flexion 5/5 4-/5  Hip extension    Hip abduction    Hip adduction    Hip internal rotation    Hip external rotation    Knee flexion 4/5 4/5  Knee extension 4/5 4/5  Ankle dorsiflexion    Ankle plantarflexion    Ankle inversion    Ankle eversion     (Blank rows = not tested)  FUNCTIONAL TESTS:  Performed sit<.stand 3 reps with UE pressing into thighs  GAIT: Distance walked: 100 ft Assistive device utilized: Walker - 2 wheeled Level of assistance: Modified independence Comments: walks in her home without device for short distances Gait speed=2.38 ft/sec  [facility] Adult PT Treatment:  DATE: 08/06/24 Therapeutic Exercise: Hamstring stretch seated flares R low back pain Standing gastroc stretch Supine Marching x 10 reps SLR Sit<>stand with UE assist                                                                                                                   PATIENT EDUCATION:  Education details: HEP Person educated: Patient Education method: Explanation, Demonstration, and Handouts Education comprehension: verbalized understanding, returned demonstration, and needs further education  HOME EXERCISE PROGRAM: Access Code: [email or web address] URL: [email or web address] Date: 08/06/2024 Prepared by: Tawni Ferrier  Exercises - Supine March  - 1 x daily - 5 x weekly - 1 sets - 10 reps - Supine Active Straight Leg Raise  - 1 x daily - 5 x weekly - 1 sets - 10 reps - Sit to Stand with Counter Support  - 2 x daily - 7 x weekly - 1 sets - 3-5 reps - Gastroc Stretch on Wall  - 1 x daily - 5 x weekly - 1 sets - 2 reps - 20 seconds hold - Heel Raises with Counter Support  - 1 x daily - 5 x weekly - 1 sets - 10 reps  ASSESSMENT:  CLINICAL IMPRESSION: Patient is an 89 y.o. female who was seen today for physical therapy evaluation and treatment for spinal stenosis and back pain. She presents with LE weakness, decreased flexibility  in hamstrings, decreased flexibility in gastrocs, decreased lumbar AROM, and pain with activities. The patient also has bilat knee arthritis with some h/o knees giving way sensation. PT to address deficits to promote improvement in functional mobility and reduced pain.   OBJECTIVE IMPAIRMENTS: decreased balance, decreased ROM, decreased strength, hypomobility, impaired flexibility, and pain.   ACTIVITY LIMITATIONS: lifting, bending, sitting, standing, and locomotion level  PARTICIPATION LIMITATIONS: cleaning, shopping, and community activity  PERSONAL FACTORS: 3+ comorbidities: chronic LBP, bilat knee arthritis, DM are also affecting patient's functional outcome.   REHAB POTENTIAL: Good  CLINICAL DECISION MAKING: Evolving/moderate complexity  EVALUATION COMPLEXITY: Moderate  GOALS: Goals reviewed with patient? Yes  SHORT TERM GOALS: Target date: 09/05/24  The patient will be indep with initial HEP Baseline: initiated at eval Goal status: INITIAL  2.  The patient will perform sit<>stand x 10 reps to demo increasing functional strength.  Baseline:  Began at 3 reps-- patient noted requiring multiple attempts to rise from couch/low seat Goal status: INITIAL  3.  The patient will improve hip flexor strength to 5/5. Baseline:  4/5 Goal status: INITIAL  LONG TERM GOALS: Target date: 10/05/24  The patient will be indep with progression of HEP. Baseline:  initiated at eval Goal status: INITIAL  2.  The patient will improve modified oswestry by 12%  to demonstrate improved functional abilities. Baseline: 40% Goal status: INITIAL  3.  The patient will demo improved hamstring length to -30 from full extension bilat. Baseline:  -45 degrees-- tightness noted Goal status: INITIAL  4.  The patient will perform lateral leaning without pain to R and L sides.  Baseline:  pain with lumbar lateral lean R and L Goal status: INITIAL  5.  The patient will report ability to stand x 30+  minutes without increased low back pain. Baseline:  Notes standing is limited Goal status: INITIAL  PLAN:  PT FREQUENCY: 2x/week  PT DURATION: 8 weeks  PLANNED INTERVENTIONS: 97164- PT Re-evaluation, 97750- Physical Performance Testing, 97110-Therapeutic exercises, 97530- Therapeutic activity, [id number]- Neuromuscular re-education, 97535- Self Care, [id number]- Manual therapy, [id number]- Gait training, [phone]- Electrical stimulation (unattended), 20560 (1-2 muscles), 20561 (3+ muscles)- Dry Needling, Patient/Family education, Balance training, and Moist heat.  PLAN FOR NEXT SESSION: check HEP, work on gentle mobility for low back, core activation, hamstring/gastroc lengthening hip and knee strengthening.  Areonna Bran, PT 08/06/2024, 1:09 PM

## 2024-08-07 ENCOUNTER — Telehealth: Payer: Self-pay

## 2024-08-07 NOTE — Telephone Encounter (Signed)
 Patient scheduled an appointment for 10:50 on 08/12/2024.

## 2024-08-07 NOTE — Telephone Encounter (Signed)
 Ccall pt and see if can put her in acute next week for spot on her back

## 2024-08-07 NOTE — Telephone Encounter (Signed)
-----   Message from [facility] sent at 08/06/2024  4:18 PM EDT ----- Hi Dr. Alvan, I evaluated Diana Parsons today for low back pain (referred by Deidre Alice, NP). I noticed edema around her L upper back and scapular region. She doesn't have pain with palpation, but has also noticed recent swelling in her upper back only on the L side. I asked her about her next visit with you and it is not until January.  I plan to check this finding again at an upcoming appointment and have her see you if it continues. I wanted to reach out in case you felt it needed attention sooner. Thank you, Tawni

## 2024-08-07 NOTE — Telephone Encounter (Signed)
 Received from  patient assistance  Ozempic  8mg /51ml 3 boxes  ( one pen per box)  Called patient. Left a voice mail message requesting a return call

## 2024-08-10 ENCOUNTER — Ambulatory Visit (INDEPENDENT_AMBULATORY_CARE_PROVIDER_SITE_OTHER)

## 2024-08-10 ENCOUNTER — Ambulatory Visit: Admitting: Family Medicine

## 2024-08-10 DIAGNOSIS — M6281 Muscle weakness (generalized): Secondary | ICD-10-CM

## 2024-08-10 DIAGNOSIS — M5459 Other low back pain: Secondary | ICD-10-CM

## 2024-08-10 NOTE — Therapy (Signed)
 OUTPATIENT PHYSICAL THERAPY THORACOLUMBAR TREATMENT   Patient Name: Diana Parsons MRN: [id number] DOB:January 13, 1934, 89 y.o., female Today's Date: 08/10/2024  END OF SESSION:  PT End of Session - 08/10/24 1401     Visit Number 2    Number of Visits 16    Date for Recertification  10/05/24    Authorization Type blue medicare    Progress Note Due on Visit 10    PT Start Time 1402    PT Stop Time 1445    PT Time Calculation (min) 43 min    Activity Tolerance Patient tolerated treatment well    Behavior During Therapy WFL for tasks assessed/performed         Past Medical History:  Diagnosis Date   Arthritis    Cancer (HCC)    Cataract    Diabetes mellitus without complication (HCC)    GERD (gastroesophageal reflux disease)    Past Surgical History:  Procedure Laterality Date   ABDOMINAL HYSTERECTOMY  89 yrs old   BREAST BIOPSY Right 09/23/2023   US  RT BREAST BX W LOC DEV 1ST LESION IMG BX SPEC US  GUIDE 09/23/2023 [facility]   BREAST BIOPSY Left 09/23/2023   US  LT BREAST BX W LOC DEV 1ST LESION IMG BX SPEC US  GUIDE 09/23/2023 [facility]   EYE SURGERY  Cataracts august 2021   left shoulder sx s/p fall  10/16/1999   has pin in place   lumbarp spine surgery  07/15/2014   Dr. Melchor   SPINE SURGERY     Patient Active Problem List   Diagnosis Date Noted   Thrombocytopenia 06/19/2024   Residual hemorrhoidal skin tags 06/01/2024   Malignant neoplasm of lower-outer quadrant of female breast (HCC) 10/14/2023   Severe obesity (BMI 35.0-35.9 with comorbidity) (HCC) 05/13/2023   Macular degeneration 06/07/2022   Primary osteoarthritis of both first carpometacarpal joints 06/05/2022   Osteopenia 03/07/2022   Chronic constipation 10/05/2021   Edema of right foot 03/06/2021   Combined forms of age-related cataract of left eye 06/02/2020   Aortic valve sclerosis 11/23/2019   Family history of lung cancer 11/09/2019   Orthopnea 11/09/2019   PND (paroxysmal nocturnal  dyspnea) 11/09/2019   Esophagitis 11/09/2019   Microalbuminuria due to type 2 diabetes mellitus (HCC) 07/20/2016   Lumbar degenerative disc disease 05/03/2014   Status post lumbar surgery 09/03/2013   Spinal stenosis of lumbar region without neurogenic claudication 08/30/2013   Tear of meniscus of knee 05/29/2013   Primary osteoarthritis of left knee 05/28/2012   Controlled diabetes mellitus type 2 with complications (HCC) 02/19/2007   HYPERCHOLESTEROLEMIA, PURE 02/03/2007   BREAST MASS, LEFT 02/03/2007   POSTMENOPAUSAL STATUS 02/03/2007    PCP: Alvan Craven, MD REFERRING PROVIDER: Durwin Christ, NP  REFERRING DIAG:  [phone] (ICD-10-CM) - Spinal stenosis, lumbar region without neurogenic claudication  M51.362 (ICD-10-CM) - Other intervertebral disc degeneration, lumbar region with discogenic back pain and lower extremity pain    Rationale for Evaluation and Treatment: Rehabilitation  THERAPY DIAG:  Other low back pain  Muscle weakness (generalized)  ONSET DATE: 05/03/2024  SUBJECTIVE:  SUBJECTIVE STATEMENT: Patient reports she has no pain currently but can get sharp pain on Rt side of low back that radiates around/down hip.   PERTINENT HISTORY:  Arthritis-- got knee injections bilat 3 weeks ago, arthritis, DM, h/o cancer (breast cancer)  PAIN:  Are you having pain? Yes: NPRS scale: 0/10 at rest  Pain location: low back  Pain description: achiness all over Aggravating factors: standing longer periods Relieving factors: resting, recliner  PRECAUTIONS: Fall  WEIGHT BEARING RESTRICTIONS: No  FALLS:  Has patient fallen in last 6 months? No  LIVING ENVIRONMENT: Lives with: lives alone Lives in: House/apartment Stairs: No Has following equipment at home: Vannie - 2 wheeled and shower  chair  OCCUPATION: retired  PLOF: Independent  PATIENT GOALS: Less pain, feel more comfortable maneuvering, wants to know what exercises to do  OBJECTIVE:  Note: Objective measures were completed at Evaluation unless otherwise noted.  DIAGNOSTIC FINDINGS:  MRI 05/03/24 IMPRESSION: 1. Left lateral recess protrusion with cephalad extension at L4-5 impinges on the descending left L4 root. 2. Mild to moderate central canal stenosis and right worse than left lateral recess narrowing at L3-4. Moderate to moderately severe bilateral foraminal narrowing is also present at this level. 3. Moderate right lateral recess narrowing at L2-3. 4. Mild to moderate left foraminal narrowing at L5-S1. Caudally extending disc on the right at L5-S1 contacts the right S1 root without compression or displacement.  PATIENT SURVEYS:  Modified Oswestry: 40%  COGNITION: Overall cognitive status: Within functional limits for tasks assessed     SENSATION: WFL  MUSCLE LENGTH: Hamstrings: Right -45 deg; Left -45 deg with pain in hamstring region  POSTURE: rounded shoulders, forward head, and posterior pelvic tilt  PALPATION: Edema noted over L scapula and rhomboid region-- noted with spinal ROM-- L side more pronounced  LUMBAR ROM:  AROM eval  Flexion 50% limitation  Extension 50% limitation  Right lateral flexion 50% limited With pain glut med region  Left lateral flexion 50% limited with pain glud med region  Right rotation   Left rotation    (Blank rows = not tested)  LOWER EXTREMITY ROM:   WFLs, some limitation to extend knee due to tightness in HS and pain in low back reproduced  LOWER EXTREMITY MMT:  gets a sensation the knees can give out when walking MMT Right eval Left eval  Hip flexion 5/5 4-/5  Hip extension    Hip abduction    Hip adduction    Hip internal rotation    Hip external rotation    Knee flexion 4/5 4/5  Knee extension 4/5 4/5  Ankle dorsiflexion    Ankle  plantarflexion    Ankle inversion    Ankle eversion     (Blank rows = not tested)  FUNCTIONAL TESTS:  Performed sit<.stand 3 reps with UE pressing into thighs  GAIT: Distance walked: 100 ft Assistive device utilized: Environmental [employer]ant - 2 wheeled Level of assistance: Modified independence Comments: walks in her home without device for short distances Gait speed=2.38 ft/sec   [facility] Adult PT Treatment:                                                DATE: 08/10/2024 Neuromuscular re-ed: Hooklying hip add isometric ball squeeze --> added PF activation Supine marching + PF activation  Bridges + PF activation --> Rt HS cramped Hooklying  hip abd isometric press + green TB Bridges + hip abd with green TB --> Rt HS cramped Standing marching --> added yellow TB around ankle Therapeutic Activity: Supine marching --> painful on Rt lumbar Hooklying dynamic HS stretch --> added ankle pump for sciatic nerve glide Sit to stand with hands braced on thighs --> gradually decreasing weight through hands Seated forward & diagonal trunk flexion stretch roll out with red PB    [facility] Adult PT Treatment:                                                DATE: 08/06/24 Therapeutic Exercise: Hamstring stretch seated flares R low back pain Standing gastroc stretch Supine Marching x 10 reps SLR Sit<>stand with UE assist                                                                                                                   PATIENT EDUCATION:  Education details: HEP Person educated: Patient Education method: Programmer, Multimedia, Demonstration, and Handouts Education comprehension: verbalized understanding, returned demonstration, and needs further education  HOME EXERCISE PROGRAM: Access Code: [email or web address] URL: [email or web address] Date: 08/06/2024 Prepared by: Tawni Ferrier  Exercises - Supine March  - 1 x daily - 5 x weekly - 1 sets - 10 reps - Supine Active Straight Leg Raise  - 1 x daily  - 5 x weekly - 1 sets - 10 reps - Sit to Stand with Counter Support  - 2 x daily - 7 x weekly - 1 sets - 3-5 reps - Gastroc Stretch on Wall  - 1 x daily - 5 x weekly - 1 sets - 2 reps - 20 seconds hold - Heel Raises with Counter Support  - 1 x daily - 5 x weekly - 1 sets - 10 reps  ASSESSMENT:  CLINICAL IMPRESSION: Attempted progression to supine bridges, however limited due to hamstring cramping on Rt LE; dynamic hamstring stretch decreased symptoms on Rt LE, however Lt LE began cramping (exercise discontinued). Cueing pelvic floor activation alleviated Rt lumbar/hip symptoms with supine and standing marching variations. Patient tolerated sit to stand with hands braced on thighs, gradually decreasing weight bearing through arms with repetition.   EVAL: Patient is an 89 y.o. female who was seen today for physical therapy evaluation and treatment for spinal stenosis and back pain. She presents with LE weakness, decreased flexibility in hamstrings, decreased flexibility in gastrocs, decreased lumbar AROM, and pain with activities. The patient also has bilat knee arthritis with some h/o knees giving way sensation. PT to address deficits to promote improvement in functional mobility and reduced pain.   OBJECTIVE IMPAIRMENTS: decreased balance, decreased ROM, decreased strength, hypomobility, impaired flexibility, and pain.   ACTIVITY LIMITATIONS: lifting, bending, sitting, standing, and locomotion level  PARTICIPATION LIMITATIONS: cleaning, shopping, and community activity  PERSONAL FACTORS: 3+ comorbidities: chronic LBP, bilat knee arthritis, DM are also  affecting patient's functional outcome.   REHAB POTENTIAL: Good  CLINICAL DECISION MAKING: Evolving/moderate complexity  EVALUATION COMPLEXITY: Moderate  GOALS: Goals reviewed with patient? Yes  SHORT TERM GOALS: Target date: 09/05/24  The patient will be indep with initial HEP Baseline: initiated at eval Goal status: INITIAL  2.  The  patient will perform sit<>stand x 10 reps to demo increasing functional strength.  Baseline:  Began at 3 reps-- patient noted requiring multiple attempts to rise from couch/low seat Goal status: INITIAL  3.  The patient will improve hip flexor strength to 5/5. Baseline:  4/5 Goal status: INITIAL  LONG TERM GOALS: Target date: 10/05/24  The patient will be indep with progression of HEP. Baseline:  initiated at eval Goal status: INITIAL  2.  The patient will improve modified oswestry by 12%  to demonstrate improved functional abilities. Baseline: 40% Goal status: INITIAL  3.  The patient will demo improved hamstring length to -30 from full extension bilat. Baseline:  -45 degrees-- tightness noted Goal status: INITIAL  4.  The patient will perform lateral leaning without pain to R and L sides. Baseline:  pain with lumbar lateral lean R and L Goal status: INITIAL  5.  The patient will report ability to stand x 30+ minutes without increased low back pain. Baseline:  Notes standing is limited Goal status: INITIAL  PLAN:  PT FREQUENCY: 2x/week  PT DURATION: 8 weeks  PLANNED INTERVENTIONS: 97164- PT Re-evaluation, 97750- Physical Performance Testing, 97110-Therapeutic exercises, 97530- Therapeutic activity, [id number]- Neuromuscular re-education, 97535- Self Care, 02859- Manual therapy, [id number]- Gait training, [phone]- Electrical stimulation (unattended), 20560 (1-2 muscles), 20561 (3+ muscles)- Dry Needling, Patient/Family education, Balance training, and Moist heat.  PLAN FOR NEXT SESSION: progress HEP prn, work on gentle mobility for low back, core activation, hamstring/gastroc lengthening hip and knee strengthening.  Lamarr GORMAN Price, PTA 08/10/2024, 2:48 PM

## 2024-08-12 ENCOUNTER — Encounter: Payer: Self-pay | Admitting: Family Medicine

## 2024-08-12 ENCOUNTER — Ambulatory Visit: Admitting: Family Medicine

## 2024-08-12 VITALS — BP 126/54 | HR 107 | Ht 66.0 in | Wt 202.3 lb

## 2024-08-12 DIAGNOSIS — K5909 Other constipation: Secondary | ICD-10-CM | POA: Diagnosis not present

## 2024-08-12 DIAGNOSIS — R222 Localized swelling, mass and lump, trunk: Secondary | ICD-10-CM | POA: Diagnosis not present

## 2024-08-12 DIAGNOSIS — R0982 Postnasal drip: Secondary | ICD-10-CM

## 2024-08-12 DIAGNOSIS — R110 Nausea: Secondary | ICD-10-CM | POA: Diagnosis not present

## 2024-08-12 DIAGNOSIS — E118 Type 2 diabetes mellitus with unspecified complications: Secondary | ICD-10-CM

## 2024-08-12 DIAGNOSIS — R630 Anorexia: Secondary | ICD-10-CM

## 2024-08-12 NOTE — Progress Notes (Signed)
 Acute Office Visit  Patient ID: Diana Parsons, female    DOB: 22-Mar-1934, 89 y.o.   MRN: [id number]  PCP: Alvan Diana BIRCH, MD  No chief complaint on file.   Subjective:     HPI  Discussed the use of AI scribe software for clinical note transcription with the patient, who gave verbal consent to proceed.  History of Present Illness Diana Parsons is an 89 year old female with a history of bilateral breast cancer who presents with a lump on her back.  Subcutaneous back mass - Lump on back noticed a few weeks ago - Initially painless, now sore - First noticed by PT due to visibility through tight clothing - Uncertain if the lump has changed in size, but feels more prominent - Lives alone and typically wears loose clothing, possibly delaying detection  Breast mass and sensory changes - History of bilateral breast cancer with prior surgeries, including lumpectomy - Intermittent sensation in left breast described as 'somebody's pulling my nipple in,' lasting a few seconds - Persistent lump in the lower part of the breast present for a long time - Sonograms and mammograms have consistently been normal  Chronic throat clearing - Ongoing throat issues, particularly at night - Persistent sensation of needing to clear throat - Partial relief with Flonase , but symptoms persist  Diabetes mellitus and medication side effects - On Ozempic  for diabetes management - Persistent nausea and decreased appetite affecting oral intake - Uses Miralax for constipation, attributed to medication - Average blood glucose level is 139 mg/dL - Concerned about the cost of Ozempic  after insurance changes   ROS     Objective:    BP (!) 126/54   Pulse (!) 107   Ht 5' 6 (1.676 m)   Wt 202 lb 4.8 oz (91.8 kg)   SpO2 98%   BMI 32.65 kg/m    Physical Exam Vitals reviewed.  Constitutional:      Appearance: Normal appearance.  HENT:     Head: Normocephalic.  Pulmonary:     Effort:  Pulmonary effort is normal.  Chest:       Comments: Red line is her old scare from lumpectomy no erythema or lump under scar.  Musculoskeletal:     Comments: On her left upper back she has a large soft mass.  No edema or erythema on the surrounding skin no increased warmth.  Just slightly tender more towards the base of the area but not over the entire lesion.  It is just near the left scapula.  Neurological:     Mental Status: She is alert and oriented to person, place, and time.  Psychiatric:        Mood and Affect: Mood normal.        Behavior: Behavior normal.       No results found for any visits on 08/12/24.     Assessment & Plan:   Problem List Items Addressed This Visit       Digestive   Chronic constipation     Endocrine   Controlled diabetes mellitus type 2 with complications (HCC)   Other Visit Diagnoses       Mass of subcutaneous tissue of back    -  Primary   Relevant Orders   MR CHEST WO CONTRAST     Nausea         Decreased appetite         Post-nasal drip  Assessment and Plan Assessment & Plan Lump on back Lump on back with differential diagnosis of lipoma or sebaceous cyst. Rapid appearance warrants further evaluation. - Order CT scan to evaluate lump characteristics.  Type 2 diabetes mellitus Type 2 diabetes managed with Ozempic . A1c at 6.9, average glucose 139 mg/dL. Nausea possibly related to medication or constipation. Considering insurance change for better coverage. - Continue Ozempic , consider insurance switch for coverage. - Monitor blood glucose regularly. - Consider reducing Ozempic  dose if nausea persists after addressing constipation.  Nausea and decreased appetite Nausea and decreased appetite potentially related to Ozempic  or constipation, affecting eating habits. - Ensure regular bowel movements with Miralax. - Consider reducing Ozempic  dose if nausea persists after addressing constipation. - Encourage small, frequent  meals with protein.  Constipation Constipation may contribute to nausea. Regular bowel movements needed. - Increase Miralax dosage temporarily. - Stay hydrated.  Chronic throat drainage Chronic throat drainage bothersome at night. Partial relief with Flonase . - Use nasal saline rinse before bed.    No orders of the defined types were placed in this encounter.   Return if symptoms worsen or fail to improve.  Diana Byars, MD [facility] at [facility]

## 2024-08-12 NOTE — Telephone Encounter (Signed)
 Patient seen in office today at 10:10 with Dr. Alvan.

## 2024-08-13 ENCOUNTER — Ambulatory Visit: Admitting: Rehabilitative and Restorative Service Providers"

## 2024-08-13 ENCOUNTER — Encounter: Payer: Self-pay | Admitting: Rehabilitative and Restorative Service Providers"

## 2024-08-13 DIAGNOSIS — M5459 Other low back pain: Secondary | ICD-10-CM

## 2024-08-13 DIAGNOSIS — M6281 Muscle weakness (generalized): Secondary | ICD-10-CM | POA: Diagnosis not present

## 2024-08-13 NOTE — Therapy (Signed)
 OUTPATIENT PHYSICAL THERAPY THORACOLUMBAR TREATMENT   Patient Name: Diana Parsons MRN: [id number] DOB:April 06, 1934, 89 y.o., female Today's Date: 08/13/2024  END OF SESSION:  PT End of Session - 08/13/24 1153     Visit Number 3    Number of Visits 16    Date for Recertification  10/05/24    Authorization Type blue medicare    Progress Note Due on Visit 10    PT Start Time 1153    PT Stop Time 1233    PT Time Calculation (min) 40 min    Activity Tolerance Patient tolerated treatment well    Behavior During Therapy WFL for tasks assessed/performed          Past Medical History:  Diagnosis Date   Arthritis    Cancer (HCC)    Cataract    Diabetes mellitus without complication (HCC)    GERD (gastroesophageal reflux disease)    Past Surgical History:  Procedure Laterality Date   ABDOMINAL HYSTERECTOMY  89 yrs old   BREAST BIOPSY Right 09/23/2023   US  RT BREAST BX W LOC DEV 1ST LESION IMG BX SPEC US  GUIDE 09/23/2023 [facility]   BREAST BIOPSY Left 09/23/2023   US  LT BREAST BX W LOC DEV 1ST LESION IMG BX SPEC US  GUIDE 09/23/2023 [facility]   EYE SURGERY  Cataracts august 2021   left shoulder sx s/p fall  10/16/1999   has pin in place   lumbarp spine surgery  07/15/2014   Dr. Melchor   SPINE SURGERY     Patient Active Problem List   Diagnosis Date Noted   Thrombocytopenia 06/19/2024   Residual hemorrhoidal skin tags 06/01/2024   Malignant neoplasm of lower-outer quadrant of female breast (HCC) 10/14/2023   Severe obesity (BMI 35.0-35.9 with comorbidity) (HCC) 05/13/2023   Macular degeneration 06/07/2022   Primary osteoarthritis of both first carpometacarpal joints 06/05/2022   Osteopenia 03/07/2022   Chronic constipation 10/05/2021   Edema of right foot 03/06/2021   Combined forms of age-related cataract of left eye 06/02/2020   Aortic valve sclerosis 11/23/2019   Family history of lung cancer 11/09/2019   Orthopnea 11/09/2019   PND (paroxysmal  nocturnal dyspnea) 11/09/2019   Esophagitis 11/09/2019   Microalbuminuria due to type 2 diabetes mellitus (HCC) 07/20/2016   Lumbar degenerative disc disease 05/03/2014   Status post lumbar surgery 09/03/2013   Spinal stenosis of lumbar region without neurogenic claudication 08/30/2013   Tear of meniscus of knee 05/29/2013   Primary osteoarthritis of left knee 05/28/2012   Controlled diabetes mellitus type 2 with complications (HCC) 02/19/2007   HYPERCHOLESTEROLEMIA, PURE 02/03/2007   BREAST MASS, LEFT 02/03/2007   POSTMENOPAUSAL STATUS 02/03/2007    PCP: Alvan Craven, MD REFERRING PROVIDER: Durwin Christ, NP  REFERRING DIAG:  [phone] (ICD-10-CM) - Spinal stenosis, lumbar region without neurogenic claudication  M51.362 (ICD-10-CM) - Other intervertebral disc degeneration, lumbar region with discogenic back pain and lower extremity pain    Rationale for Evaluation and Treatment: Rehabilitation  THERAPY DIAG:  Other low back pain  Muscle weakness (generalized)  ONSET DATE: 05/03/2024  SUBJECTIVE:  SUBJECTIVE STATEMENT: Patient reports she was busy yesterday so she didn't get a chance to do her home exercises.   PERTINENT HISTORY:  Arthritis-- got knee injections bilat 3 weeks ago, arthritis, DM, h/o cancer (breast cancer)  PAIN:  Are you having pain? Yes: NPRS scale: 0/10 at rest  Pain location: low back  Pain description: achiness all over Aggravating factors: standing longer periods Relieving factors: resting, recliner  PRECAUTIONS: Fall  WEIGHT BEARING RESTRICTIONS: No  FALLS:  Has patient fallen in last 6 months? No  LIVING ENVIRONMENT: Lives with: lives alone Lives in: House/apartment Stairs: No Has following equipment at home: Vannie - 2 wheeled and shower  chair  OCCUPATION: retired  PLOF: Independent  PATIENT GOALS: Less pain, feel more comfortable maneuvering, wants to know what exercises to do  OBJECTIVE:  Note: Objective measures were completed at Evaluation unless otherwise noted.  DIAGNOSTIC FINDINGS:  MRI 05/03/24 IMPRESSION: 1. Left lateral recess protrusion with cephalad extension at L4-5 impinges on the descending left L4 root. 2. Mild to moderate central canal stenosis and right worse than left lateral recess narrowing at L3-4. Moderate to moderately severe bilateral foraminal narrowing is also present at this level. 3. Moderate right lateral recess narrowing at L2-3. 4. Mild to moderate left foraminal narrowing at L5-S1. Caudally extending disc on the right at L5-S1 contacts the right S1 root without compression or displacement.  PATIENT SURVEYS:  Modified Oswestry: 40%  COGNITION: Overall cognitive status: Within functional limits for tasks assessed     SENSATION: WFL  MUSCLE LENGTH: Hamstrings: Right -45 deg; Left -45 deg with pain in hamstring region  POSTURE: rounded shoulders, forward head, and posterior pelvic tilt  PALPATION: Edema noted over L scapula and rhomboid region-- noted with spinal ROM-- L side more pronounced  LUMBAR ROM:  AROM eval  Flexion 50% limitation  Extension 50% limitation  Right lateral flexion 50% limited With pain glut med region  Left lateral flexion 50% limited with pain glud med region  Right rotation   Left rotation    (Blank rows = not tested)  LOWER EXTREMITY ROM:   WFLs, some limitation to extend knee due to tightness in HS and pain in low back reproduced  LOWER EXTREMITY MMT:  gets a sensation the knees can give out when walking MMT Right eval Left eval  Hip flexion 5/5 4-/5  Hip extension    Hip abduction    Hip adduction    Hip internal rotation    Hip external rotation    Knee flexion 4/5 4/5  Knee extension 4/5 4/5  Ankle dorsiflexion    Ankle  plantarflexion    Ankle inversion    Ankle eversion     (Blank rows = not tested)  FUNCTIONAL TESTS:  Performed sit<.stand 3 reps with UE pressing into thighs  GAIT: Distance walked: 100 ft Assistive device utilized: Environmental [employer]ant - 2 wheeled Level of assistance: Modified independence Comments: walks in her home without device for short distances Gait speed=2.38 ft/sec   [facility] Adult PT Treatment:                                                DATE: 08/13/24 Neuromuscular re-ed: Single leg standing dec'ing UE support Sidestepping<>midline x 8 reps R And L dec'ing UE support Yoga block squeeze adductors with deep breathing x 5 reps Therapeutic  Exercise: Hamstring  stretching R and L seated Therapeutic Activity: Supine Bent knee falloutsR and L x 8 reps  Marching R and L x 10 reps Bridging x 10 reps without hamstring cramping today Sidelying pelvic mobility Sit<>stand x 6 reps Standing Heel raises x 12 reps compression into physioball for isometric core engagement Gait: Without device x 80 ft x 2 reps-- patient guards due to knee discomfort (bilat knee arthritis)   [facility] Adult PT Treatment:                                                DATE: 08/10/2024 Neuromuscular re-ed: Hooklying hip add isometric ball squeeze --> added PF activation Supine marching + PF activation  Bridges + PF activation --> Rt HS cramped Hooklying hip abd isometric press + green TB Bridges + hip abd with green TB --> Rt HS cramped Standing marching --> added yellow TB around ankle Therapeutic Activity: Supine marching --> painful on Rt lumbar Hooklying dynamic HS stretch --> added ankle pump for sciatic nerve glide Sit to stand with hands braced on thighs --> gradually decreasing weight through hands Seated forward & diagonal trunk flexion stretch roll out with red PB    [facility] Adult PT Treatment:                                                DATE: 08/06/24 Therapeutic Exercise: Hamstring stretch  seated flares R low back pain Standing gastroc stretch Supine Marching x 10 reps SLR Sit<>stand with UE assist                                                                                                                   PATIENT EDUCATION:  Education details: HEP Person educated: Patient Education method: Programmer, Multimedia, Demonstration, and Handouts Education comprehension: verbalized understanding, returned demonstration, and needs further education  HOME EXERCISE PROGRAM: Access Code: [email or web address] URL: [email or web address] Date: 08/06/2024 Prepared by: Tawni Ferrier  Exercises - Supine March  - 1 x daily - 5 x weekly - 1 sets - 10 reps - Supine Active Straight Leg Raise  - 1 x daily - 5 x weekly - 1 sets - 10 reps - Sit to Stand with Counter Support  - 2 x daily - 7 x weekly - 1 sets - 3-5 reps - Gastroc Stretch on Wall  - 1 x daily - 5 x weekly - 1 sets - 2 reps - 20 seconds hold - Heel Raises with Counter Support  - 1 x daily - 5 x weekly - 1 sets - 10 reps  ASSESSMENT:  CLINICAL IMPRESSION: The patient was able to tolerate a bridge today in therapy without HS cramping. PT continuing to progress strengthening in supine and standing working on core activation and  LE strength. PT to continue working towards STG/LTGs.  EVAL: Patient is an 89 y.o. female who was seen today for physical therapy evaluation and treatment for spinal stenosis and back pain. She presents with LE weakness, decreased flexibility in hamstrings, decreased flexibility in gastrocs, decreased lumbar AROM, and pain with activities. The patient also has bilat knee arthritis with some h/o knees giving way sensation. PT to address deficits to promote improvement in functional mobility and reduced pain.   GOALS: Goals reviewed with patient? Yes  SHORT TERM GOALS: Target date: 09/05/24  The patient will be indep with initial HEP Baseline: initiated at eval Goal status: INITIAL  2.  The patient  will perform sit<>stand x 10 reps to demo increasing functional strength.  Baseline:  Began at 3 reps-- patient noted requiring multiple attempts to rise from couch/low seat Goal status: INITIAL  3.  The patient will improve hip flexor strength to 5/5. Baseline:  4/5 Goal status: INITIAL  LONG TERM GOALS: Target date: 10/05/24  The patient will be indep with progression of HEP. Baseline:  initiated at eval Goal status: INITIAL  2.  The patient will improve modified oswestry by 12%  to demonstrate improved functional abilities. Baseline: 40% Goal status: INITIAL  3.  The patient will demo improved hamstring length to -30 from full extension bilat. Baseline:  -45 degrees-- tightness noted Goal status: INITIAL  4.  The patient will perform lateral leaning without pain to R and L sides. Baseline:  pain with lumbar lateral lean R and L Goal status: INITIAL  5.  The patient will report ability to stand x 30+ minutes without increased low back pain. Baseline:  Notes standing is limited Goal status: INITIAL  PLAN:  PT FREQUENCY: 2x/week  PT DURATION: 8 weeks  PLANNED INTERVENTIONS: [id number]- PT Re-evaluation, 97750- Physical Performance Testing, 97110-Therapeutic exercises, 97530- Therapeutic activity, [id number]- Neuromuscular re-education, 97535- Self Care, [id number]- Manual therapy, [id number]- Gait training, [phone]- Electrical stimulation (unattended), 20560 (1-2 muscles), [id number] (3+ muscles)- Dry Needling, Patient/Family education, Balance training, and Moist heat.  PLAN FOR NEXT SESSION: progress HEP prn, work on gentle mobility for low back, core activation, hamstring/gastroc lengthening hip and knee strengthening.  Levander Katzenstein, PT 08/13/2024, 11:53 AM

## 2024-08-15 DIAGNOSIS — E119 Type 2 diabetes mellitus without complications: Secondary | ICD-10-CM | POA: Diagnosis not present

## 2024-08-16 ENCOUNTER — Ambulatory Visit

## 2024-08-16 DIAGNOSIS — R222 Localized swelling, mass and lump, trunk: Secondary | ICD-10-CM | POA: Diagnosis not present

## 2024-08-16 DIAGNOSIS — R2232 Localized swelling, mass and lump, left upper limb: Secondary | ICD-10-CM | POA: Diagnosis not present

## 2024-08-17 ENCOUNTER — Ambulatory Visit: Attending: Nurse Practitioner

## 2024-08-17 DIAGNOSIS — M5459 Other low back pain: Secondary | ICD-10-CM | POA: Diagnosis not present

## 2024-08-17 DIAGNOSIS — M6281 Muscle weakness (generalized): Secondary | ICD-10-CM | POA: Diagnosis not present

## 2024-08-17 NOTE — Therapy (Signed)
 OUTPATIENT PHYSICAL THERAPY THORACOLUMBAR TREATMENT   Patient Name: Diana Parsons MRN: [id number] DOB:June 23, 1934, 89 y.o., female Today's Date: 08/17/2024  END OF SESSION:  PT End of Session - 08/17/24 1451     Visit Number 4    Number of Visits 16    Date for Recertification  10/05/24    Authorization Type [employer]    Progress Note Due on Visit 10    PT Start Time 1450    PT Stop Time 1528    PT Time Calculation (min) 38 min    Activity Tolerance Patient tolerated treatment well    Behavior During Therapy WFL for tasks assessed/performed          Past Medical History:  Diagnosis Date   Arthritis    Cancer (HCC)    Cataract    Diabetes mellitus without complication (HCC)    GERD (gastroesophageal reflux disease)    Past Surgical History:  Procedure Laterality Date   ABDOMINAL HYSTERECTOMY  89 yrs old   BREAST BIOPSY Right 09/23/2023   US  RT BREAST BX W LOC DEV 1ST LESION IMG BX SPEC US  GUIDE 09/23/2023 [facility]   BREAST BIOPSY Left 09/23/2023   US  LT BREAST BX W LOC DEV 1ST LESION IMG BX SPEC US  GUIDE 09/23/2023 [facility]   EYE SURGERY  Cataracts august 2021   left shoulder sx s/p fall  10/16/1999   has pin in place   lumbarp spine surgery  07/15/2014   Dr. Melchor   SPINE SURGERY     Patient Active Problem List   Diagnosis Date Noted   Thrombocytopenia 06/19/2024   Residual hemorrhoidal skin tags 06/01/2024   Malignant neoplasm of lower-outer quadrant of female breast (HCC) 10/14/2023   Severe obesity (BMI 35.0-35.9 with comorbidity) (HCC) 05/13/2023   Macular degeneration 06/07/2022   Primary osteoarthritis of both first carpometacarpal joints 06/05/2022   Osteopenia 03/07/2022   Chronic constipation 10/05/2021   Edema of right foot 03/06/2021   Combined forms of age-related cataract of left eye 06/02/2020   Aortic valve sclerosis 11/23/2019   Family history of lung cancer 11/09/2019   Orthopnea 11/09/2019   PND (paroxysmal  nocturnal dyspnea) 11/09/2019   Esophagitis 11/09/2019   Microalbuminuria due to type 2 diabetes mellitus (HCC) 07/20/2016   Lumbar degenerative disc disease 05/03/2014   Status post lumbar surgery 09/03/2013   Spinal stenosis of lumbar region without neurogenic claudication 08/30/2013   Tear of meniscus of knee 05/29/2013   Primary osteoarthritis of left knee 05/28/2012   Controlled diabetes mellitus type 2 with complications (HCC) 02/19/2007   HYPERCHOLESTEROLEMIA, PURE 02/03/2007   BREAST MASS, LEFT 02/03/2007   POSTMENOPAUSAL STATUS 02/03/2007    PCP: Alvan Craven, MD REFERRING PROVIDER: Durwin Christ, NP  REFERRING DIAG:  [phone] (ICD-10-CM) - Spinal stenosis, lumbar region without neurogenic claudication  M51.362 (ICD-10-CM) - Other intervertebral disc degeneration, lumbar region with discogenic back pain and lower extremity pain    Rationale for Evaluation and Treatment: Rehabilitation  THERAPY DIAG:  Other low back pain  Muscle weakness (generalized)  ONSET DATE: 05/03/2024  SUBJECTIVE:  SUBJECTIVE STATEMENT: Patient reports she had little time to review HEP, states she has been very busy.   PERTINENT HISTORY:  Arthritis-- got knee injections bilat 3 weeks ago, arthritis, DM, h/o cancer (breast cancer)  PAIN:  Are you having pain? Yes: NPRS scale: 0/10 at rest  Pain location: low back  Pain description: achiness all over Aggravating factors: standing longer periods Relieving factors: resting, recliner  PRECAUTIONS: Fall  WEIGHT BEARING RESTRICTIONS: No  FALLS:  Has patient fallen in last 6 months? No  LIVING ENVIRONMENT: Lives with: lives alone Lives in: House/apartment Stairs: No Has following equipment at home: Vannie - 2 wheeled and shower chair  OCCUPATION:  retired  PLOF: Independent  PATIENT GOALS: Less pain, feel more comfortable maneuvering, wants to know what exercises to do  OBJECTIVE:  Note: Objective measures were completed at Evaluation unless otherwise noted.  DIAGNOSTIC FINDINGS:  MRI 05/03/24 IMPRESSION: 1. Left lateral recess protrusion with cephalad extension at L4-5 impinges on the descending left L4 root. 2. Mild to moderate central canal stenosis and right worse than left lateral recess narrowing at L3-4. Moderate to moderately severe bilateral foraminal narrowing is also present at this level. 3. Moderate right lateral recess narrowing at L2-3. 4. Mild to moderate left foraminal narrowing at L5-S1. Caudally extending disc on the right at L5-S1 contacts the right S1 root without compression or displacement.  PATIENT SURVEYS:  Modified Oswestry: 40%  COGNITION: Overall cognitive status: Within functional limits for tasks assessed     SENSATION: WFL  MUSCLE LENGTH: Hamstrings: Right -45 deg; Left -45 deg with pain in hamstring region  POSTURE: rounded shoulders, forward head, and posterior pelvic tilt  PALPATION: Edema noted over L scapula and rhomboid region-- noted with spinal ROM-- L side more pronounced  LUMBAR ROM:  AROM eval  Flexion 50% limitation  Extension 50% limitation  Right lateral flexion 50% limited With pain glut med region  Left lateral flexion 50% limited with pain glud med region  Right rotation   Left rotation    (Blank rows = not tested)  LOWER EXTREMITY ROM:   WFLs, some limitation to extend knee due to tightness in HS and pain in low back reproduced  LOWER EXTREMITY MMT:  gets a sensation the knees can give out when walking MMT Right eval Left eval  Hip flexion 5/5 4-/5  Hip extension    Hip abduction    Hip adduction    Hip internal rotation    Hip external rotation    Knee flexion 4/5 4/5  Knee extension 4/5 4/5  Ankle dorsiflexion    Ankle plantarflexion    Ankle  inversion    Ankle eversion     (Blank rows = not tested)  FUNCTIONAL TESTS:  Performed sit<.stand 3 reps with UE pressing into thighs  GAIT: Distance walked: 100 ft Assistive device utilized: Environmental Consultant - 2 wheeled Level of assistance: Modified independence Comments: walks in her home without device for short distances Gait speed=2.38 ft/sec  [facility] Adult PT Treatment:                                                DATE: 08/17/2024 Therapeutic Exercise: Hamstring stretch --> foot propped on box, hip hinge forward Standing gastroc stretch (Lt) Neuromuscular re-ed: Bent knee fall out + yellow TB Hooklying hip add isometric ball squeeze + PF activation Hooklying  hip abd isometric press + blue TB Bridges + PF activation  Standing marching --> added yellow TB around ankle Therapeutic Activity: Hooklying marching Seated straight leg raise Standing heel raises Standing modified single leg deadlift     [facility] Adult PT Treatment:                                                DATE: 08/13/24 Neuromuscular re-ed: Single leg standing dec'ing UE support Sidestepping<>midline x 8 reps R And L dec'ing UE support Yoga block squeeze adductors with deep breathing x 5 reps Therapeutic  Exercise: Hamstring stretching R and L seated Therapeutic Activity: Supine Bent knee falloutsR and L x 8 reps  Marching R and L x 10 reps Bridging x 10 reps without hamstring cramping today Sidelying pelvic mobility Sit<>stand x 6 reps Standing Heel raises x 12 reps compression into physioball for isometric core engagement Gait: Without device x 80 ft x 2 reps-- patient guards due to knee discomfort (bilat knee arthritis)   [facility] Adult PT Treatment:                                                DATE: 08/10/2024 Neuromuscular re-ed: Hooklying hip add isometric ball squeeze --> added PF activation Supine marching + PF activation  Bridges + PF activation --> Rt HS cramped Hooklying hip abd isometric press +  green TB Bridges + hip abd with green TB --> Rt HS cramped Standing marching --> added yellow TB around ankle Therapeutic Activity: Supine marching --> painful on Rt lumbar Seated HS stretch --> foot propped on low box + hip hinge forward Sit to stand with hands braced on thighs --> gradually decreasing weight through hands Seated forward & diagonal trunk flexion stretch roll out with red PB    [facility] Adult PT Treatment:                                                DATE: 08/06/24 Therapeutic Exercise: Hamstring stretch seated flares R low back pain Standing gastroc stretch Supine Marching x 10 reps SLR Sit<>stand with UE assist                                                                                                                   PATIENT EDUCATION:  Education details: HEP Person educated: Patient Education method: Programmer, Multimedia, Demonstration, and Handouts Education comprehension: verbalized understanding, returned demonstration, and needs further education  HOME EXERCISE PROGRAM: Access Code: [email or web address] URL: [email or web address] Date: 08/06/2024 Prepared by: Tawni Ferrier  Exercises - Supine March  - 1 x daily - 5 x weekly -  1 sets - 10 reps - Supine Active Straight Leg Raise  - 1 x daily - 5 x weekly - 1 sets - 10 reps - Sit to Stand with Counter Support  - 2 x daily - 7 x weekly - 1 sets - 3-5 reps - Gastroc Stretch on Wall  - 1 x daily - 5 x weekly - 1 sets - 2 reps - 20 seconds hold - Heel Raises with Counter Support  - 1 x daily - 5 x weekly - 1 sets - 10 reps  ASSESSMENT:  CLINICAL IMPRESSION: Cueing pelvic floor activation during bridges decreased hamstring cramping; patient able to complete a full set of bridges with no LE cramping. Progressed hip strengthening with light resistance in standing. Noted weakness in Lt quads with seated leg raises.   EVAL: Patient is an 89 y.o. female who was seen today for physical therapy evaluation and  treatment for spinal stenosis and back pain. She presents with LE weakness, decreased flexibility in hamstrings, decreased flexibility in gastrocs, decreased lumbar AROM, and pain with activities. The patient also has bilat knee arthritis with some h/o knees giving way sensation. PT to address deficits to promote improvement in functional mobility and reduced pain.   GOALS: Goals reviewed with patient? Yes  SHORT TERM GOALS: Target date: 09/05/24  The patient will be indep with initial HEP Baseline: initiated at eval Goal status: INITIAL  2.  The patient will perform sit<>stand x 10 reps to demo increasing functional strength.  Baseline:  Began at 3 reps-- patient noted requiring multiple attempts to rise from couch/low seat Goal status: INITIAL  3.  The patient will improve hip flexor strength to 5/5. Baseline:  4/5 Goal status: INITIAL  LONG TERM GOALS: Target date: 10/05/24  The patient will be indep with progression of HEP. Baseline:  initiated at eval Goal status: INITIAL  2.  The patient will improve modified oswestry by 12%  to demonstrate improved functional abilities. Baseline: 40% Goal status: INITIAL  3.  The patient will demo improved hamstring length to -30 from full extension bilat. Baseline:  -45 degrees-- tightness noted Goal status: INITIAL  4.  The patient will perform lateral leaning without pain to R and L sides. Baseline:  pain with lumbar lateral lean R and L Goal status: INITIAL  5.  The patient will report ability to stand x 30+ minutes without increased low back pain. Baseline:  Notes standing is limited Goal status: INITIAL  PLAN:  PT FREQUENCY: 2x/week  PT DURATION: 8 weeks  PLANNED INTERVENTIONS: [id number]- PT Re-evaluation, 97750- Physical Performance Testing, 97110-Therapeutic exercises, 97530- Therapeutic activity, [id number]- Neuromuscular re-education, 97535- Self Care, [id number]- Manual therapy, [id number]- Gait training, [phone]- Electrical stimulation  (unattended), 20560 (1-2 muscles), 20561 (3+ muscles)- Dry Needling, Patient/Family education, Balance training, and Moist heat.  PLAN FOR NEXT SESSION: progress HEP prn, work on gentle mobility for low back, core activation, hamstring/gastroc lengthening hip and knee strengthening.  Lamarr GORMAN Price, PTA 08/17/2024, 4:44 PM

## 2024-08-19 ENCOUNTER — Other Ambulatory Visit: Payer: Self-pay | Admitting: Family Medicine

## 2024-08-19 DIAGNOSIS — E118 Type 2 diabetes mellitus with unspecified complications: Secondary | ICD-10-CM

## 2024-08-20 ENCOUNTER — Other Ambulatory Visit: Payer: Self-pay | Admitting: Family Medicine

## 2024-08-20 DIAGNOSIS — M6289 Other specified disorders of muscle: Secondary | ICD-10-CM | POA: Diagnosis not present

## 2024-08-20 DIAGNOSIS — K209 Esophagitis, unspecified without bleeding: Secondary | ICD-10-CM

## 2024-08-21 ENCOUNTER — Ambulatory Visit

## 2024-08-21 DIAGNOSIS — M6281 Muscle weakness (generalized): Secondary | ICD-10-CM | POA: Diagnosis not present

## 2024-08-21 DIAGNOSIS — M5459 Other low back pain: Secondary | ICD-10-CM

## 2024-08-21 NOTE — Therapy (Signed)
 OUTPATIENT PHYSICAL THERAPY THORACOLUMBAR TREATMENT   Patient Name: Diana Parsons MRN: [id number] DOB:05-04-1934, 89 y.o., female Today's Date: 08/21/2024  END OF SESSION:  PT End of Session - 08/21/24 0926     Visit Number 5    Number of Visits 16    Date for Recertification  10/05/24    Authorization Type [employer]    Progress Note Due on Visit 10    PT Start Time 0930    PT Stop Time 1010    PT Time Calculation (min) 40 min    Activity Tolerance Patient tolerated treatment well    Behavior During Therapy WFL for tasks assessed/performed          Past Medical History:  Diagnosis Date   Arthritis    Cancer (HCC)    Cataract    Diabetes mellitus without complication (HCC)    GERD (gastroesophageal reflux disease)    Past Surgical History:  Procedure Laterality Date   ABDOMINAL HYSTERECTOMY  89 yrs old   BREAST BIOPSY Right 09/23/2023   US  RT BREAST BX W LOC DEV 1ST LESION IMG BX SPEC US  GUIDE 09/23/2023 [facility]   BREAST BIOPSY Left 09/23/2023   US  LT BREAST BX W LOC DEV 1ST LESION IMG BX SPEC US  GUIDE 09/23/2023 [facility]   EYE SURGERY  Cataracts august 2021   left shoulder sx s/p fall  10/16/1999   has pin in place   lumbarp spine surgery  07/15/2014   Dr. Melchor   SPINE SURGERY     Patient Active Problem List   Diagnosis Date Noted   Thrombocytopenia 06/19/2024   Residual hemorrhoidal skin tags 06/01/2024   Malignant neoplasm of lower-outer quadrant of female breast (HCC) 10/14/2023   Severe obesity (BMI 35.0-35.9 with comorbidity) (HCC) 05/13/2023   Macular degeneration 06/07/2022   Primary osteoarthritis of both first carpometacarpal joints 06/05/2022   Osteopenia 03/07/2022   Chronic constipation 10/05/2021   Edema of right foot 03/06/2021   Combined forms of age-related cataract of left eye 06/02/2020   Aortic valve sclerosis 11/23/2019   Family history of lung cancer 11/09/2019   Orthopnea 11/09/2019   PND (paroxysmal  nocturnal dyspnea) 11/09/2019   Esophagitis 11/09/2019   Microalbuminuria due to type 2 diabetes mellitus (HCC) 07/20/2016   Lumbar degenerative disc disease 05/03/2014   Status post lumbar surgery 09/03/2013   Spinal stenosis of lumbar region without neurogenic claudication 08/30/2013   Tear of meniscus of knee 05/29/2013   Primary osteoarthritis of left knee 05/28/2012   Controlled diabetes mellitus type 2 with complications (HCC) 02/19/2007   HYPERCHOLESTEROLEMIA, PURE 02/03/2007   BREAST MASS, LEFT 02/03/2007   POSTMENOPAUSAL STATUS 02/03/2007    PCP: Alvan Craven, MD REFERRING PROVIDER: Durwin Christ, NP  REFERRING DIAG:  [phone] (ICD-10-CM) - Spinal stenosis, lumbar region without neurogenic claudication  M51.362 (ICD-10-CM) - Other intervertebral disc degeneration, lumbar region with discogenic back pain and lower extremity pain    Rationale for Evaluation and Treatment: Rehabilitation  THERAPY DIAG:  Other low back pain  Muscle weakness (generalized)  ONSET DATE: 05/03/2024  SUBJECTIVE:  SUBJECTIVE STATEMENT: Patient reports she received a good report from pelvic floor therapist on her evaluation. Patient states she got a few exercises in from HEP and did not have any cramping in hamstrings. Patient states she received MRI results yesterday and is waiting for PCP to review results with her. Patient   PERTINENT HISTORY:  Arthritis-- got knee injections bilat 3 weeks ago, arthritis, DM, h/o cancer (breast cancer)  PAIN:  Are you having pain? Yes: NPRS scale: 0/10 at rest  Pain location: low back  Pain description: achiness all over Aggravating factors: standing longer periods Relieving factors: resting, recliner  PRECAUTIONS: Fall  WEIGHT BEARING RESTRICTIONS: No  FALLS:  Has  patient fallen in last 6 months? No  LIVING ENVIRONMENT: Lives with: lives alone Lives in: House/apartment Stairs: No Has following equipment at home: Vannie - 2 wheeled and shower chair  OCCUPATION: retired  PLOF: Independent  PATIENT GOALS: Less pain, feel more comfortable maneuvering, wants to know what exercises to do  OBJECTIVE:  Note: Objective measures were completed at Evaluation unless otherwise noted.  DIAGNOSTIC FINDINGS:  MRI 05/03/24 IMPRESSION: 1. Left lateral recess protrusion with cephalad extension at L4-5 impinges on the descending left L4 root. 2. Mild to moderate central canal stenosis and right worse than left lateral recess narrowing at L3-4. Moderate to moderately severe bilateral foraminal narrowing is also present at this level. 3. Moderate right lateral recess narrowing at L2-3. 4. Mild to moderate left foraminal narrowing at L5-S1. Caudally extending disc on the right at L5-S1 contacts the right S1 root without compression or displacement.  PATIENT SURVEYS:  Modified Oswestry: 40%  COGNITION: Overall cognitive status: Within functional limits for tasks assessed     SENSATION: WFL  MUSCLE LENGTH: Hamstrings: Right -45 deg; Left -45 deg with pain in hamstring region  POSTURE: rounded shoulders, forward head, and posterior pelvic tilt  PALPATION: Edema noted over L scapula and rhomboid region-- noted with spinal ROM-- L side more pronounced  LUMBAR ROM:  AROM eval  Flexion 50% limitation  Extension 50% limitation  Right lateral flexion 50% limited With pain glut med region  Left lateral flexion 50% limited with pain glud med region  Right rotation   Left rotation    (Blank rows = not tested)  LOWER EXTREMITY ROM:   WFLs, some limitation to extend knee due to tightness in HS and pain in low back reproduced  LOWER EXTREMITY MMT:  gets a sensation the knees can give out when walking MMT Right eval Left eval  Hip flexion 5/5 4-/5   Hip extension    Hip abduction    Hip adduction    Hip internal rotation    Hip external rotation    Knee flexion 4/5 4/5  Knee extension 4/5 4/5  Ankle dorsiflexion    Ankle plantarflexion    Ankle inversion    Ankle eversion     (Blank rows = not tested)  FUNCTIONAL TESTS:  Performed sit<.stand 3 reps with UE pressing into thighs  GAIT: Distance walked: 100 ft Assistive device utilized: Walker - 2 wheeled Level of assistance: Modified independence Comments: walks in her home without device for short distances Gait speed=2.38 ft/sec   [facility] Adult PT Treatment:                                                DATE:  08/21/2024 Neuromuscular re-ed: Bent knee fall out + yellow TB 2x10  Marching + yellow TB --> cues for core stabilization 3x10 Bridges + yellow TB around thighs with PF activation 2x10 (2 pillows under head okay, 1 pillow caused HS cramping) Seated SLR 8 x 5 sec (bil)  Seated glute set 10 x 3-5 sec Therapeutic Activity: Seated heel slides with slider + yellow TB LTR --> small range Seated forward trunk flexion stretch with red PB x10 Seared lateral trunk flexion with orange PB x6 (bil) Sit to stand + PF activation x6 --> 2x5 increasing speed (walker in front of pt, hands braced on thighs)    [facility] Adult PT Treatment:                                                DATE: 08/17/2024 Therapeutic Exercise: Hamstring stretch --> foot propped on box, hip hinge forward Standing gastroc stretch (Lt) Neuromuscular re-ed: Bent knee fall out + yellow TB Hooklying hip add isometric ball squeeze + PF activation Hooklying hip abd isometric press + blue TB Bridges + PF activation  Standing marching --> added yellow TB around ankle Therapeutic Activity: Hooklying marching Seated straight leg raise Standing heel raises Standing modified single leg deadlift     [facility] Adult PT Treatment:                                                DATE: 08/13/24 Neuromuscular  re-ed: Single leg standing dec'ing UE support Sidestepping<>midline x 8 reps R And L dec'ing UE support Yoga block squeeze adductors with deep breathing x 5 reps Therapeutic  Exercise: Hamstring stretching R and L seated Therapeutic Activity: Supine Bent knee falloutsR and L x 8 reps  Marching R and L x 10 reps Bridging x 10 reps without hamstring cramping today Sidelying pelvic mobility Sit<>stand x 6 reps Standing Heel raises x 12 reps compression into physioball for isometric core engagement Gait: Without device x 80 ft x 2 reps-- patient guards due to knee discomfort (bilat knee arthritis)   [facility] Adult PT Treatment:                                                DATE: 08/10/2024 Neuromuscular re-ed: Hooklying hip add isometric ball squeeze --> added PF activation Supine marching + PF activation  Bridges + PF activation --> Rt HS cramped Hooklying hip abd isometric press + green TB Bridges + hip abd with green TB --> Rt HS cramped Standing marching --> added yellow TB around ankle Therapeutic Activity: Supine marching --> painful on Rt lumbar Seated HS stretch --> foot propped on low box + hip hinge forward Sit to stand with hands braced on thighs --> gradually decreasing weight through hands Seated forward & diagonal trunk flexion stretch roll out with red PB  PATIENT EDUCATION:  Education details: HEP Person educated: Patient Education method: Programmer, Multimedia, Facilities Manager, and Handouts Education comprehension: verbalized understanding, returned demonstration, and needs further education  HOME EXERCISE PROGRAM: Access Code: [email or web address] URL: [email or web address] Date: 08/21/2024 Prepared by: Lamarr Price  Exercises - Supine March  - 1 x daily - 5 x weekly - 1 sets - 10 reps - Supine Active Straight Leg Raise  - 1 x daily - 5 x weekly - 1 sets  - 10 reps - Gastroc Stretch on Wall  - 1 x daily - 5 x weekly - 1 sets - 2 reps - 20 seconds hold - Heel Raises with Counter Support  - 1 x daily - 5 x weekly - 1 sets - 10 reps - Pelvic Floor Contractions in Hooklying with Adduction  - 1 x daily - 7 x weekly - 1 sets - 10 reps - 5 sec hold - Supine Sciatic Nerve Glide  - 2 x daily - 7 x weekly - 1 sets - 10 reps - Sit to Stand  - 1 x daily - 7 x weekly - 3 sets - 5 reps  ASSESSMENT:  CLINICAL IMPRESSION: LE and glute strengthening continued as tolerated; hamstring cramping alleviated with head propped on two pillows (cramping present with removal of one pillow). Gradually increased speed during sit to stand to challenge functional strengthening of LE and eccentric control when sitting; fatigue noted by last repetition on final set. Increased pain on Rt side of low back with transition from supine to sitting; pain alleviated with forward trunk flexion stretch with cues to stabilize pelvis.   EVAL: Patient is an 89 y.o. female who was seen today for physical therapy evaluation and treatment for spinal stenosis and back pain. She presents with LE weakness, decreased flexibility in hamstrings, decreased flexibility in gastrocs, decreased lumbar AROM, and pain with activities. The patient also has bilat knee arthritis with some h/o knees giving way sensation. PT to address deficits to promote improvement in functional mobility and reduced pain.   GOALS: Goals reviewed with patient? Yes  SHORT TERM GOALS: Target date: 09/05/24  The patient will be indep with initial HEP Baseline: initiated at eval Goal status: INITIAL  2.  The patient will perform sit<>stand x 10 reps to demo increasing functional strength.  Baseline:  Began at 3 reps-- patient noted requiring multiple attempts to rise from couch/low seat Goal status: INITIAL  3.  The patient will improve hip flexor strength to 5/5. Baseline:  4/5 Goal status: INITIAL  LONG TERM GOALS:  Target date: 10/05/24  The patient will be indep with progression of HEP. Baseline:  initiated at eval Goal status: INITIAL  2.  The patient will improve modified oswestry by 12%  to demonstrate improved functional abilities. Baseline: 40% Goal status: INITIAL  3.  The patient will demo improved hamstring length to -30 from full extension bilat. Baseline:  -45 degrees-- tightness noted Goal status: INITIAL  4.  The patient will perform lateral leaning without pain to R and L sides. Baseline:  pain with lumbar lateral lean R and L Goal status: INITIAL  5.  The patient will report ability to stand x 30+ minutes without increased low back pain. Baseline:  Notes standing is limited Goal status: INITIAL  PLAN:  PT FREQUENCY: 2x/week  PT DURATION: 8 weeks  PLANNED INTERVENTIONS: [id number]- PT Re-evaluation, 97750- Physical Performance Testing, 97110-Therapeutic exercises, 97530- Therapeutic activity, [id number]- Neuromuscular re-education, 97535- Self Care, [id number]- Manual therapy, [id number]- Gait training, [phone]- Electrical stimulation (  unattended), [id number] (1-2 muscles), [id number] (3+ muscles)- Dry Needling, Patient/Family education, Balance training, and Moist heat.  PLAN FOR NEXT SESSION: progress HEP prn, work on gentle mobility for low back, core/PF stabilization, hamstring/gastroc lengthening hip and knee strengthening. Progress standing exercises as tolerated.  Lamarr GORMAN Price, PTA 08/21/2024, 10:13 AM

## 2024-08-24 ENCOUNTER — Ambulatory Visit: Admitting: Rehabilitative and Restorative Service Providers"

## 2024-08-24 ENCOUNTER — Encounter: Payer: Self-pay | Admitting: Rehabilitative and Restorative Service Providers"

## 2024-08-24 DIAGNOSIS — M6281 Muscle weakness (generalized): Secondary | ICD-10-CM

## 2024-08-24 DIAGNOSIS — M5459 Other low back pain: Secondary | ICD-10-CM | POA: Diagnosis not present

## 2024-08-24 NOTE — Therapy (Signed)
 OUTPATIENT PHYSICAL THERAPY THORACOLUMBAR TREATMENT   Patient Name: Diana Parsons MRN: [id number] DOB:02-03-1934, 89 y.o., female Today's Date: 08/24/2024  END OF SESSION:  PT End of Session - 08/24/24 1150     Visit Number 6    Number of Visits 16    Date for Recertification  10/05/24    Authorization Type [employer] medic[employer]    Progress Note Due on Visit 10    PT Start Time 1150    PT Stop Time 1230    PT Time Calculation (min) 40 min    Activity Tolerance Patient tolerated treatment well    Behavior During Therapy WFL for tasks assessed/performed         Past Medical History:  Diagnosis Date   Arthritis    Cancer (HCC)    Cataract    Diabetes mellitus without complication (HCC)    GERD (gastroesophageal reflux disease)    Past Surgical History:  Procedure Laterality Date   ABDOMINAL HYSTERECTOMY  89 yrs old   BREAST BIOPSY Right 09/23/2023   US  RT BREAST BX W LOC DEV 1ST LESION IMG BX SPEC US  GUIDE 09/23/2023 [facility]   BREAST BIOPSY Left 09/23/2023   US  LT BREAST BX W LOC DEV 1ST LESION IMG BX SPEC US  GUIDE 09/23/2023 [facility]   EYE SURGERY  Cataracts august 2021   left shoulder sx s/p fall  10/16/1999   has pin in place   lumbarp spine surgery  07/15/2014   Dr. Melchor   SPINE SURGERY     Patient Active Problem List   Diagnosis Date Noted   Thrombocytopenia 06/19/2024   Residual hemorrhoidal skin tags 06/01/2024   Malignant neoplasm of lower-outer quadrant of female breast (HCC) 10/14/2023   Severe obesity (BMI 35.0-35.9 with comorbidity) (HCC) 05/13/2023   Macular degeneration 06/07/2022   Primary osteoarthritis of both first carpometacarpal joints 06/05/2022   Osteopenia 03/07/2022   Chronic constipation 10/05/2021   Edema of right foot 03/06/2021   Combined forms of age-related cataract of left eye 06/02/2020   Aortic valve sclerosis 11/23/2019   Family history of lung cancer 11/09/2019   Orthopnea 11/09/2019   PND (paroxysmal nocturnal  dyspnea) 11/09/2019   Esophagitis 11/09/2019   Microalbuminuria due to type 2 diabetes mellitus (HCC) 07/20/2016   Lumbar degenerative disc disease 05/03/2014   Status post lumbar surgery 09/03/2013   Spinal stenosis of lumbar region without neurogenic claudication 08/30/2013   Tear of meniscus of knee 05/29/2013   Primary osteoarthritis of left knee 05/28/2012   Controlled diabetes mellitus type 2 with complications (HCC) 02/19/2007   HYPERCHOLESTEROLEMIA, PURE 02/03/2007   BREAST MASS, LEFT 02/03/2007   POSTMENOPAUSAL STATUS 02/03/2007    PCP: Alvan Craven, MD REFERRING PROVIDER: Durwin Christ, NP  REFERRING DIAG:  [phone] (ICD-10-CM) - Spinal stenosis, lumbar region without neurogenic claudication  M51.362 (ICD-10-CM) - Other intervertebral disc degeneration, lumbar region with discogenic back pain and lower extremity pain    Rationale for Evaluation and Treatment: Rehabilitation  THERAPY DIAG:  Other low back pain  Muscle weakness (generalized)  ONSET DATE: 05/03/2024  SUBJECTIVE:  SUBJECTIVE STATEMENT: The patient reports low back not sore today, but her knees are sore with the cold weather. Patient states she received MRI results, but has not had follow up from MD yet.  PERTINENT HISTORY:  Arthritis-- got knee injections bilat 3 weeks ago, arthritis, DM, h/o cancer (breast cancer)  PAIN:  Are you having pain? Yes: NPRS scale: 0/10 at rest  Pain location: low back  Pain description: achiness all over Aggravating factors: standing longer periods Relieving factors: resting, recliner  PRECAUTIONS: Fall  WEIGHT BEARING RESTRICTIONS: No  FALLS:  Has patient fallen in last 6 months? No  LIVING ENVIRONMENT: Lives with: lives alone Lives in: House/apartment Stairs: No Has following  equipment at home: Vannie - 2 wheeled and shower chair  OCCUPATION: retired  PLOF: Independent  PATIENT GOALS: Less pain, feel more comfortable maneuvering, wants to know what exercises to do  OBJECTIVE:  Note: Objective measures were completed at Evaluation unless otherwise noted.  DIAGNOSTIC FINDINGS:  MRI 05/03/24 IMPRESSION: 1. Left lateral recess protrusion with cephalad extension at L4-5 impinges on the descending left L4 root. 2. Mild to moderate central canal stenosis and right worse than left lateral recess narrowing at L3-4. Moderate to moderately severe bilateral foraminal narrowing is also present at this level. 3. Moderate right lateral recess narrowing at L2-3. 4. Mild to moderate left foraminal narrowing at L5-S1. Caudally extending disc on the right at L5-S1 contacts the right S1 root without compression or displacement.  PATIENT SURVEYS:  Modified Oswestry: 40%  COGNITION: Overall cognitive status: Within functional limits for tasks assessed     SENSATION: WFL  MUSCLE LENGTH: Hamstrings: Right -45 deg; Left -45 deg with pain in hamstring region  POSTURE: rounded shoulders, forward head, and posterior pelvic tilt  PALPATION: Edema noted over L scapula and rhomboid region-- noted with spinal ROM-- L side more pronounced  LUMBAR ROM:  AROM eval 08/24/24  Flexion 50% limitation Not painful Able to reach to floor today  Extension 50% limitation WFLs  Right lateral flexion 50% limited With pain glut med region 25% limited Mild pain mid back  Left lateral flexion 50% limited with pain glud med region 25% limited Mild pain mid back  Right rotation    Left rotation     (Blank rows = not tested)  LOWER EXTREMITY ROM:   WFLs, some limitation to extend knee due to tightness in HS and pain in low back reproduced  LOWER EXTREMITY MMT:  gets a sensation the knees can give out when walking MMT Right eval Left eval 08/24/24  Hip flexion 5/5 4-/5   Hip  extension     Hip abduction     Hip adduction     Hip internal rotation     Hip external rotation     Knee flexion 4/5 4/5   Knee extension 4/5 4/5   Ankle dorsiflexion   5/5 bilat  Ankle plantarflexion     Ankle inversion     Ankle eversion      (Blank rows = not tested)  FUNCTIONAL TESTS:  Performed sit<.stand 3 reps with UE pressing into thighs  GAIT: Distance walked: 100 ft Assistive device utilized: Walker - 2 wheeled Level of assistance: Modified independence Comments: walks in her home without device for short distances Gait speed=2.38 ft/sec  [facility] Adult PT Treatment:  DATE: 08/24/24 Therapeutic Exercise: Seated HS stretch R and L x 1 reps Neuromuscular re-ed: Bent knee fallouts x 10 r Bridges without band x 10 reps Bridges with yellow TB around thighs x 10 reps with 2 pillows under head  Alternating foot taps to 4 step with CGA  Single leg standing near support surface with intermittent UE support Standing in stride doing alternate UE reaching Therapeutic Activity: Small range lumbar rocking -- hurts bony prominences, so put a deflated pilates ball under sacrum  Supine marching x 10 reps Supine SLR R and L sides  Heel raises x 15 reps with ball squeeze between heels Sit<>stand x 10 reps Step ups to 4 surfaces x 5 reps-- L side initially had pain Gait: Heel walking, toe walking (unable to tolerate on the L side),  Marching x 15 feet-- gets some pain in L knee Gait x 80 ft x 3 times   [facility] Adult PT Treatment:                                                DATE: 08/21/2024 Neuromuscular re-ed: Bent knee fall out + yellow TB 2x10  Marching + yellow TB --> cues for core stabilization 3x10 Bridges + yellow TB around thighs with PF activation 2x10 (2 pillows under head okay, 1 pillow caused HS cramping) Seated SLR 8 x 5 sec (bil)  Seated glute set 10 x 3-5 sec Therapeutic Activity: Seated heel slides with slider +  yellow TB LTR --> small range Seated forward trunk flexion stretch with red PB x10 Seared lateral trunk flexion with orange PB x6 (bil) Sit to stand + PF activation x6 --> 2x5 increasing speed (walker in front of pt, hands braced on thighs)   [facility] Adult PT Treatment:                                                DATE: 08/17/2024 Therapeutic Exercise: Hamstring stretch --> foot propped on box, hip hinge forward Standing gastroc stretch (Lt) Neuromuscular re-ed: Bent knee fall out + yellow TB Hooklying hip add isometric ball squeeze + PF activation Hooklying hip abd isometric press + blue TB Bridges + PF activation  Standing marching --> added yellow TB around ankle Therapeutic Activity: Hooklying marching Seated straight leg raise Standing heel raises Standing modified single leg deadlift                                                                                                                   PATIENT EDUCATION:  Education details: HEP Person educated: Patient Education method: Programmer, Multimedia, Facilities Manager, and Handouts Education comprehension: verbalized understanding, returned demonstration, and needs further education  HOME EXERCISE PROGRAM: Access Code: [email or web address] URL: [email or web address] Date: 08/21/2024 Prepared by: Lamarr Price  Exercises - Supine March  - 1 x daily - 5 x weekly - 1 sets - 10 reps - Supine Active Straight Leg Raise  - 1 x daily - 5 x weekly - 1 sets - 10 reps - Gastroc Stretch on Wall  - 1 x daily - 5 x weekly - 1 sets - 2 reps - 20 seconds hold - Heel Raises with Counter Support  - 1 x daily - 5 x weekly - 1 sets - 10 reps - Pelvic Floor Contractions in Hooklying with Adduction  - 1 x daily - 7 x weekly - 1 sets - 10 reps - 5 sec hold - Supine Sciatic Nerve Glide  - 2 x daily - 7 x weekly - 1 sets - 10 reps - Sit to Stand  - 1 x daily - 7 x weekly - 3 sets - 5 reps  ASSESSMENT:  CLINICAL IMPRESSION: The patient is progressing  early towards STGs meeting 2 STGs. She has improved sit<>stand, pain level, and is doing HEP consistently. PT continuing to work on LE strength, core strengthening, and general mobility working towards STGs/LTGs. Pt also able to demonstrate significant improvement in lumbar ROM as compared to evaluation.  EVAL: Patient is an 89 y.o. female who was seen today for physical therapy evaluation and treatment for spinal stenosis and back pain. She presents with LE weakness, decreased flexibility in hamstrings, decreased flexibility in gastrocs, decreased lumbar AROM, and pain with activities. The patient also has bilat knee arthritis with some h/o knees giving way sensation. PT to address deficits to promote improvement in functional mobility and reduced pain.   GOALS: Goals reviewed with patient? Yes  SHORT TERM GOALS: Target date: 09/05/24  The patient will be indep with initial HEP Baseline: initiated at eval Goal status: MET on 08/24/24  2.  The patient will perform sit<>stand x 10 reps to demo increasing functional strength.  Baseline:  Began at 3 reps-- patient noted requiring multiple attempts to rise from couch/low seat Goal status: MET on 08/24/24  3.  The patient will improve hip flexor strength to 5/5. Baseline:  4/5 Goal status: INITIAL  LONG TERM GOALS: Target date: 10/05/24  The patient will be indep with progression of HEP. Baseline:  initiated at eval Goal status: INITIAL  2.  The patient will improve modified oswestry by 12%  to demonstrate improved functional abilities. Baseline: 40% Goal status: INITIAL  3.  The patient will demo improved hamstring length to -30 from full extension bilat. Baseline:  -45 degrees-- tightness noted Goal status: INITIAL  4.  The patient will perform lateral leaning without pain to R and L sides. Baseline:  pain with lumbar lateral lean R and L Goal status: INITIAL  5.  The patient will report ability to stand x 30+ minutes without  increased low back pain. Baseline:  Notes standing is limited Goal status: INITIAL  PLAN:  PT FREQUENCY: 2x/week  PT DURATION: 8 weeks  PLANNED INTERVENTIONS: [id number]- PT Re-evaluation, 97750- Physical Performance Testing, 97110-Therapeutic exercises, 97530- Therapeutic activity, [id number]- Neuromuscular re-education, 97535- Self Care, [id number]- Manual therapy, [id number]- Gait training, [phone]- Electrical stimulation (unattended), 20560 (1-2 muscles), 20561 (3+ muscles)- Dry Needling, Patient/Family education, Balance training, and Moist heat.  PLAN FOR NEXT SESSION: progress HEP prn, work on gentle mobility for low back, core/PF stabilization, hamstring/gastroc lengthening hip and knee strengthening. Progress standing exercises as tolerated.  Shankar Silber, PT 08/24/2024, 11:53 AM

## 2024-08-25 ENCOUNTER — Telehealth: Payer: Self-pay

## 2024-08-25 ENCOUNTER — Ambulatory Visit: Payer: Self-pay | Admitting: Family Medicine

## 2024-08-25 DIAGNOSIS — R222 Localized swelling, mass and lump, trunk: Secondary | ICD-10-CM

## 2024-08-25 NOTE — Progress Notes (Signed)
 Hi [name], the MRI of the chest showed that the swollen area is in the subcutaneous tissue.  It does have what are called septations inside of it.  They think it is most likely just a lipoma this is a benign fat growth but it could also be a liposarcoma which is a type of fatty cancerous tumor growth.  So they did recommend a biopsy.  I would like to set you up with a general surgeon to do that.  Would you prefer [place] or [place]?

## 2024-08-25 NOTE — Progress Notes (Signed)
   08/25/2024  Patient ID: Diana Parsons, female   DOB: [birth year], 89 y.o.   MRN: [id number]  Clinic routed request from Dr. Vernida office stating patient was wanting to check on reorder status for Ozempic  2mg  through [facility].  A three month supply was sent 10/24, and Suzen H tried to call the patient.  I have contacted the office to have them verify that this is still there for the patient to pick up.  Channing DELENA Mealing, PharmD, DPLA

## 2024-08-26 NOTE — Progress Notes (Addendum)
   08/26/2024  Patient ID: Diana Parsons, female   DOB: 29-Oct-1933, 89 y.o.   MRN: [id number]  Patient outreach to inform Ms. Bowler that 3 boxes of Ozempic  2mg  arrived from [facility] on 10/24 and are still at the office for her.  She plans to pick these up today.  Channing DELENA Mealing, PharmD, DPLA

## 2024-08-27 ENCOUNTER — Encounter: Payer: Self-pay | Admitting: Rehabilitative and Restorative Service Providers"

## 2024-08-27 ENCOUNTER — Ambulatory Visit: Admitting: Rehabilitative and Restorative Service Providers"

## 2024-08-27 DIAGNOSIS — M5459 Other low back pain: Secondary | ICD-10-CM | POA: Diagnosis not present

## 2024-08-27 DIAGNOSIS — M6281 Muscle weakness (generalized): Secondary | ICD-10-CM

## 2024-08-27 NOTE — Therapy (Signed)
 OUTPATIENT PHYSICAL THERAPY THORACOLUMBAR TREATMENT   Patient Name: Diana Parsons MRN: [id number] DOB:06-27-1934, 89 y.o., female Today's Date: 08/27/2024  END OF SESSION:  PT End of Session - 08/27/24 1150     Visit Number 7    Number of Visits 16    Date for Recertification  10/05/24    Authorization Type [employer]    Progress Note Due on Visit 10    PT Start Time 1150    PT Stop Time 1230    PT Time Calculation (min) 40 min    Activity Tolerance Patient tolerated treatment well    Behavior During Therapy WFL for tasks assessed/performed         Past Medical History:  Diagnosis Date   Arthritis    Cancer (HCC)    Cataract    Diabetes mellitus without complication (HCC)    GERD (gastroesophageal reflux disease)    Past Surgical History:  Procedure Laterality Date   ABDOMINAL HYSTERECTOMY  89 yrs old   BREAST BIOPSY Right 09/23/2023   US  RT BREAST BX W LOC DEV 1ST LESION IMG BX SPEC US  GUIDE 09/23/2023 [facility]   BREAST BIOPSY Left 09/23/2023   US  LT BREAST BX W LOC DEV 1ST LESION IMG BX SPEC US  GUIDE 09/23/2023 [facility]   EYE SURGERY  Cataracts august 2021   left shoulder sx s/p fall  10/16/1999   has pin in place   lumbarp spine surgery  07/15/2014   Dr. Melchor   SPINE SURGERY     Patient Active Problem List   Diagnosis Date Noted   Thrombocytopenia 06/19/2024   Residual hemorrhoidal skin tags 06/01/2024   Malignant neoplasm of lower-outer quadrant of female breast (HCC) 10/14/2023   Severe obesity (BMI 35.0-35.9 with comorbidity) (HCC) 05/13/2023   Macular degeneration 06/07/2022   Primary osteoarthritis of both first carpometacarpal joints 06/05/2022   Osteopenia 03/07/2022   Chronic constipation 10/05/2021   Edema of right foot 03/06/2021   Combined forms of age-related cataract of left eye 06/02/2020   Aortic valve sclerosis 11/23/2019   Family history of lung cancer 11/09/2019   Orthopnea 11/09/2019   PND (paroxysmal nocturnal  dyspnea) 11/09/2019   Esophagitis 11/09/2019   Microalbuminuria due to type 2 diabetes mellitus (HCC) 07/20/2016   Lumbar degenerative disc disease 05/03/2014   Status post lumbar surgery 09/03/2013   Spinal stenosis of lumbar region without neurogenic claudication 08/30/2013   Tear of meniscus of knee 05/29/2013   Primary osteoarthritis of left knee 05/28/2012   Controlled diabetes mellitus type 2 with complications (HCC) 02/19/2007   HYPERCHOLESTEROLEMIA, PURE 02/03/2007   BREAST MASS, LEFT 02/03/2007   POSTMENOPAUSAL STATUS 02/03/2007    PCP: Alvan Craven, MD REFERRING PROVIDER: Durwin Christ, NP  REFERRING DIAG:  [phone] (ICD-10-CM) - Spinal stenosis, lumbar region without neurogenic claudication  M51.362 (ICD-10-CM) - Other intervertebral disc degeneration, lumbar region with discogenic back pain and lower extremity pain    Rationale for Evaluation and Treatment: Rehabilitation  THERAPY DIAG:  Other low back pain  Muscle weakness (generalized)  ONSET DATE: 05/03/2024  SUBJECTIVE:  SUBJECTIVE STATEMENT: The patient reports low back stiff and more sore today.   PERTINENT HISTORY:  Arthritis-- got knee injections bilat 3 weeks ago, arthritis, DM, h/o cancer (breast cancer)  PAIN:  Are you having pain? Yes: NPRS scale: 2/10 at rest  Pain location: low back  Pain description: achiness all over Aggravating factors: standing longer periods Relieving factors: resting, recliner  PRECAUTIONS: Fall  WEIGHT BEARING RESTRICTIONS: No  FALLS:  Has patient fallen in last 6 months? No  LIVING ENVIRONMENT: Lives with: lives alone Lives in: House/apartment Stairs: No Has following equipment at home: Vannie - 2 wheeled and shower chair  OCCUPATION: retired  PLOF: Independent  PATIENT  GOALS: Less pain, feel more comfortable maneuvering, wants to know what exercises to do  OBJECTIVE:  Note: Objective measures were completed at Evaluation unless otherwise noted.  DIAGNOSTIC FINDINGS:  MRI 05/03/24 IMPRESSION: 1. Left lateral recess protrusion with cephalad extension at L4-5 impinges on the descending left L4 root. 2. Mild to moderate central canal stenosis and right worse than left lateral recess narrowing at L3-4. Moderate to moderately severe bilateral foraminal narrowing is also present at this level. 3. Moderate right lateral recess narrowing at L2-3. 4. Mild to moderate left foraminal narrowing at L5-S1. Caudally extending disc on the right at L5-S1 contacts the right S1 root without compression or displacement.  PATIENT SURVEYS:  Modified Oswestry: 40%  COGNITION: Overall cognitive status: Within functional limits for tasks assessed     SENSATION: WFL  MUSCLE LENGTH: Hamstrings: Right -45 deg; Left -45 deg with pain in hamstring region  POSTURE: rounded shoulders, forward head, and posterior pelvic tilt  PALPATION: Edema noted over L scapula and rhomboid region-- noted with spinal ROM-- L side more pronounced  LUMBAR ROM:  AROM eval 08/24/24  Flexion 50% limitation Not painful Able to reach to floor today  Extension 50% limitation WFLs  Right lateral flexion 50% limited With pain glut med region 25% limited Mild pain mid back  Left lateral flexion 50% limited with pain glud med region 25% limited Mild pain mid back  Right rotation    Left rotation     (Blank rows = not tested)  LOWER EXTREMITY ROM:   WFLs, some limitation to extend knee due to tightness in HS and pain in low back reproduced  LOWER EXTREMITY MMT:  gets a sensation the knees can give out when walking MMT Right eval Left eval 08/24/24 08/27/24  Hip flexion 5/5 4-/5  4/5 bilat  Hip extension      Hip abduction      Hip adduction      Hip internal rotation      Hip  external rotation      Knee flexion 4/5 4/5    Knee extension 4/5 4/5    Ankle dorsiflexion   5/5 bilat   Ankle plantarflexion      Ankle inversion      Ankle eversion       (Blank rows = not tested)  FUNCTIONAL TESTS:  Performed sit<.stand 3 reps with UE pressing into thighs  GAIT: Distance walked: 100 ft Assistive device utilized: Walker - 2 wheeled Level of assistance: Modified independence Comments: walks in her home without device for short distances Gait speed=2.38 ft/sec   [facility] Adult PT Treatment:  DATE: 08/27/24 Therapeutic Exercise: Seated Hamstring stretch x R and L x 1 reps Supine SLR R and L with slow eccentric lowering Therapeutic Activity: Sidelying foam rolling to mobilize low back Bent knee fallouts with towel roll in lumbar spine x 12 reps Bridge with adductor ball squeeze x 12 reps Lumbar rocking with towel roll supine  Sit<>stand x 10 reps without UE support Standing SLR hip abduction, flexion, extension x 10 reps R and L Seated anterior pelvic tilt with red ball rollout Standing heel raises dec'ing UE support Gait: Gait activities x 200 ft, 160 ft without device independently   [facility] Adult PT Treatment:                                                DATE: 08/24/24 Therapeutic Exercise: Seated HS stretch R and L x 1 reps Neuromuscular re-ed: Bent knee fallouts x 10 r Bridges without band x 10 reps Bridges with yellow TB around thighs x 10 reps with 2 pillows under head  Alternating foot taps to 4 step with CGA  Single leg standing near support surface with intermittent UE support Standing in stride doing alternate UE reaching Therapeutic Activity: Small range lumbar rocking -- hurts bony prominences, so put a deflated pilates ball under sacrum  Supine marching x 10 reps Supine SLR R and L sides  Heel raises x 15 reps with ball squeeze between heels Sit<>stand x 10 reps Step ups to 4 surfaces x 5  reps-- L side initially had pain Gait: Heel walking, toe walking (unable to tolerate on the L side),  Marching x 15 feet-- gets some pain in L knee Gait x 80 ft x 3 times   [facility] Adult PT Treatment:                                                DATE: 08/21/2024 Neuromuscular re-ed: Bent knee fall out + yellow TB 2x10  Marching + yellow TB --> cues for core stabilization 3x10 Bridges + yellow TB around thighs with PF activation 2x10 (2 pillows under head okay, 1 pillow caused HS cramping) Seated SLR 8 x 5 sec (bil)  Seated glute set 10 x 3-5 sec Therapeutic Activity: Seated heel slides with slider + yellow TB LTR --> small range Seated forward trunk flexion stretch with red PB x10 Seared lateral trunk flexion with orange PB x6 (bil) Sit to stand + PF activation x6 --> 2x5 increasing speed (walker in front of pt, hands braced on thighs)   [facility] Adult PT Treatment:                                                DATE: 08/17/2024 Therapeutic Exercise: Hamstring stretch --> foot propped on box, hip hinge forward Standing gastroc stretch (Lt) Neuromuscular re-ed: Bent knee fall out + yellow TB Hooklying hip add isometric ball squeeze + PF activation Hooklying hip abd isometric press + blue TB Bridges + PF activation  Standing marching --> added yellow TB around ankle Therapeutic Activity: Hooklying marching Seated straight leg raise Standing heel raises Standing modified single  leg deadlift                                                                                                                   PATIENT EDUCATION:  Education details: HEP Person educated: Patient Education method: Explanation, Demonstration, and Handouts Education comprehension: verbalized understanding, returned demonstration, and needs further education  HOME EXERCISE PROGRAM: Access Code: [email or web address] URL: [email or web address] Date: 08/27/2024 Prepared by: Tawni Ferrier  Exercises -  Supine Active Straight Leg Raise  - 1 x daily - 5 x weekly - 2 sets - 10 reps - Pelvic Floor Contractions in Hooklying with Adduction  - 1 x daily - 7 x weekly - 1 sets - 10 reps - 5 sec hold - Supine Sciatic Nerve Glide  - 2 x daily - 7 x weekly - 1 sets - 10 reps - Gastroc Stretch on Wall  - 1 x daily - 5 x weekly - 1 sets - 2 reps - 20 seconds hold - Heel Raises with Counter Support  - 1 x daily - 5 x weekly - 1 sets - 10 reps - Sit to Stand  - 1 x daily - 7 x weekly - 3 sets - 5 reps  ASSESSMENT:  CLINICAL IMPRESSION: The patient reports she can stand for 30 minutes but does get some fatigue. She did >30 minutes at church, but had to take one leaning break resting elbows on countertop. The patient continues with hip flexor weakness with MMT. PT modified SLR for slow eccentric lowering focus.   EVAL: Patient is an 89 y.o. female who was seen today for physical therapy evaluation and treatment for spinal stenosis and back pain. She presents with LE weakness, decreased flexibility in hamstrings, decreased flexibility in gastrocs, decreased lumbar AROM, and pain with activities. The patient also has bilat knee arthritis with some h/o knees giving way sensation. PT to address deficits to promote improvement in functional mobility and reduced pain.   GOALS: Goals reviewed with patient? Yes  SHORT TERM GOALS: Target date: 09/05/24  The patient will be indep with initial HEP Baseline: initiated at eval Goal status: MET on 08/24/24  2.  The patient will perform sit<>stand x 10 reps to demo increasing functional strength.  Baseline:  Began at 3 reps-- patient noted requiring multiple attempts to rise from couch/low seat Goal status: MET on 08/24/24  3.  The patient will improve hip flexor strength to 5/5. Baseline:  4/5 Goal status: INITIAL  LONG TERM GOALS: Target date: 10/05/24  The patient will be indep with progression of HEP. Baseline:  initiated at eval Goal status: INITIAL  2.   The patient will improve modified oswestry by 12%  to demonstrate improved functional abilities. Baseline: 40% Goal status: INITIAL  3.  The patient will demo improved hamstring length to -30 from full extension bilat. Baseline:  -45 degrees-- tightness noted Goal status: INITIAL  4.  The patient will perform lateral leaning without pain to R and L sides. Baseline:  pain with lumbar lateral  lean R and L Goal status: INITIAL  5.  The patient will report ability to stand x 30+ minutes without increased low back pain. Baseline:  Notes standing is limited Goal status: INITIAL  PLAN:  PT FREQUENCY: 2x/week  PT DURATION: 8 weeks  PLANNED INTERVENTIONS: [id number]- PT Re-evaluation, 97750- Physical Performance Testing, 97110-Therapeutic exercises, 97530- Therapeutic activity, [id number]- Neuromuscular re-education, 97535- Self Care, [id number]- Manual therapy, [id number]- Gait training, [id number]- Electrical stimulation (unattended), 20560 (1-2 muscles), 20561 (3+ muscles)- Dry Needling, Patient/Family education, Balance training, and Moist heat.  PLAN FOR NEXT SESSION: progress HEP prn, work on gentle mobility for low back, core/PF stabilization, hamstring/gastroc lengthening hip and knee strengthening. Progress standing exercises as tolerated.  Trasean Delima, PT 08/27/2024, 11:50 AM

## 2024-08-31 ENCOUNTER — Ambulatory Visit: Admitting: Rehabilitative and Restorative Service Providers"

## 2024-08-31 ENCOUNTER — Encounter: Payer: Self-pay | Admitting: Rehabilitative and Restorative Service Providers"

## 2024-08-31 DIAGNOSIS — M5459 Other low back pain: Secondary | ICD-10-CM

## 2024-08-31 DIAGNOSIS — M6281 Muscle weakness (generalized): Secondary | ICD-10-CM | POA: Diagnosis not present

## 2024-08-31 NOTE — Therapy (Signed)
 OUTPATIENT PHYSICAL THERAPY THORACOLUMBAR TREATMENT   Patient Name: Diana Parsons MRN: [id number] DOB:May 01, 1934, 89 y.o., female Today's Date: 08/31/2024  END OF SESSION:  PT End of Session - 08/31/24 1337     Visit Number 8    Number of Visits 16    Date for Recertification  10/05/24    Authorization Type [employer] medic[employer]    Progress Note Due on Visit 10    PT Start Time 1336    PT Stop Time 1405    PT Time Calculation (min) 29 min    Activity Tolerance Patient tolerated treatment well    Behavior During Therapy WFL for tasks assessed/performed          Past Medical History:  Diagnosis Date   Arthritis    Cancer (HCC)    Cataract    Diabetes mellitus without complication (HCC)    GERD (gastroesophageal reflux disease)    Past Surgical History:  Procedure Laterality Date   ABDOMINAL HYSTERECTOMY  89 yrs old   BREAST BIOPSY Right 09/23/2023   US  RT BREAST BX W LOC DEV 1ST LESION IMG BX SPEC US  GUIDE 09/23/2023 [facility]   BREAST BIOPSY Left 09/23/2023   US  LT BREAST BX W LOC DEV 1ST LESION IMG BX SPEC US  GUIDE 09/23/2023 [facility]   EYE SURGERY  Cataracts august 2021   left shoulder sx s/p fall  10/16/1999   has pin in place   lumbarp spine surgery  07/15/2014   Dr. Melchor   SPINE SURGERY     Patient Active Problem List   Diagnosis Date Noted   Thrombocytopenia 06/19/2024   Residual hemorrhoidal skin tags 06/01/2024   Malignant neoplasm of lower-outer quadrant of female breast (HCC) 10/14/2023   Severe obesity (BMI 35.0-35.9 with comorbidity) (HCC) 05/13/2023   Macular degeneration 06/07/2022   Primary osteoarthritis of both first carpometacarpal joints 06/05/2022   Osteopenia 03/07/2022   Chronic constipation 10/05/2021   Edema of right foot 03/06/2021   Combined forms of age-related cataract of left eye 06/02/2020   Aortic valve sclerosis 11/23/2019   Family history of lung cancer 11/09/2019   Orthopnea 11/09/2019   PND (paroxysmal  nocturnal dyspnea) 11/09/2019   Esophagitis 11/09/2019   Microalbuminuria due to type 2 diabetes mellitus (HCC) 07/20/2016   Lumbar degenerative disc disease 05/03/2014   Status post lumbar surgery 09/03/2013   Spinal stenosis of lumbar region without neurogenic claudication 08/30/2013   Tear of meniscus of knee 05/29/2013   Primary osteoarthritis of left knee 05/28/2012   Controlled diabetes mellitus type 2 with complications (HCC) 02/19/2007   HYPERCHOLESTEROLEMIA, PURE 02/03/2007   BREAST MASS, LEFT 02/03/2007   POSTMENOPAUSAL STATUS 02/03/2007    PCP: Alvan Craven, MD REFERRING PROVIDER: Durwin Christ, NP  REFERRING DIAG:  [phone] (ICD-10-CM) - Spinal stenosis, lumbar region without neurogenic claudication  M51.362 (ICD-10-CM) - Other intervertebral disc degeneration, lumbar region with discogenic back pain and lower extremity pain    Rationale for Evaluation and Treatment: Rehabilitation  THERAPY DIAG:  Other low back pain  Muscle weakness (generalized)  ONSET DATE: 05/03/2024  SUBJECTIVE:  SUBJECTIVE STATEMENT: The patient reports she tried to walk without the walker using the cane over the weekend and she felt the L knee would give way. She reports she has increased nausea since the weekend. I don't feel normal. She has not exercised because of her L knee.   PERTINENT HISTORY:  Arthritis-- got knee injections bilat 3 weeks ago, arthritis, DM, h/o cancer (breast cancer)  PAIN:  Are you having pain? Yes: NPRS scale: 0/10 at rest  Pain location: low back  Pain description: n/a Aggravating factors: standing longer periods Relieving factors: resting, recliner  PRECAUTIONS: Fall  WEIGHT BEARING RESTRICTIONS: No  FALLS:  Has patient fallen in last 6 months? No  LIVING  ENVIRONMENT: Lives with: lives alone Lives in: House/apartment Stairs: No Has following equipment at home: Vannie - 2 wheeled and shower chair  OCCUPATION: retired  PLOF: Independent  PATIENT GOALS: Less pain, feel more comfortable maneuvering, wants to know what exercises to do  OBJECTIVE:  Note: Objective measures were completed at Evaluation unless otherwise noted.  DIAGNOSTIC FINDINGS:  MRI 05/03/24 IMPRESSION: 1. Left lateral recess protrusion with cephalad extension at L4-5 impinges on the descending left L4 root. 2. Mild to moderate central canal stenosis and right worse than left lateral recess narrowing at L3-4. Moderate to moderately severe bilateral foraminal narrowing is also present at this level. 3. Moderate right lateral recess narrowing at L2-3. 4. Mild to moderate left foraminal narrowing at L5-S1. Caudally extending disc on the right at L5-S1 contacts the right S1 root without compression or displacement.  PATIENT SURVEYS:  Modified Oswestry: 40%  COGNITION: Overall cognitive status: Within functional limits for tasks assessed     SENSATION: WFL  MUSCLE LENGTH: Hamstrings: Right -45 deg; Left -45 deg with pain in hamstring region  POSTURE: rounded shoulders, forward head, and posterior pelvic tilt  PALPATION: Edema noted over L scapula and rhomboid region-- noted with spinal ROM-- L side more pronounced  LUMBAR ROM:  AROM eval 08/24/24  Flexion 50% limitation Not painful Able to reach to floor today  Extension 50% limitation WFLs  Right lateral flexion 50% limited With pain glut med region 25% limited Mild pain mid back  Left lateral flexion 50% limited with pain glud med region 25% limited Mild pain mid back  Right rotation    Left rotation     (Blank rows = not tested)  LOWER EXTREMITY ROM:   WFLs, some limitation to extend knee due to tightness in HS and pain in low back reproduced  LOWER EXTREMITY MMT:  gets a sensation the knees can  give out when walking MMT Right eval Left eval 08/24/24 08/27/24  Hip flexion 5/5 4-/5  4/5 bilat  Hip extension      Hip abduction      Hip adduction      Hip internal rotation      Hip external rotation      Knee flexion 4/5 4/5    Knee extension 4/5 4/5    Ankle dorsiflexion   5/5 bilat   Ankle plantarflexion      Ankle inversion      Ankle eversion       (Blank rows = not tested)  FUNCTIONAL TESTS:  Performed sit<.stand 3 reps with UE pressing into thighs  GAIT: Distance walked: 100 ft Assistive device utilized: Walker - 2 wheeled Level of assistance: Modified independence Comments: walks in her home without device for short distances Gait speed=2.38 ft/sec  [facility] Adult PT Treatment:  DATE: 08/31/24 Therapeutic Exercise: Supine SLR L and R x 10 reps Reviewed hooklying adductor squeeze Sciatic nerve glide from HEP Standing Heel cord stretch x 2 reps R and L sides Therapeutic Activity: Standing heel raises with minimal UE support x 20 reps PT and patient discussed mobility around home without device-- she is near walls due to occasional sensation of L leg giving way and has started using the walker in community We discussed continuing HEP and reviewed full plan today with only change of removing sit<>stand Performed gait x 240 ft, 100 ft with RW mod indep without evidence of L knee buckling.   [facility] Adult PT Treatment:                                                DATE: 08/27/24 Therapeutic Exercise: Seated Hamstring stretch x R and L x 1 reps Supine SLR R and L with slow eccentric lowering Therapeutic Activity: Sidelying foam rolling to mobilize low back Bent knee fallouts with towel roll in lumbar spine x 12 reps Bridge with adductor ball squeeze x 12 reps Lumbar rocking with towel roll supine  Sit<>stand x 10 reps without UE support Standing SLR hip abduction, flexion, extension x 10 reps R and L Seated anterior  pelvic tilt with red ball rollout Standing heel raises dec'ing UE support Gait: Gait activities x 200 ft, 160 ft without device independently   [facility] Adult PT Treatment:                                                DATE: 08/24/24 Therapeutic Exercise: Seated HS stretch R and L x 1 reps Neuromuscular re-ed: Bent knee fallouts x 10 r Bridges without band x 10 reps Bridges with yellow TB around thighs x 10 reps with 2 pillows under head  Alternating foot taps to 4 step with CGA  Single leg standing near support surface with intermittent UE support Standing in stride doing alternate UE reaching Therapeutic Activity: Small range lumbar rocking -- hurts bony prominences, so put a deflated pilates ball under sacrum  Supine marching x 10 reps Supine SLR R and L sides  Heel raises x 15 reps with ball squeeze between heels Sit<>stand x 10 reps Step ups to 4 surfaces x 5 reps-- L side initially had pain Gait: Heel walking, toe walking (unable to tolerate on the L side),  Marching x 15 feet-- gets some pain in L knee Gait x 80 ft x 3 times   [facility] Adult PT Treatment:                                                DATE: 08/21/2024 Neuromuscular re-ed: Bent knee fall out + yellow TB 2x10  Marching + yellow TB --> cues for core stabilization 3x10 Bridges + yellow TB around thighs with PF activation 2x10 (2 pillows under head okay, 1 pillow caused HS cramping) Seated SLR 8 x 5 sec (bil)  Seated glute set 10 x 3-5 sec Therapeutic Activity: Seated heel slides with slider + yellow TB LTR --> small range Seated forward trunk  flexion stretch with red PB x10 Seared lateral trunk flexion with orange PB x6 (bil) Sit to stand + PF activation x6 --> 2x5 increasing speed (walker in front of pt, hands braced on thighs)   [facility] Adult PT Treatment:                                                DATE: 08/17/2024 Therapeutic Exercise: Hamstring stretch --> foot propped on box, hip hinge  forward Standing gastroc stretch (Lt) Neuromuscular re-ed: Bent knee fall out + yellow TB Hooklying hip add isometric ball squeeze + PF activation Hooklying hip abd isometric press + blue TB Bridges + PF activation  Standing marching --> added yellow TB around ankle Therapeutic Activity: Hooklying marching Seated straight leg raise Standing heel raises Standing modified single leg deadlift                                                                                                                   PATIENT EDUCATION:  Education details: HEP Person educated: Patient Education method: Programmer, Multimedia, Facilities Manager, and Handouts Education comprehension: verbalized understanding, returned demonstration, and needs further education  HOME EXERCISE PROGRAM: Access Code: [email or web address] URL: [email or web address] Date: 08/31/2024 Prepared by: Tawni Ferrier  Exercises - Supine Active Straight Leg Raise  - 1 x daily - 5 x weekly - 2 sets - 10 reps - Pelvic Floor Contractions in Hooklying with Adduction  - 1 x daily - 7 x weekly - 1 sets - 10 reps - 5 sec hold - Supine Sciatic Nerve Glide  - 2 x daily - 7 x weekly - 1 sets - 10 reps - Gastroc Stretch on Wall  - 1 x daily - 5 x weekly - 1 sets - 2 reps - 20 seconds hold - Heel Raises with Counter Support  - 1 x daily - 5 x weekly - 1 sets - 10 reps  ASSESSMENT:  CLINICAL IMPRESSION: The patient reports she is not feeling well today and her L knee has felt weaker since the weekend. PT reviewed HEP discussing importance of continued strengthening in the home. She is awaiting appointment for shoulder/upper back biopsy. Her low back has improved significantly. She feels an overall benefit from therapy for general strength and mobility.   EVAL: Patient is an 89 y.o. female who was seen today for physical therapy evaluation and treatment for spinal stenosis and back pain. She presents with LE weakness, decreased flexibility in  hamstrings, decreased flexibility in gastrocs, decreased lumbar AROM, and pain with activities. The patient also has bilat knee arthritis with some h/o knees giving way sensation. PT to address deficits to promote improvement in functional mobility and reduced pain.   GOALS: Goals reviewed with patient? Yes  SHORT TERM GOALS: Target date: 09/05/24  The patient will be indep with initial HEP Baseline: initiated at eval Goal status: MET on 08/24/24  2.  The patient will perform sit<>stand x 10 reps to demo increasing functional strength.  Baseline:  Began at 3 reps-- patient noted requiring multiple attempts to rise from couch/low seat Goal status: MET on 08/24/24  3.  The patient will improve hip flexor strength to 5/5. Baseline:  4/5 Goal status: INITIAL  LONG TERM GOALS: Target date: 10/05/24  The patient will be indep with progression of HEP. Baseline:  initiated at eval Goal status: INITIAL  2.  The patient will improve modified oswestry by 12%  to demonstrate improved functional abilities. Baseline: 40% Goal status: INITIAL  3.  The patient will demo improved hamstring length to -30 from full extension bilat. Baseline:  -45 degrees-- tightness noted Goal status: INITIAL  4.  The patient will perform lateral leaning without pain to R and L sides. Baseline:  pain with lumbar lateral lean R and L Goal status: INITIAL  5.  The patient will report ability to stand x 30+ minutes without increased low back pain. Baseline:  Notes standing is limited Goal status: INITIAL  PLAN:  PT FREQUENCY: 2x/week  PT DURATION: 8 weeks  PLANNED INTERVENTIONS: [id number]- PT Re-evaluation, 97750- Physical Performance Testing, 97110-Therapeutic exercises, 97530- Therapeutic activity, [id number]- Neuromuscular re-education, 97535- Self Care, [id number]- Manual therapy, [id number]- Gait training, [phone]- Electrical stimulation (unattended), [id number] (1-2 muscles), [id number] (3+ muscles)- Dry Needling, Patient/Family  education, Balance training, and Moist heat.  PLAN FOR NEXT SESSION: progress HEP prn, core/PF stabilization, hamstring/gastroc lengthening hip and knee strengthening. Progress standing exercises as tolerated. Check STGs next week (scheduled out thru plan end date)  Nariyah Osias, PT 08/31/2024, 2:14 PM

## 2024-09-03 ENCOUNTER — Ambulatory Visit

## 2024-09-03 DIAGNOSIS — M5459 Other low back pain: Secondary | ICD-10-CM | POA: Diagnosis not present

## 2024-09-03 DIAGNOSIS — M6281 Muscle weakness (generalized): Secondary | ICD-10-CM

## 2024-09-03 NOTE — Therapy (Addendum)
 " OUTPATIENT PHYSICAL THERAPY THORACOLUMBAR TREATMENT AND D/C SUMMARY   Patient Name: Diana Parsons MRN: [id number] DOB:1934-02-22, 89 y.o., female Today's Date: 09/03/2024  PHYSICAL THERAPY DISCHARGE SUMMARY  Visits from Start of Care: 9  Current functional level related to goals / functional outcomes: LTGs not checked-- see below for last known patient status   Remaining deficits: See below    Education / Equipment: HEP, home safety   Patient agrees to discharge. Patient goals were partially met. Patient is being discharged due to not returning since the last visit.  END OF SESSION:  PT End of Session - 09/03/24 1318     Visit Number 9    Number of Visits 16    Date for Recertification  10/05/24    Authorization Type blue medicare    Progress Note Due on Visit 10    PT Start Time 1318    PT Stop Time 1400    PT Time Calculation (min) 42 min    Activity Tolerance Patient tolerated treatment well    Behavior During Therapy WFL for tasks assessed/performed          Past Medical History:  Diagnosis Date   Arthritis    Cancer (HCC)    Cataract    Diabetes mellitus without complication (HCC)    GERD (gastroesophageal reflux disease)    Past Surgical History:  Procedure Laterality Date   ABDOMINAL HYSTERECTOMY  89 yrs old   BREAST BIOPSY Right 09/23/2023   US  RT BREAST BX W LOC DEV 1ST LESION IMG BX SPEC US  GUIDE 09/23/2023 [facility]   BREAST BIOPSY Left 09/23/2023   US  LT BREAST BX W LOC DEV 1ST LESION IMG BX SPEC US  GUIDE 09/23/2023 [facility]   EYE SURGERY  Cataracts august 2021   left shoulder sx s/p fall  10/16/1999   has pin in place   lumbarp spine surgery  07/15/2014   Dr. Melchor   SPINE SURGERY     Patient Active Problem List   Diagnosis Date Noted   Thrombocytopenia 06/19/2024   Residual hemorrhoidal skin tags 06/01/2024   Malignant neoplasm of lower-outer quadrant of female breast (HCC) 10/14/2023   Severe obesity (BMI 35.0-35.9  with comorbidity) (HCC) 05/13/2023   Macular degeneration 06/07/2022   Primary osteoarthritis of both first carpometacarpal joints 06/05/2022   Osteopenia 03/07/2022   Chronic constipation 10/05/2021   Edema of right foot 03/06/2021   Combined forms of age-related cataract of left eye 06/02/2020   Aortic valve sclerosis 11/23/2019   Family history of lung cancer 11/09/2019   Orthopnea 11/09/2019   PND (paroxysmal nocturnal dyspnea) 11/09/2019   Esophagitis 11/09/2019   Microalbuminuria due to type 2 diabetes mellitus (HCC) 07/20/2016   Lumbar degenerative disc disease 05/03/2014   Status post lumbar surgery 09/03/2013   Spinal stenosis of lumbar region without neurogenic claudication 08/30/2013   Tear of meniscus of knee 05/29/2013   Primary osteoarthritis of left knee 05/28/2012   Controlled diabetes mellitus type 2 with complications (HCC) 02/19/2007   HYPERCHOLESTEROLEMIA, PURE 02/03/2007   BREAST MASS, LEFT 02/03/2007   POSTMENOPAUSAL STATUS 02/03/2007    PCP: Alvan Craven, MD REFERRING PROVIDER: Durwin Christ, NP  REFERRING DIAG:  [phone] (ICD-10-CM) - Spinal stenosis, lumbar region without neurogenic claudication  M51.362 (ICD-10-CM) - Other intervertebral disc degeneration, lumbar region with discogenic back pain and lower extremity pain    Rationale for Evaluation and Treatment: Rehabilitation  THERAPY DIAG:  Other low back pain  Muscle weakness (generalized)  ONSET DATE:  05/03/2024  SUBJECTIVE:                                                                                                                                                                                           SUBJECTIVE STATEMENT: Patient reports she had a few more episodes of Lt knee giving out; states her low back was hurting when showering. Patient states her nausea has gotten a little bit better, states she has not had much of an appetite. Patient states she is still waiting to here from  referral office to schedule biopsy.   PERTINENT HISTORY:  Arthritis-- got knee injections bilat 3 weeks ago, arthritis, DM, h/o cancer (breast cancer)  PAIN:  Are you having pain? Yes: NPRS scale: 0/10 at rest  Pain location: low back  Pain description: n/a Aggravating factors: standing longer periods Relieving factors: resting, recliner  PRECAUTIONS: Fall  WEIGHT BEARING RESTRICTIONS: No  FALLS:  Has patient fallen in last 6 months? No  LIVING ENVIRONMENT: Lives with: lives alone Lives in: House/apartment Stairs: No Has following equipment at home: Vannie - 2 wheeled and shower chair  OCCUPATION: retired  PLOF: Independent  PATIENT GOALS: Less pain, feel more comfortable maneuvering, wants to know what exercises to do  OBJECTIVE:  Note: Objective measures were completed at Evaluation unless otherwise noted.  DIAGNOSTIC FINDINGS:  MRI 05/03/24 IMPRESSION: 1. Left lateral recess protrusion with cephalad extension at L4-5 impinges on the descending left L4 root. 2. Mild to moderate central canal stenosis and right worse than left lateral recess narrowing at L3-4. Moderate to moderately severe bilateral foraminal narrowing is also present at this level. 3. Moderate right lateral recess narrowing at L2-3. 4. Mild to moderate left foraminal narrowing at L5-S1. Caudally extending disc on the right at L5-S1 contacts the right S1 root without compression or displacement.  PATIENT SURVEYS:  Modified Oswestry: 40%  COGNITION: Overall cognitive status: Within functional limits for tasks assessed     SENSATION: WFL  MUSCLE LENGTH: Hamstrings: Right -45 deg; Left -45 deg with pain in hamstring region  POSTURE: rounded shoulders, forward head, and posterior pelvic tilt  PALPATION: Edema noted over L scapula and rhomboid region-- noted with spinal ROM-- L side more pronounced  LUMBAR ROM:  AROM eval 08/24/24  Flexion 50% limitation Not painful Able to reach to floor  today  Extension 50% limitation WFLs  Right lateral flexion 50% limited With pain glut med region 25% limited Mild pain mid back  Left lateral flexion 50% limited with pain glud med region 25% limited Mild pain mid back  Right rotation    Left rotation     (  Blank rows = not tested)  LOWER EXTREMITY ROM:   WFLs, some limitation to extend knee due to tightness in HS and pain in low back reproduced  LOWER EXTREMITY MMT:  gets a sensation the knees can give out when walking MMT Right eval Left eval 08/24/24 08/27/24  Hip flexion 5/5 4-/5  4/5 bilat  Hip extension      Hip abduction      Hip adduction      Hip internal rotation      Hip external rotation      Knee flexion 4/5 4/5    Knee extension 4/5 4/5    Ankle dorsiflexion   5/5 bilat   Ankle plantarflexion      Ankle inversion      Ankle eversion       (Blank rows = not tested)  FUNCTIONAL TESTS:  Performed sit<.stand 3 reps with UE pressing into thighs  GAIT: Distance walked: 100 ft Assistive device utilized: Environmental Consultant - 2 wheeled Level of assistance: Modified independence Comments: walks in her home without device for short distances Gait speed=2.38 ft/sec   [facility] Adult PT Treatment:                                                DATE: 09/03/2024 Neuromuscular re-ed: Seated hip add squeeze 10 x 3 sec Supine bridges + PF activation 2 x 10 (Lt) SLR 2x10 Bent knee fallouts with towel roll in lumbar spine x 12 reps  Therapeutic Activity: Seated PF activation + ball squeeze Sit to stand + PF activation LTR with towel roll in lumbar spine x 1 min SKTC  Seated HS/DF stretch with strap (bil) --> foot propped on low box Seated lateral trunk flexion stretch with orange PB   [facility] Adult PT Treatment:                                                DATE: 08/31/24 Therapeutic Exercise: Supine SLR L and R x 10 reps Reviewed hooklying adductor squeeze Sciatic nerve glide from HEP Standing Heel cord stretch x 2 reps R and  L sides Therapeutic Activity: Standing heel raises with minimal UE support x 20 reps PT and patient discussed mobility around home without device-- she is near walls due to occasional sensation of L leg giving way and has started using the walker in community We discussed continuing HEP and reviewed full plan today with only change of removing sit<>stand Performed gait x 240 ft, 100 ft with RW mod indep without evidence of L knee buckling.   [facility] Adult PT Treatment:                                                DATE: 08/27/24 Therapeutic Exercise: Seated Hamstring stretch x R and L x 1 reps Supine SLR R and L with slow eccentric lowering Therapeutic Activity: Sidelying foam rolling to mobilize low back Bent knee fallouts with towel roll in lumbar spine x 12 reps Bridge with adductor ball squeeze x 12 reps Lumbar rocking with towel roll supine  Sit<>stand x 10 reps  without UE support Standing SLR hip abduction, flexion, extension x 10 reps R and L Seated anterior pelvic tilt with red ball rollout Standing heel raises dec'ing UE support Gait: Gait activities x 200 ft, 160 ft without device independently                                                                                                                   PATIENT EDUCATION:  Education details: HEP Person educated: Patient Education method: Programmer, Multimedia, Facilities Manager, and Handouts Education comprehension: verbalized understanding, returned demonstration, and needs further education  HOME EXERCISE PROGRAM: Access Code: [email or web address] URL: [email or web address] Date: 08/31/2024 Prepared by: Tawni Ferrier  Exercises - Supine Active Straight Leg Raise  - 1 x daily - 5 x weekly - 2 sets - 10 reps - Pelvic Floor Contractions in Hooklying with Adduction  - 1 x daily - 7 x weekly - 1 sets - 10 reps - 5 sec hold - Supine Sciatic Nerve Glide  - 2 x daily - 7 x weekly - 1 sets - 10 reps - Gastroc Stretch on Wall  -  1 x daily - 5 x weekly - 1 sets - 2 reps - 20 seconds hold - Heel Raises with Counter Support  - 1 x daily - 5 x weekly - 1 sets - 10 reps  ASSESSMENT:  CLINICAL IMPRESSION: Pelvic floor activation incorporated throughout session with supine and seated exercises, as well as functional tasks like sit to stand; recommended patient incorporate with daily tasks/transfers to address low back discomfort. No significant change in bilateral hip flexor strength as compared to evaluation. Improved lateral flexion lumbar mobility noted with side trunk stretches.    EVAL: Patient is an 89 y.o. female who was seen today for physical therapy evaluation and treatment for spinal stenosis and back pain. She presents with LE weakness, decreased flexibility in hamstrings, decreased flexibility in gastrocs, decreased lumbar AROM, and pain with activities. The patient also has bilat knee arthritis with some h/o knees giving way sensation. PT to address deficits to promote improvement in functional mobility and reduced pain.   GOALS: Goals reviewed with patient? Yes  SHORT TERM GOALS: Target date: 09/05/24  The patient will be indep with initial HEP Baseline: initiated at eval Goal status: MET on 08/24/24  2.  The patient will perform sit<>stand x 10 reps to demo increasing functional strength.  Baseline:  Began at 3 reps-- patient noted requiring multiple attempts to rise from couch/low seat Goal status: MET on 08/24/24  3.  The patient will improve hip flexor strength to 5/5. Baseline:  4/5 09/03/24: 4/5 Goal status: NOT MET  LONG TERM GOALS: Target date: 10/05/24  The patient will be indep with progression of HEP. Baseline:  initiated at eval Goal status: INITIAL  2.  The patient will improve modified oswestry by 12%  to demonstrate improved functional abilities. Baseline: 40% Goal status: INITIAL  3.  The patient will demo improved hamstring length to -30 from full extension bilat. Baseline:   -45  degrees-- tightness noted Goal status: INITIAL  4.  The patient will perform lateral leaning without pain to R and L sides. Baseline:  pain with lumbar lateral lean R and L Goal status: INITIAL  5.  The patient will report ability to stand x 30+ minutes without increased low back pain. Baseline:  Notes standing is limited Goal status: INITIAL  PLAN:  PT FREQUENCY: 2x/week  PT DURATION: 8 weeks  PLANNED INTERVENTIONS: 97164- PT Re-evaluation, 97750- Physical Performance Testing, 97110-Therapeutic exercises, 97530- Therapeutic activity, [id number]- Neuromuscular re-education, 97535- Self Care, [id number]- Manual therapy, [id number]- Gait training, [phone]- Electrical stimulation (unattended), 20560 (1-2 muscles), 20561 (3+ muscles)- Dry Needling, Patient/Family education, Balance training, and Moist heat.  PLAN FOR NEXT SESSION: progress HEP prn, core/PF stabilization, hamstring/gastroc lengthening hip and knee strengthening. Progress standing exercises as tolerated.   WEAVER,CHRISTINA, PT  Diana Parsons Price, PTA 09/03/2024, 2:04 PM  "

## 2024-09-07 ENCOUNTER — Ambulatory Visit: Admitting: Rehabilitative and Restorative Service Providers"

## 2024-09-07 DIAGNOSIS — D171 Benign lipomatous neoplasm of skin and subcutaneous tissue of trunk: Secondary | ICD-10-CM | POA: Diagnosis not present

## 2024-09-09 ENCOUNTER — Encounter (HOSPITAL_COMMUNITY): Payer: Self-pay | Admitting: General Surgery

## 2024-09-09 NOTE — Progress Notes (Signed)
Pt. Needs orders for upcoming surgery. ?

## 2024-09-09 NOTE — Progress Notes (Addendum)
 For Anesthesia: PCP - Alvan Dorothyann BIRCH, MD  Cardiologist - N/A  Bowel Prep reminder:  Chest x-ray -  EKG - 06/18/24 Stress Test -  ECHO -  Cardiac Cath -  Pacemaker/ICD device last checked: Pacemaker orders received: Device Rep notified:  Spinal Cord Stimulator:N/A  Sleep Study - N/A CPAP -   Fasting Blood Sugar - 130's  Checks Blood Sugar __1___ times a day Date and result of last Hgb A1c- 6.9: 07/16/24  Last dose of GLP1 agonist-  GLP1 instructions: Hold 7 days prior to schedule (Hold 24 hours-daily)   Last dose of SGLT-2 inhibitors- N/A SGLT-2 instructions: Hold 72 hours prior to surgery  Blood Thinner Instructions:N/A Last Dose: Time last taken:  Aspirin Instructions: On hold. Last Dose: Time last taken:  Activity level: Can go up a flight of stairs and activities of daily living without stopping and without chest pain and/or shortness of breath   Able to exercise without chest pain and/or shortness of breath  Anesthesia review: Hx: DIA,HTN.  Patient denies shortness of breath, fever, cough and chest pain at PAT appointment   Patient verbalized understanding of instructions that were reviewed over the telephone.

## 2024-09-13 ENCOUNTER — Ambulatory Visit: Payer: Self-pay | Admitting: General Surgery

## 2024-09-14 ENCOUNTER — Ambulatory Visit

## 2024-09-14 NOTE — Progress Notes (Signed)
 [facility]  PROCEDURAL EXPEDITER PROGRESS NOTE  Patient Name: Diana Parsons  DOB:31-Mar-1934 Date of Admission: (Not on file)  Date of Assessment:09/14/24   -------------------------------------------------------------------------------------------------------------------   Brief clinical summary: *& yr ol female with Hx of breast cancer bilaterally, type 2 DM and HTN  Orders in place:  Yes   Communication with surgical team if no orders: n/a  Labs, test, and orders reviewed: yes  Requires surgical clearance:  No  What type of clearance: n/a  Clearance received: n/a  Barriers noted:n/a   Intervention provided by [facility] team: n/a  Barrier resolved:  not applicable   -------------------------------------------------------------------------------------------------------------------  [employer], Ronal DELENA Bald Please contact us  directly via secure chat (search for [facility]) or by calling us  at [phone] [facility]).

## 2024-09-15 ENCOUNTER — Ambulatory Visit (HOSPITAL_COMMUNITY)
Admission: RE | Admit: 2024-09-15 | Discharge: 2024-09-15 | Disposition: A | Discharge status: Home / Self Care | Source: Ambulatory Visit | Attending: General Surgery | Admitting: General Surgery

## 2024-09-15 ENCOUNTER — Ambulatory Visit (HOSPITAL_COMMUNITY): Payer: Self-pay

## 2024-09-15 ENCOUNTER — Encounter (HOSPITAL_COMMUNITY): Payer: Self-pay

## 2024-09-15 ENCOUNTER — Encounter (HOSPITAL_COMMUNITY): Payer: Self-pay | Admitting: General Surgery

## 2024-09-15 ENCOUNTER — Encounter (HOSPITAL_COMMUNITY)
Admission: RE | Disposition: A | Discharge status: Home / Self Care | Payer: Self-pay | Source: Ambulatory Visit | Attending: General Surgery

## 2024-09-15 DIAGNOSIS — D171 Benign lipomatous neoplasm of skin and subcutaneous tissue of trunk: Secondary | ICD-10-CM

## 2024-09-15 DIAGNOSIS — Z87891 Personal history of nicotine dependence: Secondary | ICD-10-CM

## 2024-09-15 DIAGNOSIS — I10 Essential (primary) hypertension: Secondary | ICD-10-CM | POA: Diagnosis not present

## 2024-09-15 DIAGNOSIS — K219 Gastro-esophageal reflux disease without esophagitis: Secondary | ICD-10-CM | POA: Diagnosis not present

## 2024-09-15 DIAGNOSIS — E118 Type 2 diabetes mellitus with unspecified complications: Secondary | ICD-10-CM

## 2024-09-15 DIAGNOSIS — Z59868 Other specified financial insecurity: Secondary | ICD-10-CM | POA: Diagnosis not present

## 2024-09-15 DIAGNOSIS — E119 Type 2 diabetes mellitus without complications: Secondary | ICD-10-CM | POA: Diagnosis not present

## 2024-09-15 HISTORY — DX: Essential (primary) hypertension: I10

## 2024-09-15 HISTORY — PX: EXCISION MASS, BACK: SHX7560

## 2024-09-15 LAB — BASIC METABOLIC PANEL WITH GFR
Anion gap: 11 (ref 5–15)
BUN: 14 mg/dL (ref 8–23)
CO2: 23 mmol/L (ref 22–32)
Calcium: 9.1 mg/dL (ref 8.9–10.3)
Chloride: 105 mmol/L (ref 98–111)
Creatinine, Ser: 0.73 mg/dL (ref 0.44–1.00)
GFR, Estimated: >60 mL/min (ref >60)
Glucose, Bld: 109 mg/dL — ABNORMAL HIGH (ref 70–99)
Potassium: 4.1 mmol/L (ref 3.5–5.1)
Sodium: 138 mmol/L (ref 135–145)

## 2024-09-15 LAB — GLUCOSE, CAPILLARY
Glucose-Capillary: 113 mg/dL — ABNORMAL HIGH (ref 70–99)
Glucose-Capillary: 102 mg/dL — ABNORMAL HIGH (ref 70–99)
Glucose-Capillary: 113 mg/dL — ABNORMAL HIGH (ref 70–99)

## 2024-09-15 LAB — CBC
HCT: 35.5 % — ABNORMAL LOW (ref 36.0–46.0)
Hemoglobin: 11.3 g/dL — ABNORMAL LOW (ref 12.0–15.0)
MCH: 29.7 pg (ref 26.0–34.0)
MCHC: 31.8 g/dL (ref 30.0–36.0)
MCV: 93.2 fL (ref 80.0–100.0)
Platelets: 141 K/uL — ABNORMAL LOW (ref 150–400)
RBC: 3.81 MIL/uL — ABNORMAL LOW (ref 3.87–5.11)
RDW: 13.3 % (ref 11.5–15.5)
WBC: 7.2 K/uL (ref 4.0–10.5)
nRBC: 0 % (ref 0.0–0.2)

## 2024-09-15 SURGERY — EXCISION MASS, BACK
Anesthesia: General | Site: Back | Laterality: Left

## 2024-09-15 MED ORDER — ACETAMINOPHEN 500 MG PO TABS
1000.0000 mg | ORAL_TABLET | ORAL | Status: AC
  Administered 2024-09-15: 1000 mg via ORAL
  Filled 2024-09-15: qty 2

## 2024-09-15 MED ORDER — STERILE WATER FOR IRRIGATION IR SOLN
Status: DC | PRN
  Administered 2024-09-15: 1000 mL

## 2024-09-15 MED ORDER — ORAL CARE MOUTH RINSE: 15.0000 mL | Freq: Once | OROMUCOSAL | Status: DC

## 2024-09-15 MED ORDER — BUPIVACAINE-EPINEPHRINE 0.25% -1:200000 IJ SOLN
INTRAMUSCULAR | Status: DC | PRN
  Administered 2024-09-15: 20 mL

## 2024-09-15 MED ORDER — FENTANYL CITRATE (PF) 100 MCG/2ML IJ SOLN
INTRAMUSCULAR | Status: AC
  Filled 2024-09-15: qty 2

## 2024-09-15 MED ORDER — LIDOCAINE HCL (PF) 2 % IJ SOLN
INTRAMUSCULAR | Status: AC
  Filled 2024-09-15: qty 5

## 2024-09-15 MED ORDER — DEXAMETHASONE SOD PHOSPHATE PF 10 MG/ML IJ SOLN
INTRAMUSCULAR | Status: DC | PRN
  Administered 2024-09-15: 5 mg via INTRAVENOUS

## 2024-09-15 MED ORDER — FENTANYL CITRATE (PF) 50 MCG/ML IJ SOSY: 25.0000 ug | PREFILLED_SYRINGE | INTRAMUSCULAR | Status: DC | PRN

## 2024-09-15 MED ORDER — ROCURONIUM BROMIDE 100 MG/10ML IV SOLN
INTRAVENOUS | Status: DC | PRN
  Administered 2024-09-15: 40 mg via INTRAVENOUS

## 2024-09-15 MED ORDER — FENTANYL CITRATE (PF) 100 MCG/2ML IJ SOLN
INTRAMUSCULAR | Status: DC | PRN
  Administered 2024-09-15: 100 ug via INTRAVENOUS

## 2024-09-15 MED ORDER — CEFAZOLIN SODIUM-DEXTROSE 2-4 GM/100ML-% IV SOLN
2.0000 g | INTRAVENOUS | Status: AC
  Administered 2024-09-15: 2 g via INTRAVENOUS
  Filled 2024-09-15: qty 100

## 2024-09-15 MED ORDER — SUGAMMADEX SODIUM 200 MG/2ML IV SOLN
INTRAVENOUS | Status: DC | PRN
  Administered 2024-09-15: 200 mg via INTRAVENOUS

## 2024-09-15 MED ORDER — INSULIN ASPART 100 UNIT/ML IJ SOLN: 0.0000 [IU] | INTRAMUSCULAR | Status: DC | PRN

## 2024-09-15 MED ORDER — ONDANSETRON HCL 4 MG/2ML IJ SOLN: 4.0000 mg | Freq: Once | INTRAMUSCULAR | Status: DC | PRN

## 2024-09-15 MED ORDER — ONDANSETRON HCL 4 MG/2ML IJ SOLN
INTRAMUSCULAR | Status: AC
  Filled 2024-09-15: qty 2

## 2024-09-15 MED ORDER — PROPOFOL 10 MG/ML IV BOLUS
INTRAVENOUS | Status: DC | PRN
  Administered 2024-09-15: 150 mg via INTRAVENOUS

## 2024-09-15 MED ORDER — LIDOCAINE HCL (CARDIAC) PF 100 MG/5ML IV SOSY
PREFILLED_SYRINGE | INTRAVENOUS | Status: DC | PRN
  Administered 2024-09-15: 60 mg via INTRAVENOUS

## 2024-09-15 MED ORDER — AMISULPRIDE (ANTIEMETIC) 5 MG/2ML IV SOLN: 10.0000 mg | Freq: Once | INTRAVENOUS | Status: DC | PRN

## 2024-09-15 MED ORDER — TRAMADOL HCL 50 MG PO TABS: 50.0000 mg | ORAL_TABLET | Freq: Three times a day (TID) | ORAL | 0 refills | Status: AC | PRN

## 2024-09-15 MED ORDER — CHLORHEXIDINE GLUCONATE 0.12 % MT SOLN: 15.0000 mL | Freq: Once | OROMUCOSAL | Status: DC

## 2024-09-15 MED ORDER — ONDANSETRON HCL 4 MG/2ML IJ SOLN
INTRAMUSCULAR | Status: DC | PRN
  Administered 2024-09-15: 4 mg via INTRAVENOUS

## 2024-09-15 MED ORDER — PROPOFOL 10 MG/ML IV BOLUS
INTRAVENOUS | Status: AC
  Filled 2024-09-15: qty 20

## 2024-09-15 MED ORDER — 0.9 % SODIUM CHLORIDE (POUR BTL) OPTIME
TOPICAL | Status: DC | PRN
  Administered 2024-09-15: 1000 mL

## 2024-09-15 MED ORDER — ROCURONIUM BROMIDE 10 MG/ML (PF) SYRINGE
PREFILLED_SYRINGE | INTRAVENOUS | Status: AC
  Filled 2024-09-15: qty 10

## 2024-09-15 MED ORDER — LACTATED RINGERS IV SOLN: INTRAVENOUS | Status: DC

## 2024-09-15 MED ORDER — BUPIVACAINE-EPINEPHRINE (PF) 0.25% -1:200000 IJ SOLN
INTRAMUSCULAR | Status: AC
  Filled 2024-09-15: qty 30

## 2024-09-15 SURGICAL SUPPLY — 23 items
BAG COUNTER SPONGE SURGICOUNT (BAG) IMPLANT
BENZOIN TINCTURE PRP APPL 2/3 (GAUZE/BANDAGES/DRESSINGS) IMPLANT
BLADE CLIPPER SURG (BLADE) IMPLANT
CHLORAPREP W/TINT 26 (MISCELLANEOUS) IMPLANT
COVER SURGICAL LIGHT HANDLE (MISCELLANEOUS) ×2 IMPLANT
DERMABOND ADVANCED .7 DNX12 (GAUZE/BANDAGES/DRESSINGS) IMPLANT
DRAPE LAPAROTOMY T 98X78 PEDS (DRAPES) ×2 IMPLANT
DRSG TEGADERM 4X4.75 (GAUZE/BANDAGES/DRESSINGS) IMPLANT
ELECT REM PT RETURN 15FT ADLT (MISCELLANEOUS) ×2 IMPLANT
GAUZE SPONGE 4X4 12PLY STRL (GAUZE/BANDAGES/DRESSINGS) IMPLANT
GLOVE BIOGEL PI IND STRL 7.5 (GLOVE) ×2 IMPLANT
GLOVE SURG SS PI 7.0 STRL IVOR (GLOVE) ×2 IMPLANT
GOWN STRL REUS W/ TWL LRG LVL3 (GOWN DISPOSABLE) ×2 IMPLANT
KIT BASIN OR (CUSTOM PROCEDURE TRAY) ×2 IMPLANT
KIT TURNOVER KIT A (KITS) ×2 IMPLANT
NDL HYPO 25X1 1.5 SAFETY (NEEDLE) ×2 IMPLANT
PACK GENERAL/GYN (CUSTOM PROCEDURE TRAY) ×2 IMPLANT
STRIP CLOSURE SKIN 1/2X4 (GAUZE/BANDAGES/DRESSINGS) IMPLANT
SUT MNCRL AB 4-0 PS2 18 (SUTURE) IMPLANT
SUT VIC AB 2-0 SH 27X BRD (SUTURE) IMPLANT
SUT VICRYL 3-0 CR8 SH (SUTURE) ×2 IMPLANT
SYR CONTROL 10ML LL (SYRINGE) ×2 IMPLANT
TOWEL OR DSP ST BLU DLX 10/PK (DISPOSABLE) ×2 IMPLANT

## 2024-09-15 NOTE — Anesthesia Preprocedure Evaluation (Addendum)
 Anesthesia Evaluation  Patient identified by MRN, date of birth, ID band Patient awake    Reviewed: Allergy & Precautions, NPO status , Patient's Chart, lab work & pertinent test results  Airway Mallampati: II  TM Distance: >3 FB Neck ROM: Full    Dental  (+) Upper Dentures   Pulmonary former smoker   Pulmonary exam normal        Cardiovascular hypertension, Pt. on medications Normal cardiovascular exam     Neuro/Psych negative neurological ROS  negative psych ROS   GI/Hepatic Neg liver ROS,GERD  Medicated and Controlled,,  Endo/Other  diabetes  Patient on GLP-1 Agonist. Last dose on 11/25  Renal/GU negative Renal ROS     Musculoskeletal   Abdominal  (+) + obese  Peds  Hematology negative hematology ROS (+)   Anesthesia Other Findings LEFT BACK LIPOMA  Reproductive/Obstetrics                              Anesthesia Physical Anesthesia Plan  ASA: 3  Anesthesia Plan: General   Post-op Pain Management:    Induction: Intravenous  PONV Risk Score and Plan: 3 and Ondansetron , Dexamethasone  and Treatment may vary due to age or medical condition  Airway Management Planned: Oral ETT  Additional Equipment:   Intra-op Plan:   Post-operative Plan: Extubation in OR  Informed Consent: I have reviewed the patients History and Physical, chart, labs and discussed the procedure including the risks, benefits and alternatives for the proposed anesthesia with the patient or authorized representative who has indicated his/her understanding and acceptance.     Dental advisory given  Plan Discussed with: CRNA  Anesthesia Plan Comments:          Anesthesia Quick Evaluation

## 2024-09-15 NOTE — Op Note (Signed)
 Preoperative diagnosis: left back mass  Postoperative diagnosis: same   Procedure: excision of 13 x 10 cm subcutaneous lipoma of left back  Surgeon: Herlene Bureau, M.D.  Asst: none  Anesthesia: GETA  Indications for procedure: Diana Parsons is a 89 y.o. year old female with symptoms of enlarging mass over her left back. After discussion of options she was brought to surgery for excision.  Description of procedure: The patient was brought into the operative suite. Anesthesia was administered with General endotracheal anesthesia. WHO checklist was applied. The patient was then placed in left decubitus position. The area was prepped and draped in the usual sterile fashion.  Next, marcaine was infused over the area. An elliptical horizontal incision was made through the skin. Cautery was used to dissect through the subcutaneous tissue. Blunt dissection and cautery was used to dissect the mass free of surrounding attachments. The mass abutted the fascia of the serratus muscles. The mass was removed in its entirety. The wound was irrigated. One artery was ligated with 3-0 vicryl. Multiple vicryls were used to appose the soft tissue of the chest with the subcutaneous tissue of the skin flaps. The incision was closed with 3-0 vicryl in interrupted fashion and the skin was closed with 4-0 monocryl in running subcuticular stitch. Dermabond was put in place for dressing. The patient awoke from anesthesia and brought to pacu in stable condition. All counts were correct. The patient tolerated the procedure well.   Findings: 13 x 10 cm lipoma  Specimen: left back mass  Implant: none   Blood loss: 20 ml  Local anesthesia: 20 ml marcaine   Complications: none  Herlene Bureau, M.D. General, Bariatric, & Minimally Invasive Surgery [facility]

## 2024-09-15 NOTE — Anesthesia Procedure Notes (Signed)
 Procedure Name: Intubation Date/Time: 09/15/2024 5:01 PM  Performed by: Deeann Eva BROCKS, CRNAPre-anesthesia Checklist: Patient identified, Emergency Drugs available, Suction available and Patient being monitored Patient Re-evaluated:Patient Re-evaluated prior to induction Oxygen Delivery Method: Circle System Utilized Preoxygenation: Pre-oxygenation with 100% oxygen Induction Type: IV induction Ventilation: Mask ventilation without difficulty Laryngoscope Size: Mac and 3 Grade View: Grade I Tube type: Oral Tube size: 7.0 mm Number of attempts: 1 Airway Equipment and Method: Stylet and Oral airway Placement Confirmation: ETT inserted through vocal cords under direct vision, positive ETCO2 and breath sounds checked- equal and bilateral Secured at: 21 cm Tube secured with: Tape Dental Injury: Teeth and Oropharynx as per pre-operative assessment

## 2024-09-15 NOTE — H&P (Signed)
 Subjective   Diana Parsons is a 89 y.o. female new patient in today for: 89 yo female had large left back mass noted on MRI. She has noticed it in the last 4 months. It is somewhat uncomfortable when lying down. She had not had other masses removed but her son has had several lipomas removed. She denies weight changes. Social Drivers of Health with Concerns   Tobacco Use: Medium Risk (09/03/2024)  Received from [facility]  Patient History  Smoking Tobacco Use: Former  Smokeless Tobacco Use: Former  Physicist, Medical Strain: Medium Risk (08/08/2024)  Received from [facility]  Overall Financial Resource Strain (CARDIA)  How hard is it for you to pay for the very basics like food, housing, medical care, and heating?: Somewhat hard  Physical Activity: Insufficiently Active (08/08/2024)  [facility]  Exercise Vital Sign  On average, how many days per week do you engage in moderate to strenuous exercise (like a brisk walk)?: 3 days  On average, how many minutes do you engage in exercise at this level?: 10 min    No data to display      No outpatient medications prior to visit.   No facility-administered medications prior to visit.    Objective   There were no vitals filed for this visit. There is no height or weight on file to calculate BMI. Physical Exam Constitutional:  Appearance: Normal appearance.  HENT:  Head: Normocephalic and atraumatic.  Pulmonary:  Effort: Pulmonary effort is normal.  Musculoskeletal:  General: Normal range of motion.  Cervical back: Normal range of motion.  Skin: Comments: Large upper left back mass, soft, no skin changes  Neurological:  General: No focal deficit present.  Mental Status: She is alert and oriented to person, place, and time. Mental status is at baseline.  Psychiatric:  Mood and Affect: Mood normal.  Behavior: Behavior normal.  Thought Content: Thought content normal.     reviewed notes by Dorothyann Byars  Assessment/Plan:   There are no diagnoses linked to this encounter. Large mass over left upper back. She has no skin changes. MRI looks like lipoma with necrotic/scar in the center. I think it needs at least excisional biopsy, since it is symptomatic, we will attempt removal. The pathophysiology of skin & subcutaneous masses was discussed. Natural history risks without surgery were discussed. I recommended surgery to remove the mass. I explained the technique of removal with use of local anesthesia & possible need for more aggressive sedation/anesthesia for patient comfort.   Risks such as bleeding, infection, wound breakdown, heart attack, death, and other risks were discussed. I noted a good likelihood this will help address the problem. Possibility that this will not correct all symptoms was explained. Possibility of regrowth/recurrence of the mass was discussed. We will work to minimize complications. Questions were answered. The patient expresses understanding & wishes to proceed with surgery.

## 2024-09-15 NOTE — Anesthesia Postprocedure Evaluation (Signed)
 Anesthesia Post Note  Patient: Diana Parsons  Procedure(s) Performed: EXCISION MASS, BACK (Left: Back)     Patient location during evaluation: PACU Anesthesia Type: General Level of consciousness: awake Pain management: pain level controlled Vital Signs Assessment: post-procedure vital signs reviewed and stable Respiratory status: spontaneous breathing, nonlabored ventilation and respiratory function stable Cardiovascular status: blood pressure returned to baseline and stable Postop Assessment: no apparent nausea or vomiting Anesthetic complications: no   No notable events documented.  Last Vitals:  Vitals:   09/15/24 1815 09/15/24 1830  BP: (!) 157/59   Pulse: 95 92  Resp: 16 16  Temp:    SpO2: 93% 96%    Last Pain:  Vitals:   09/15/24 1830  TempSrc:   PainSc: 3                  Malia Corsi P Shanique Aslinger

## 2024-09-15 NOTE — Transfer of Care (Signed)
 Immediate Anesthesia Transfer of Care Note  Patient: Diana Parsons  Procedure(s) Performed: EXCISION MASS, BACK (Left: Back)  Patient Location: PACU  Anesthesia Type:General  Level of Consciousness: sedated, patient cooperative, and responds to stimulation  Airway & Oxygen Therapy: Patient Spontanous Breathing and Patient connected to face mask oxygen  Post-op Assessment: Report given to RN and Post -op Vital signs reviewed and stable  Post vital signs: Reviewed and stable  Last Vitals:  Vitals Value Taken Time  BP 163/63 09/15/24 17:54  Temp    Pulse 78 09/15/24 17:55  Resp 11 09/15/24 17:55  SpO2 100 % 09/15/24 17:55  Vitals shown include unfiled device data.  Last Pain:  Vitals:   09/15/24 1415  TempSrc:   PainSc: 0-No pain         Complications: No notable events documented.

## 2024-09-16 ENCOUNTER — Encounter (HOSPITAL_COMMUNITY): Payer: Self-pay | Admitting: General Surgery

## 2024-09-17 ENCOUNTER — Ambulatory Visit: Admitting: Rehabilitative and Restorative Service Providers"

## 2024-09-17 LAB — SURGICAL PATHOLOGY

## 2024-09-21 ENCOUNTER — Ambulatory Visit

## 2024-09-21 DIAGNOSIS — C50511 Malignant neoplasm of lower-outer quadrant of right female breast: Secondary | ICD-10-CM | POA: Diagnosis not present

## 2024-09-21 DIAGNOSIS — M8589 Other specified disorders of bone density and structure, multiple sites: Secondary | ICD-10-CM | POA: Diagnosis not present

## 2024-09-21 DIAGNOSIS — Z79810 Long term (current) use of selective estrogen receptor modulators (SERMs): Secondary | ICD-10-CM | POA: Diagnosis not present

## 2024-09-21 DIAGNOSIS — D696 Thrombocytopenia, unspecified: Secondary | ICD-10-CM | POA: Diagnosis not present

## 2024-09-21 DIAGNOSIS — Z170 Estrogen receptor positive status [ER+]: Secondary | ICD-10-CM | POA: Diagnosis not present

## 2024-09-21 DIAGNOSIS — C50512 Malignant neoplasm of lower-outer quadrant of left female breast: Secondary | ICD-10-CM | POA: Diagnosis not present

## 2024-09-24 ENCOUNTER — Ambulatory Visit

## 2024-09-28 ENCOUNTER — Ambulatory Visit

## 2024-10-01 ENCOUNTER — Ambulatory Visit: Admitting: Rehabilitative and Restorative Service Providers"

## 2024-11-02 ENCOUNTER — Encounter: Payer: Self-pay | Admitting: Family Medicine

## 2024-11-02 ENCOUNTER — Ambulatory Visit: Admitting: Family Medicine

## 2024-11-02 VITALS — BP 120/56 | HR 100 | Ht 66.0 in | Wt 191.1 lb

## 2024-11-02 DIAGNOSIS — R630 Anorexia: Secondary | ICD-10-CM | POA: Diagnosis not present

## 2024-11-02 DIAGNOSIS — E1129 Type 2 diabetes mellitus with other diabetic kidney complication: Secondary | ICD-10-CM

## 2024-11-02 DIAGNOSIS — R809 Proteinuria, unspecified: Secondary | ICD-10-CM | POA: Diagnosis not present

## 2024-11-02 DIAGNOSIS — R110 Nausea: Secondary | ICD-10-CM | POA: Diagnosis not present

## 2024-11-02 DIAGNOSIS — E118 Type 2 diabetes mellitus with unspecified complications: Secondary | ICD-10-CM

## 2024-11-02 LAB — POCT GLYCOSYLATED HEMOGLOBIN (HGB A1C): Hemoglobin A1C: 6.4 % — AB (ref 4.0–5.6)

## 2024-11-02 MED ORDER — SEMAGLUTIDE (1 MG/DOSE) 4 MG/3ML ~~LOC~~ SOPN: 1.0000 mg | PEN_INJECTOR | SUBCUTANEOUS | 1 refills | Status: AC

## 2024-11-02 NOTE — Assessment & Plan Note (Signed)
 Type 2 diabetes mellitus with diabetic kidney complication Type 2 diabetes with good glycemic control (A1c 6.4). Monitoring kidney function due to anti-inflammatory use. - Monitor kidney function with blood tests.

## 2024-11-02 NOTE — Progress Notes (Signed)
 "  Established Patient Office Visit  Patient ID: Diana Parsons, female    DOB: 1934-02-27  Age: 89 y.o. MRN: [id number] PCP: Alvan Dorothyann BIRCH, MD  Chief Complaint  Patient presents with   Diabetes    Subjective:     HPI  Discussed the use of AI scribe software for clinical note transcription with the patient, who gave verbal consent to proceed.  History of Present Illness Diana Parsons is an 89 year old female with breast cancer who presents with persistent nausea and appetite loss.  Nausea and appetite loss - Persistent nausea localized to the stomach, present daily upon waking - Significant decrease in appetite, eating minimally - Food described as unpalatable - Frequently brings food home when dining out - Slight improvement in nausea after reducing tamoxifen dose from 20 mg to 10 mg on January 15, but symptoms persist  Gastrointestinal symptoms - Frequent explosive burping - Sore sensation in the stomach - On omeprazole  and Prilosec for acid reflux - Takes famotidine  (Pepcid )  Breast cancer management - Currently taking tamoxifen, dose reduced from 20 mg to 10 mg since January 15 due to nausea - On Ozempic  (semaglutide ) 2 mg weekly, with three doses remaining before insurance change  Glycemic control - Blood glucose levels stable - Recent hemoglobin A1c of 6.4% - Blood sugar spike after consuming watermelon and grapes at a recent event  Gallbladder disease - History of gallbladder issues managed with dietary modifications - No history of cholecystectomy  Postoperative care (lipoma excision) - Recent surgery for benign lipoma, confirmed by pathology - Applies cream to surgical site on back using a plastic spatula due to limited reach - No issues with the surgical site     ROS    Objective:     BP (!) 120/56   Pulse 100   Ht 5' 6 (1.676 m)   Wt 191 lb 1.9 oz (86.7 kg)   SpO2 98%   BMI 30.85 kg/m    Physical Exam Vitals and nursing note  reviewed.  Constitutional:      Appearance: Normal appearance.  HENT:     Head: Normocephalic and atraumatic.  Eyes:     Conjunctiva/sclera: Conjunctivae normal.  Cardiovascular:     Rate and Rhythm: Normal rate and regular rhythm.  Pulmonary:     Effort: Pulmonary effort is normal.     Breath sounds: Normal breath sounds.  Skin:    General: Skin is warm and dry.  Neurological:     Mental Status: She is alert.  Psychiatric:        Mood and Affect: Mood normal.      Results for orders placed or performed in visit on 11/02/24  POCT HgB A1C  Result Value Ref Range   Hemoglobin A1C 6.4 (A) 4.0 - 5.6 %   HbA1c POC (<> result, manual entry)     HbA1c, POC (prediabetic range)     HbA1c, POC (controlled diabetic range)        The ASCVD Risk score (Arnett DK, et al., 2019) failed to calculate for the following reasons:   The 2019 ASCVD risk score is only valid for ages 7 to 66   * - Cholesterol units were assumed    Assessment & Plan:   Problem List Items Addressed This Visit       Endocrine   Microalbuminuria due to type 2 diabetes mellitus (HCC) - Primary   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 Monitoring kidney function due to anti-inflammatory use. - Monitor kidney function with blood tests.      Relevant Medications   Semaglutide , 1 MG/DOSE, 4 MG/3ML SOPN   Other Relevant Orders   POCT HgB A1C (Completed)   Other Visit Diagnoses       Nausea       Relevant Orders   CMP14+EGFR   Lipase   H. pylori breath test     Decreased appetite           Assessment and Plan Assessment & Plan Chronic nausea and decreased appetite Chronic nausea and decreased appetite, possibly related to semaglutide  and tamoxifen. Differential includes H. pylori, gastritis, esophagitis, or pancreatic inflammation. Previous tamoxifen dose reduction provided relief. - Decrease semaglutide  dose from 2 mg, to  1mg  starting February. - Order H. pylori breath test after 14-day omeprazole  discontinuation. - Check pancreatic function with lipase test. - Consider endoscopy if symptoms persist post-medication adjustment.  Gastroesophageal reflux disease, hx of esophagitis Managed with omeprazole  and famotidine . Omeprazole  to be discontinued for H. pylori testing. - Discontinue omeprazole  for 14 days prior to H. pylori testing. - Continue famotidine  as it does not interfere with H. pylori testing.   Return in about 3 months (around 02/16/2025) for Diabetes follow-up.    Dorothyann Byars, MD [facility] at [facility]   "

## 2024-11-03 ENCOUNTER — Ambulatory Visit: Payer: Self-pay | Admitting: Family Medicine

## 2024-11-03 LAB — CMP14+EGFR
ALT: 12 IU/L (ref 0–32)
AST: 17 IU/L (ref 0–40)
Albumin: 4 g/dL (ref 3.7–4.7)
Alkaline Phosphatase: 83 IU/L (ref 48–129)
BUN/Creatinine Ratio: 23 (ref 12–28)
BUN: 16 mg/dL (ref 8–27)
Bilirubin Total: 0.2 mg/dL (ref 0.0–1.2)
CO2: 22 mmol/L (ref 20–29)
Calcium: 9.3 mg/dL (ref 8.7–10.3)
Chloride: 103 mmol/L (ref 96–106)
Creatinine, Ser: 0.7 mg/dL (ref 0.57–1.00)
Globulin, Total: 2.2 g/dL (ref 1.5–4.5)
Glucose: 106 mg/dL — ABNORMAL HIGH (ref 70–99)
Potassium: 4.6 mmol/L (ref 3.5–5.2)
Sodium: 138 mmol/L (ref 134–144)
Total Protein: 6.2 g/dL (ref 6.0–8.5)
eGFR: 83 mL/min/1.73

## 2024-11-03 LAB — LIPASE: Lipase: 82 U/L (ref 14–85)

## 2024-11-03 NOTE — Progress Notes (Signed)
 Hi Jaylean, metabolic panel looks great.  No sign of pancreatitis.  I think we will move forward with our plan on the dose reduction and see if that helps with some of the nausea you are experiencing.

## 2024-11-14 ENCOUNTER — Other Ambulatory Visit: Payer: Self-pay | Admitting: Family Medicine

## 2024-11-14 DIAGNOSIS — E118 Type 2 diabetes mellitus with unspecified complications: Secondary | ICD-10-CM

## 2025-02-16 ENCOUNTER — Ambulatory Visit: Admitting: Family Medicine

## 2025-03-23 ENCOUNTER — Ambulatory Visit
